# Patient Record
Sex: Female | Born: 1950 | Race: White | Hispanic: No | Smoking: Former smoker
Health system: Southern US, Community
[De-identification: ages and names within clinical notes are randomized; demographics above are authoritative.]

## PROBLEM LIST (undated history)

## (undated) DIAGNOSIS — T4145XA Adverse effect of unspecified anesthetic, initial encounter: Secondary | ICD-10-CM

## (undated) DIAGNOSIS — A159 Respiratory tuberculosis unspecified: Secondary | ICD-10-CM

## (undated) DIAGNOSIS — G629 Polyneuropathy, unspecified: Secondary | ICD-10-CM

## (undated) DIAGNOSIS — J45909 Unspecified asthma, uncomplicated: Secondary | ICD-10-CM

## (undated) DIAGNOSIS — F32A Depression, unspecified: Secondary | ICD-10-CM

## (undated) DIAGNOSIS — R51 Headache: Secondary | ICD-10-CM

## (undated) DIAGNOSIS — D369 Benign neoplasm, unspecified site: Secondary | ICD-10-CM

## (undated) DIAGNOSIS — T8859XA Other complications of anesthesia, initial encounter: Secondary | ICD-10-CM

## (undated) DIAGNOSIS — K449 Diaphragmatic hernia without obstruction or gangrene: Secondary | ICD-10-CM

## (undated) DIAGNOSIS — K219 Gastro-esophageal reflux disease without esophagitis: Secondary | ICD-10-CM

## (undated) DIAGNOSIS — R443 Hallucinations, unspecified: Secondary | ICD-10-CM

## (undated) DIAGNOSIS — R0602 Shortness of breath: Secondary | ICD-10-CM

## (undated) DIAGNOSIS — F419 Anxiety disorder, unspecified: Secondary | ICD-10-CM

## (undated) DIAGNOSIS — F909 Attention-deficit hyperactivity disorder, unspecified type: Secondary | ICD-10-CM

## (undated) DIAGNOSIS — M1990 Unspecified osteoarthritis, unspecified site: Secondary | ICD-10-CM

## (undated) DIAGNOSIS — K5790 Diverticulosis of intestine, part unspecified, without perforation or abscess without bleeding: Secondary | ICD-10-CM

## (undated) DIAGNOSIS — F329 Major depressive disorder, single episode, unspecified: Secondary | ICD-10-CM

## (undated) DIAGNOSIS — I10 Essential (primary) hypertension: Secondary | ICD-10-CM

## (undated) DIAGNOSIS — Z8489 Family history of other specified conditions: Secondary | ICD-10-CM

## (undated) HISTORY — DX: Unspecified asthma, uncomplicated: J45.909

## (undated) HISTORY — DX: Essential (primary) hypertension: I10

## (undated) HISTORY — PX: NOSE SURGERY: SHX723

## (undated) HISTORY — DX: Benign neoplasm, unspecified site: D36.9

## (undated) HISTORY — PX: TONSILLECTOMY: SUR1361

## (undated) HISTORY — PX: CARDIAC CATHETERIZATION: SHX172

## (undated) HISTORY — DX: Unspecified osteoarthritis, unspecified site: M19.90

## (undated) HISTORY — PX: OTHER SURGICAL HISTORY: SHX169

## (undated) HISTORY — DX: Diaphragmatic hernia without obstruction or gangrene: K44.9

## (undated) HISTORY — DX: Hallucinations, unspecified: R44.3

## (undated) HISTORY — DX: Depression, unspecified: F32.A

## (undated) HISTORY — DX: Anxiety disorder, unspecified: F41.9

## (undated) HISTORY — PX: SPINE SURGERY: SHX786

## (undated) HISTORY — DX: Major depressive disorder, single episode, unspecified: F32.9

## (undated) HISTORY — PX: JOINT REPLACEMENT: SHX530

## (undated) HISTORY — PX: TOE SURGERY: SHX1073

## (undated) HISTORY — DX: Diverticulosis of intestine, part unspecified, without perforation or abscess without bleeding: K57.90

## (undated) HISTORY — PX: CHOLECYSTECTOMY: SHX55

## (undated) HISTORY — PX: ABDOMINAL HYSTERECTOMY: SHX81

## (undated) HISTORY — PX: REPLACEMENT TOTAL KNEE: SUR1224

---

## 1996-09-26 HISTORY — PX: BREAST BIOPSY: SHX20

## 1999-08-18 ENCOUNTER — Encounter: Payer: Self-pay | Admitting: *Deleted

## 1999-08-18 ENCOUNTER — Emergency Department (HOSPITAL_COMMUNITY): Admission: EM | Admit: 1999-08-18 | Discharge: 1999-08-18 | Payer: Self-pay | Admitting: *Deleted

## 2000-08-16 ENCOUNTER — Encounter: Payer: Self-pay | Admitting: Family Medicine

## 2000-08-16 ENCOUNTER — Ambulatory Visit (HOSPITAL_COMMUNITY): Admission: RE | Admit: 2000-08-16 | Discharge: 2000-08-16 | Payer: Self-pay | Admitting: Family Medicine

## 2001-01-16 ENCOUNTER — Encounter: Payer: Self-pay | Admitting: Family Medicine

## 2001-01-16 ENCOUNTER — Encounter: Admission: RE | Admit: 2001-01-16 | Discharge: 2001-01-16 | Payer: Self-pay | Admitting: Family Medicine

## 2001-01-23 ENCOUNTER — Encounter: Payer: Self-pay | Admitting: Family Medicine

## 2001-01-23 ENCOUNTER — Encounter: Admission: RE | Admit: 2001-01-23 | Discharge: 2001-01-23 | Payer: Self-pay | Admitting: Family Medicine

## 2001-01-31 ENCOUNTER — Encounter: Admission: RE | Admit: 2001-01-31 | Discharge: 2001-01-31 | Payer: Self-pay | Admitting: General Surgery

## 2001-01-31 ENCOUNTER — Encounter: Payer: Self-pay | Admitting: General Surgery

## 2001-02-12 ENCOUNTER — Observation Stay (HOSPITAL_COMMUNITY): Admission: RE | Admit: 2001-02-12 | Discharge: 2001-02-13 | Payer: Self-pay | Admitting: General Surgery

## 2001-02-12 ENCOUNTER — Encounter (INDEPENDENT_AMBULATORY_CARE_PROVIDER_SITE_OTHER): Payer: Self-pay

## 2001-11-29 ENCOUNTER — Encounter: Admission: RE | Admit: 2001-11-29 | Discharge: 2001-11-29 | Payer: Self-pay | Admitting: Family Medicine

## 2001-11-29 ENCOUNTER — Encounter: Payer: Self-pay | Admitting: Family Medicine

## 2003-05-12 ENCOUNTER — Encounter: Admission: RE | Admit: 2003-05-12 | Discharge: 2003-08-10 | Payer: Self-pay | Admitting: *Deleted

## 2003-05-25 ENCOUNTER — Encounter: Payer: Self-pay | Admitting: Family Medicine

## 2003-05-25 ENCOUNTER — Encounter: Admission: RE | Admit: 2003-05-25 | Discharge: 2003-05-25 | Payer: Self-pay | Admitting: Family Medicine

## 2005-03-30 ENCOUNTER — Other Ambulatory Visit: Admission: RE | Admit: 2005-03-30 | Discharge: 2005-03-30 | Payer: Self-pay | Admitting: *Deleted

## 2005-03-31 ENCOUNTER — Encounter: Admission: RE | Admit: 2005-03-31 | Discharge: 2005-03-31 | Payer: Self-pay | Admitting: *Deleted

## 2006-01-04 ENCOUNTER — Ambulatory Visit (HOSPITAL_COMMUNITY): Admission: RE | Admit: 2006-01-04 | Discharge: 2006-01-04 | Payer: Self-pay | Admitting: *Deleted

## 2006-01-10 ENCOUNTER — Emergency Department (HOSPITAL_COMMUNITY): Admission: EM | Admit: 2006-01-10 | Discharge: 2006-01-11 | Payer: Self-pay | Admitting: Emergency Medicine

## 2006-01-24 ENCOUNTER — Ambulatory Visit: Payer: Self-pay | Admitting: Internal Medicine

## 2006-02-22 ENCOUNTER — Ambulatory Visit: Payer: Self-pay | Admitting: Internal Medicine

## 2008-02-28 ENCOUNTER — Telehealth: Payer: Self-pay | Admitting: Internal Medicine

## 2008-03-13 ENCOUNTER — Ambulatory Visit: Payer: Self-pay | Admitting: Internal Medicine

## 2008-03-20 ENCOUNTER — Telehealth (INDEPENDENT_AMBULATORY_CARE_PROVIDER_SITE_OTHER): Payer: Self-pay | Admitting: *Deleted

## 2008-03-21 ENCOUNTER — Ambulatory Visit (HOSPITAL_COMMUNITY): Admission: RE | Admit: 2008-03-21 | Discharge: 2008-03-21 | Payer: Self-pay | Admitting: Internal Medicine

## 2008-03-21 ENCOUNTER — Ambulatory Visit: Payer: Self-pay | Admitting: Internal Medicine

## 2008-03-21 DIAGNOSIS — D369 Benign neoplasm, unspecified site: Secondary | ICD-10-CM

## 2008-03-21 HISTORY — DX: Benign neoplasm, unspecified site: D36.9

## 2008-03-24 ENCOUNTER — Ambulatory Visit: Payer: Self-pay | Admitting: Internal Medicine

## 2008-12-05 ENCOUNTER — Encounter: Admission: RE | Admit: 2008-12-05 | Discharge: 2008-12-05 | Payer: Self-pay | Admitting: Otolaryngology

## 2009-03-17 ENCOUNTER — Emergency Department (HOSPITAL_BASED_OUTPATIENT_CLINIC_OR_DEPARTMENT_OTHER): Admission: EM | Admit: 2009-03-17 | Discharge: 2009-03-17 | Payer: Self-pay | Admitting: Emergency Medicine

## 2009-05-10 ENCOUNTER — Emergency Department (HOSPITAL_BASED_OUTPATIENT_CLINIC_OR_DEPARTMENT_OTHER): Admission: EM | Admit: 2009-05-10 | Discharge: 2009-05-11 | Payer: Self-pay | Admitting: Emergency Medicine

## 2009-05-11 ENCOUNTER — Ambulatory Visit: Payer: Self-pay | Admitting: Diagnostic Radiology

## 2009-06-11 DIAGNOSIS — I10 Essential (primary) hypertension: Secondary | ICD-10-CM

## 2009-06-11 DIAGNOSIS — E785 Hyperlipidemia, unspecified: Secondary | ICD-10-CM

## 2009-06-11 HISTORY — DX: Essential (primary) hypertension: I10

## 2009-06-11 HISTORY — DX: Hyperlipidemia, unspecified: E78.5

## 2009-06-12 ENCOUNTER — Ambulatory Visit: Payer: Self-pay | Admitting: Pulmonary Disease

## 2009-06-12 DIAGNOSIS — R05 Cough: Secondary | ICD-10-CM

## 2009-06-12 DIAGNOSIS — J309 Allergic rhinitis, unspecified: Secondary | ICD-10-CM

## 2009-06-12 DIAGNOSIS — R519 Headache, unspecified: Secondary | ICD-10-CM | POA: Insufficient documentation

## 2009-06-12 DIAGNOSIS — R51 Headache: Secondary | ICD-10-CM | POA: Insufficient documentation

## 2009-06-12 DIAGNOSIS — R059 Cough, unspecified: Secondary | ICD-10-CM | POA: Insufficient documentation

## 2009-06-12 HISTORY — DX: Allergic rhinitis, unspecified: J30.9

## 2009-06-12 HISTORY — DX: Cough, unspecified: R05.9

## 2009-06-26 ENCOUNTER — Ambulatory Visit: Payer: Self-pay | Admitting: Pulmonary Disease

## 2009-06-26 DIAGNOSIS — R0609 Other forms of dyspnea: Secondary | ICD-10-CM | POA: Insufficient documentation

## 2009-06-26 DIAGNOSIS — R0989 Other specified symptoms and signs involving the circulatory and respiratory systems: Secondary | ICD-10-CM | POA: Insufficient documentation

## 2009-06-26 HISTORY — DX: Other forms of dyspnea: R09.89

## 2009-06-26 HISTORY — DX: Other forms of dyspnea: R06.09

## 2009-07-01 ENCOUNTER — Encounter: Payer: Self-pay | Admitting: Pulmonary Disease

## 2009-07-10 ENCOUNTER — Ambulatory Visit (HOSPITAL_COMMUNITY): Admission: RE | Admit: 2009-07-10 | Discharge: 2009-07-10 | Payer: Self-pay | Admitting: Pulmonary Disease

## 2009-07-20 ENCOUNTER — Ambulatory Visit: Payer: Self-pay | Admitting: Pulmonary Disease

## 2009-07-29 ENCOUNTER — Emergency Department (HOSPITAL_BASED_OUTPATIENT_CLINIC_OR_DEPARTMENT_OTHER): Admission: EM | Admit: 2009-07-29 | Discharge: 2009-07-29 | Payer: Self-pay | Admitting: Emergency Medicine

## 2009-07-29 ENCOUNTER — Ambulatory Visit: Payer: Self-pay | Admitting: Diagnostic Radiology

## 2009-07-31 ENCOUNTER — Encounter: Payer: Self-pay | Admitting: Pulmonary Disease

## 2009-08-13 ENCOUNTER — Encounter: Payer: Self-pay | Admitting: Pulmonary Disease

## 2009-08-14 ENCOUNTER — Telehealth: Payer: Self-pay | Admitting: Pulmonary Disease

## 2009-08-26 ENCOUNTER — Telehealth: Payer: Self-pay | Admitting: Pulmonary Disease

## 2009-08-27 ENCOUNTER — Encounter: Payer: Self-pay | Admitting: Pulmonary Disease

## 2009-08-28 ENCOUNTER — Telehealth: Payer: Self-pay | Admitting: Pulmonary Disease

## 2009-09-01 ENCOUNTER — Encounter (INDEPENDENT_AMBULATORY_CARE_PROVIDER_SITE_OTHER): Payer: Self-pay | Admitting: *Deleted

## 2009-10-19 ENCOUNTER — Ambulatory Visit (HOSPITAL_COMMUNITY): Admission: RE | Admit: 2009-10-19 | Discharge: 2009-10-19 | Payer: Self-pay | Admitting: Surgery

## 2009-10-20 ENCOUNTER — Ambulatory Visit (HOSPITAL_COMMUNITY): Admission: RE | Admit: 2009-10-20 | Discharge: 2009-10-20 | Payer: Self-pay | Admitting: Gastroenterology

## 2009-11-06 ENCOUNTER — Ambulatory Visit (HOSPITAL_COMMUNITY): Admission: RE | Admit: 2009-11-06 | Discharge: 2009-11-06 | Payer: Self-pay | Admitting: Cardiology

## 2009-11-11 ENCOUNTER — Encounter: Admission: RE | Admit: 2009-11-11 | Discharge: 2009-11-11 | Payer: Self-pay | Admitting: Family Medicine

## 2010-09-03 ENCOUNTER — Inpatient Hospital Stay (HOSPITAL_COMMUNITY)
Admission: RE | Admit: 2010-09-03 | Discharge: 2010-09-06 | Payer: Self-pay | Source: Home / Self Care | Attending: Orthopedic Surgery | Admitting: Orthopedic Surgery

## 2010-10-17 ENCOUNTER — Encounter: Payer: Self-pay | Admitting: Surgery

## 2010-10-26 NOTE — Progress Notes (Signed)
Summary: results  Phone Note Call from Patient Call back at Home Phone [phone]   Caller: Patient Call For: clance Reason for Call: Talk to Nurse, Talk to Doctor Summary of Call: will have her phone with her all day today.  Please have KC call her about results. Initial call taken by: Eugene Gavia,  August 28, 2009 8:23 AM  Follow-up for Phone Call        pt states she will have her phone with her all day and you can reach her on number listed--[phone] Follow-up by: Philipp Deputy CMA,  August 28, 2009 11:40 AM  Additional Follow-up for Phone Call Additional follow up Details #1::        finally got to speak with pt.  Went over test results, and explained no pulmonary finding to explain her sob.  she is seeing gi to address probable reflux induced cough. Additional Follow-up by: Barbaraann Share MD,  August 28, 2009 4:35 PM

## 2010-10-26 NOTE — Letter (Signed)
Summary: Out of Work  [employer]  [address]   [place], [place] [address]   Phone: [phone]  Fax: [phone]    June 12, 2009   Employee:  JOHNNA BOLLIER Berkey    To Whom It May Concern:   For Medical reasons, please excuse the above named employee from work for the following dates:  Start:   Friday Sept 17, 2010  End:   Friday Sept 17, 2010  If you need additional information, please feel free to contact our office.         Sincerely,    Marcelyn Bruins, M.D.

## 2010-10-26 NOTE — Miscellaneous (Signed)
Summary: Orders Update pft charges  Clinical Lists Changes  Orders: Added new Service order of Carbon Monoxide diffusing w/capacity (94720) - Signed Added new Service order of Lung Volumes (94240) - Signed Added new Service order of Spirometry (Pre & Post) (94060) - Signed 

## 2010-10-26 NOTE — Progress Notes (Signed)
Summary: talk to dr clance  Phone Note Call from Patient   Caller: Patient Call For: clance Summary of Call: returning dr clance call Initial call taken by: Rickard Patience,  August 26, 2009 3:02 PM  Follow-up for Phone Call        Pt states she is returning Dr. Teddy Spike call.  Please advise.  Aundra Millet Reynolds LPN  August 26, 2009 3:03 PM   Additional Follow-up for Phone Call Additional follow up Details #1::        called pt and left message on machine that I called. Additional Follow-up by: Barbaraann Share MD,  August 26, 2009 6:04 PM

## 2010-10-26 NOTE — Letter (Signed)
Summary: [facility] Results Follow Up Letter  [facility]  [address]   [place], [place] [address]   Phone: [phone]  Fax: [phone]    09/01/2009 MRN: [id number]  Kortney Gedeon [address] [place], [place]  [address]  Dear Ms. Storie,  We have received the results from your recent tests and have been unable to contact you.  Please call our office at [phone] so that Dr.Clance or his nurse may review the results with you.    Thank you,  Nature conservation officer Pulmonary Division

## 2010-10-26 NOTE — Procedures (Signed)
Summary: Colonoscopy   Colonoscopy  Procedure date:  03/21/2008  Findings:      Location:  [facility].    Patient Name: Pamela, Sims MRN:  Procedure Procedures: Colonoscopy CPT: 09811.  Personnel: Endoscopist: Dora L. Juanda Chance, MD.  Exam Location: Exam performed in Endoscopy Suite.  Patient Consent: Procedure, Alternatives, Risks and Benefits discussed, consent obtained, from patient. Consent was obtained by the RN.  Indications  Surveillance of: Adenomatous Polyp(s). Initial polypectomy was performed in 2004. 1-2 Polyps were found at Index Exam. Prior polyp located in proximal (splenic flexure and beyond) colon. Pathology of worst  polyp: tubular adenoma.  History  Current Medications: Patient is not currently taking Coumadin.  Pre-Exam Physical: Performed Mar 21, 2008. Cardio-pulmonary exam, Rectal exam, HEENT exam , Abdominal exam, Extremity exam, Neurological exam, Mental status exam WNL.  Comments: Pt. history reviewed/updated, physical exam performed prior to initiation of sedation?yes Exam Exam: Extent of exam reached: Cecum, extent intended: Cecum.  The cecum was identified by appendiceal orifice and IC valve. Colon retroflexion performed. Images taken. ASA Classification: II. Tolerance: good.  Monitoring: Pulse and BP monitoring, Oximetry used. Supplemental O2 given.  Colon Prep Used Miralax for colon prep. Prep results: good.  Sedation Meds: Patient assessed and found to be appropriate for moderate (conscious) sedation. Fentanyl 60 given IV. Versed 6 mg. given IV.  Findings - NORMAL EXAM: Cecum. Comments: no recurrent polyp.  - DIVERTICULOSIS: Sigmoid Colon. ICD9: Diverticulosis, Colon: 562.10. Comments: mild sigmoid diverticulosis.  - NORMAL EXAM: Rectum.   Assessment Normal examination.  Diagnoses: 562.10: Diverticulosis, Colon.   Comments: no recurrent polyps Events  Unplanned Interventions: No intervention was required.    Unplanned Events: There were no complications. Plans Patient Education: Patient given standard instructions for: Patient instructed to get routine colonoscopy every 7-10 years.  Comments: stool cards every 2-3 years Disposition: After procedure patient sent to recovery.     This report was created from the original endoscopy report, which was reviewed and signed by the above listed endoscopist.     Appended Document: Colonoscopy Recall in IDX for 02/2015.

## 2010-10-26 NOTE — Letter (Signed)
Summary: [facility]  [facility]   Imported By: Maryln Gottron 09/01/2009 15:20:28  _____________________________________________________________________  External Attachment:    Type:   Image     Comment:   External Document

## 2010-10-26 NOTE — Miscellaneous (Signed)
Summary: pfts normal  Clinical Lists Changes  pfts normal.  Still have not gotten ENT eval of upper airway.  Called and left message at home number to call us with a number where she can be reached easily during day , and I will return her call.  Appended Document: pfts normal megan, please see if byers saw this pt, and have note faxed over.  thanks.  Appended Document: pfts normal received records and put in [facility] very important look at folder.   Appended Document: pfts normal reviewed and sent for scanning.

## 2010-10-26 NOTE — Progress Notes (Signed)
Summary: Procedure date changed  Phone Note Call from Patient Call back at Home Phone [phone]   Caller: Patient Call For: [name] Reason for Call: Talk to Nurse Summary of Call: [name] PT.  WANTS TO CHANGE FROM [name] TO ANY OTHER DR DUE TO [name]'S LIMITED SCHEDULE AVAILABILITY.  OK?  PT NEEDS RECALL COL DONE PRIOR TO JULY 1 DUE TO INSURANCE.  PATIENT'S CHART HAS BEEN REQUESTED Initial call taken by: Magdalen Spatz [facility],  February 28, 2008 11:28 AM  Follow-up for Phone Call        Mesage left for patient to callback. Laureen Ochs LPN  February 28, 1307 11:40 AM  PT RETURNED DEBORAH'S CALL.  DEBORAH NOT AVAILABLE.  PLEASE CALL PT AGAIN ON CELL PHONE.  SHE IS ON HER WAY TO ANOTHER DR APPT IF NO ANSWER, LEAVE ANOTHER MESSAGE PLEASE.  Magdalen Spatz [facility]  February 28, 2008 3:04 PM  Mesage left for patient to callback. Laureen Ochs LPN  February 27, 6577 3:11 PM   Follow-up by: Laureen Ochs LPN,  February 28, 4695 4:32 PM  Additional Follow-up for Phone Call Additional follow up Details #1::        Doesn't really want to change MD's, just needs her colon done before 03-26-08. Appt. rescheduled with pt. and she is satisified. 1) pre visit   03-13-08 at 4pm 2) colon [facility] 03-21-08 at 10am 3) Pt. instructed to call back as needed.  Additional Follow-up by: Laureen Ochs LPN,  February 28, 2951 4:46 PM

## 2010-10-26 NOTE — Progress Notes (Signed)
Summary: pt returned call x 2  Phone Note Call from Patient   Caller: Patient Call For: clance Summary of Call: pt returned call from kc. left phone contact info. call after 3pm: [phone] Initial call taken by: Tivis Ringer,  August 14, 2009 10:11 AM  Follow-up for Phone Call        pt called back again re: results. Tivis Ringer  August 17, 2009 2:28 PM  Additional Follow-up for Phone Call Additional follow up Details #1::        called pt again and lmom will try to call back. Additional Follow-up by: Barbaraann Share MD,  August 25, 2009 10:00 AM

## 2010-10-26 NOTE — Assessment & Plan Note (Signed)
Summary: consult for chronic cough   Copy to:  Merri Brunette Primary Provider/Referring Provider:  Renato Gails  CC:  Pulmonary Consult.  History of Present Illness: The pt is a 60 y/o female who I have been asked to see for chronic cough.  She developed a dry hacking cough in June of this year, and it has worsened to the point of causing sob.  She was in the ER one month ago with cough, sob, and myalgias.  Cxr at the time unremarkable, and ct chest unremarkable as well.  Given a neb treatment, and sent home with dx of "acute viral illness".  She was seen  by Dr. Katrinka Blazing with persistent cough, and was also having sob to the point that she would get winded walking down the hall at school and also with tallking.  She was treated with a zpak, and also started on qday nexium for reflux and ongoing GI issues.  Her symptoms did not improve, and therefore was given a trial of advair, that she has taken for 3 days only.  Has not seen a considerable difference.  Currently, the pt has sob just walking short distances, and feels a fullness in her throat.  The cough has continued, and is dry and barking in nature.  The cough is worse with excessive voice use, and she admits to frequent throat clearing.  She has been off her reflux meds for 2 weeks and denies gerd symptoms, but has been nauseated.  She denies any ongoing sinus symptoms, but did have ct sinuses in 2007 with minimal right maxillary mucosal thickening.  She has been on an ACE inhibitor in the past, but quit taking the beginning of august.  Current Medications (verified): 1)  None  Allergies (verified): No Known Drug Allergies  Past History:  Past Medical History:  HEADACHE, CHRONIC (ICD-784.0) ALLERGIC RHINITIS (ICD-477.9) HYPERLIPIDEMIA (ICD-272.4) HYPERTENSION (ICD-401.9)    Past Surgical History: sinus surgery- growth removed 1996 cholecystectomy 2003 hysterectomy 1997 benign breast biopsy neck surgery - c5 & c6 1995 tonsillectomy at age  10 L foot surgery 2003 L knee surgery 2009  Family History: Reviewed history from 06/11/2009 and no changes required. emphysema: mother asthma: daughters, grandson, brother heart disease: maternal grandfather cancer: maternal grandmother (breast)  Social History: Reviewed history from 06/11/2009 and no changes required. Patient states former smoker. quit 1977. started at age 51.  2 to 3 ppd. pt is divorced and now single. pt has children. pt works as a Runner, broadcasting/film/video.   Review of Systems       The patient complains of shortness of breath with activity, shortness of breath at rest, non-productive cough, chest pain, indigestion, weight change, abdominal pain, difficulty swallowing, sore throat, tooth/dental problems, headaches, ear ache, anxiety, hand/feet swelling, and joint stiffness or pain.  The patient denies productive cough, coughing up blood, irregular heartbeats, acid heartburn, loss of appetite, nasal congestion/difficulty breathing through nose, sneezing, itching, depression, rash, change in color of mucus, and fever.    Vital Signs:  Patient profile:   60 year old female Height:      67 inches Weight:      245.25 pounds BMI:     38.55 O2 Sat:      96 % on Room air Temp:     97.7 degrees F oral Pulse rate:   76 / minute BP sitting:   160 / 98  (left arm) Cuff size:   large  Vitals Entered By: Arman Filter LPN (June 12, 2009 9:27 AM)  O2 Flow:  Room air CC: Pulmonary Consult Comments Medications reviewed with patient Arman Filter LPN  June 12, 2009 9:27 AM    Physical Exam  General:  ow female in nad Eyes:  PERRLA and EOMI.   Nose:  patent without discharge Mouth:  clear Neck:  no jvd, tmg, LN Lungs:  clear to auscultation no wheezing or rhonchi Heart:  rrr, no mrg Abdomen:  soft and nontender, bs+ Extremities:  no edema noted, pulses intact distally Neurologic:  alert and oriented, moves all 4.    Pulmonary Function Test Date:  06/12/2009 Gender: Female  Pre-Spirometry FVC    Value: 2.76 L/min   Pred: 3.51 L/min     % Pred: 78 % FEV1    Value: 2.09 L     Pred: 2.63 L     % Pred: 79 % FEV1/FVC  Value: 76 %     Evaluation: normal  Impression & Recommendations:  Problem # 1:  COUGH, CHRONIC (ICD-786.2) the pt has a chronic cough that I suspect is upper airway in origin.  It has cyclical features, and may be driven by ongoing reflux.  I do not think she has a pulmonary etiology for this, given her normal cxr, normal lung exam,  and normal spirometry today in the office.  I would like to treat her with the cyclical cough protocol to allow her upper airway inflammation to resolve, but would also like to restart high dose PPI therapy short term to help with reflux.  I am not sure how her dyspnea fits in with all of this.  If continues even though her cough resolves, may need full pfts and screening cardiac evaluation.  Medications Added to Medication List This Visit: 1)  Tussicaps 10-8 Mg Xr12h-cap (Hydrocod polst-chlorphen polst) .... One in am and pm 2)  Tessalon Perles 100 Mg Caps (Benzonatate) .... One to two by mouth 3-4 times daily 3)  Nexium 40 Mg Cpdr (Esomeprazole magnesium) .... One by mouth in am and pm  Other Orders: Consultation Level IV (47829) Spirometry w/Graph (94010)  Patient Instructions: 1)  see cyclical cough protocol 2)  nexium 40mg  am and pm for next 4 weeks 3)  followup with me in 2 weeks  Prescriptions: NEXIUM 40 MG  CPDR (ESOMEPRAZOLE MAGNESIUM) one by mouth in am and pm  #60 x 1   Entered and Authorized by:   Barbaraann Share MD   Signed by:   Barbaraann Share MD on 06/12/2009   Method used:   Print then Give to Patient   RxID:   [id number] TESSALON PERLES 100 MG  CAPS (BENZONATATE) One to two by mouth 3-4 times daily  #30 x 1   Entered and Authorized by:   Barbaraann Share MD   Signed by:   Barbaraann Share MD on 06/12/2009   Method used:   Print then Give to Patient   RxID:    [id number] TUSSICAPS 10-8 MG XR12H-CAP (HYDROCOD POLST-CHLORPHEN POLST) one in am and pm  #12 x 0   Entered and Authorized by:   Barbaraann Share MD   Signed by:   Barbaraann Share MD on 06/12/2009   Method used:   Print then Give to Patient   RxID:   [id number]

## 2010-10-26 NOTE — Miscellaneous (Signed)
Summary: Orders Update  Clinical Lists Changes  Orders: Added new Service order of No Show NS50 (NS50) - Signed 

## 2010-10-26 NOTE — Miscellaneous (Signed)
Summary: [facility] Previsit/prep  Clinical Lists Changes  Medications: Added new medication of MIRALAX   POWD (POLYETHYLENE GLYCOL 3350) As per prep  instructions. - Signed Added new medication of METOCLOPRAMIDE HCL 10 MG  TABS (METOCLOPRAMIDE HCL) As per prep instructions. - Signed Added new medication of DULCOLAX 5 MG  TBEC (BISACODYL) Day before procedure take 2 at 3pm and 2 at 8pm. - Signed Rx of MIRALAX   POWD (POLYETHYLENE GLYCOL 3350) As per prep  instructions.;  #255gm x 0;  Signed;  Entered by: Wyona Almas RN;  Authorized by: Hart Carwin MD;  Method used: Electronic Rx of METOCLOPRAMIDE HCL 10 MG  TABS (METOCLOPRAMIDE HCL) As per prep instructions.;  #2 x 0;  Signed;  Entered by: Wyona Almas RN;  Authorized by: Hart Carwin MD;  Method used: Electronic Rx of DULCOLAX 5 MG  TBEC (BISACODYL) Day before procedure take 2 at 3pm and 2 at 8pm.;  #4 x 0;  Signed;  Entered by: Wyona Almas RN;  Authorized by: Hart Carwin MD;  Method used: Electronic Observations: Added new observation of NKA: T (03/13/2008 16:08)    Prescriptions: DULCOLAX 5 MG  TBEC (BISACODYL) Day before procedure take 2 at 3pm and 2 at 8pm.  #4 x 0   Entered by:   Wyona Almas RN   Authorized by:   Hart Carwin MD   Signed by:   Wyona Almas RN on 03/13/2008   Method used:   Electronically sent to ...       [facility] [phone][facility]       [address]       [place], [place]  [address]       Ph: [phone]       Fax: [phone]   RxID:   [id number] METOCLOPRAMIDE HCL 10 MG  TABS (METOCLOPRAMIDE HCL) As per prep instructions.  #2 x 0   Entered by:   Wyona Almas RN   Authorized by:   Hart Carwin MD   Signed by:   Wyona Almas RN on 03/13/2008   Method used:   Electronically sent to ...       [facility] [phone]*       [address]       [place], [place]  [address]       Ph: [phone]       Fax: [phone]   RxID:    [id number] MIRALAX   POWD (POLYETHYLENE GLYCOL 3350) As per prep  instructions.  #255gm x 0   Entered by:   Wyona Almas RN   Authorized by:   Hart Carwin MD   Signed by:   Wyona Almas RN on 03/13/2008   Method used:   Electronically sent to ...       [facility] [phone]*       [address]       [place], [place]  [address]       Ph: [phone]       Fax: [phone]   RxID:   [id number]

## 2010-12-07 LAB — DIFFERENTIAL
Basophils Absolute: 0 10*3/uL (ref 0.0–0.1)
Basophils Relative: 1 % (ref 0–1)
Eosinophils Absolute: 0.1 10*3/uL (ref 0.0–0.7)
Eosinophils Relative: 1 % (ref 0–5)
Lymphocytes Relative: 17 % (ref 12–46)
Lymphs Abs: 1.1 10*3/uL (ref 0.7–4.0)
Monocytes Absolute: 0.4 10*3/uL (ref 0.1–1.0)
Monocytes Relative: 6 % (ref 3–12)
Neutro Abs: 5 10*3/uL (ref 1.7–7.7)
Neutrophils Relative %: 76 % (ref 43–77)

## 2010-12-07 LAB — CBC
HCT: 31.4 % — ABNORMAL LOW (ref 36.0–46.0)
HCT: 33.2 % — ABNORMAL LOW (ref 36.0–46.0)
HCT: 35.9 % — ABNORMAL LOW (ref 36.0–46.0)
HCT: 40.4 % (ref 36.0–46.0)
Hemoglobin: 11 g/dL — ABNORMAL LOW (ref 12.0–15.0)
Hemoglobin: 11.6 g/dL — ABNORMAL LOW (ref 12.0–15.0)
Hemoglobin: 12.5 g/dL (ref 12.0–15.0)
Hemoglobin: 13.9 g/dL (ref 12.0–15.0)
MCH: 29.2 pg (ref 26.0–34.0)
MCH: 29.4 pg (ref 26.0–34.0)
MCH: 29.5 pg (ref 26.0–34.0)
MCH: 29.5 pg (ref 26.0–34.0)
MCHC: 34.4 g/dL (ref 30.0–36.0)
MCHC: 34.8 g/dL (ref 30.0–36.0)
MCHC: 34.9 g/dL (ref 30.0–36.0)
MCHC: 35 g/dL (ref 30.0–36.0)
MCV: 84.2 fL (ref 78.0–100.0)
MCV: 84.3 fL (ref 78.0–100.0)
MCV: 84.7 fL (ref 78.0–100.0)
MCV: 84.9 fL (ref 78.0–100.0)
Platelets: 132 10*3/uL — ABNORMAL LOW (ref 150–400)
Platelets: 133 10*3/uL — ABNORMAL LOW (ref 150–400)
Platelets: 147 10*3/uL — ABNORMAL LOW (ref 150–400)
Platelets: 155 10*3/uL (ref 150–400)
RBC: 3.73 MIL/uL — ABNORMAL LOW (ref 3.87–5.11)
RBC: 3.94 MIL/uL (ref 3.87–5.11)
RBC: 4.24 MIL/uL (ref 3.87–5.11)
RBC: 4.76 MIL/uL (ref 3.87–5.11)
RDW: 13.2 % (ref 11.5–15.5)
RDW: 13.4 % (ref 11.5–15.5)
RDW: 13.4 % (ref 11.5–15.5)
RDW: 13.4 % (ref 11.5–15.5)
WBC: 6.7 10*3/uL (ref 4.0–10.5)
WBC: 8.9 10*3/uL (ref 4.0–10.5)
WBC: 9.2 10*3/uL (ref 4.0–10.5)
WBC: 9.8 10*3/uL (ref 4.0–10.5)

## 2010-12-07 LAB — BASIC METABOLIC PANEL
BUN: 10 mg/dL (ref 6–23)
BUN: 11 mg/dL (ref 6–23)
BUN: 14 mg/dL (ref 6–23)
BUN: 7 mg/dL (ref 6–23)
CO2: 29 mEq/L (ref 19–32)
CO2: 29 mEq/L (ref 19–32)
CO2: 30 mEq/L (ref 19–32)
CO2: 31 mEq/L (ref 19–32)
Calcium: 8.1 mg/dL — ABNORMAL LOW (ref 8.4–10.5)
Calcium: 8.6 mg/dL (ref 8.4–10.5)
Calcium: 8.9 mg/dL (ref 8.4–10.5)
Calcium: 9.6 mg/dL (ref 8.4–10.5)
Chloride: 102 mEq/L (ref 96–112)
Chloride: 104 mEq/L (ref 96–112)
Chloride: 96 mEq/L (ref 96–112)
Chloride: 99 mEq/L (ref 96–112)
Creatinine, Ser: 0.8 mg/dL (ref 0.4–1.2)
Creatinine, Ser: 0.88 mg/dL (ref 0.4–1.2)
Creatinine, Ser: 0.92 mg/dL (ref 0.4–1.2)
Creatinine, Ser: 0.93 mg/dL (ref 0.4–1.2)
GFR calc Af Amer: >60 mL/min (ref >60)
GFR calc Af Amer: >60 mL/min (ref >60)
GFR calc Af Amer: >60 mL/min (ref >60)
GFR calc Af Amer: >60 mL/min (ref >60)
GFR calc non Af Amer: >60 mL/min (ref >60)
GFR calc non Af Amer: >60 mL/min (ref >60)
GFR calc non Af Amer: >60 mL/min (ref >60)
GFR calc non Af Amer: >60 mL/min (ref >60)
Glucose, Bld: 103 mg/dL — ABNORMAL HIGH (ref 70–99)
Glucose, Bld: 112 mg/dL — ABNORMAL HIGH (ref 70–99)
Glucose, Bld: 122 mg/dL — ABNORMAL HIGH (ref 70–99)
Glucose, Bld: 131 mg/dL — ABNORMAL HIGH (ref 70–99)
Potassium: 3.1 mEq/L — ABNORMAL LOW (ref 3.5–5.1)
Potassium: 3.9 mEq/L (ref 3.5–5.1)
Potassium: 4 mEq/L (ref 3.5–5.1)
Potassium: 4.7 mEq/L (ref 3.5–5.1)
Sodium: 135 mEq/L (ref 135–145)
Sodium: 136 mEq/L (ref 135–145)
Sodium: 137 mEq/L (ref 135–145)
Sodium: 143 mEq/L (ref 135–145)

## 2010-12-07 LAB — URINALYSIS, ROUTINE W REFLEX MICROSCOPIC
Bilirubin Urine: NEGATIVE
Glucose, UA: NEGATIVE mg/dL
Hgb urine dipstick: NEGATIVE
Ketones, ur: NEGATIVE mg/dL
Nitrite: NEGATIVE
Protein, ur: NEGATIVE mg/dL
Specific Gravity, Urine: 1.016 (ref 1.005–1.030)
Urobilinogen, UA: 0.2 mg/dL (ref 0.0–1.0)
pH: 6 (ref 5.0–8.0)

## 2010-12-07 LAB — PROTIME-INR
INR: 1 (ref 0.00–1.49)
Prothrombin Time: 13.4 seconds (ref 11.6–15.2)

## 2010-12-07 LAB — TYPE AND SCREEN
ABO/RH(D): A POS
Antibody Screen: NEGATIVE

## 2010-12-07 LAB — ABO/RH: ABO/RH(D): A POS

## 2010-12-07 LAB — SURGICAL PCR SCREEN
MRSA, PCR: NEGATIVE
Staphylococcus aureus: NEGATIVE

## 2010-12-07 LAB — APTT: aPTT: 28 seconds (ref 24–37)

## 2010-12-29 LAB — BASIC METABOLIC PANEL
BUN: 12 mg/dL (ref 6–23)
CO2: 25 mEq/L (ref 19–32)
Calcium: 9 mg/dL (ref 8.4–10.5)
Chloride: 106 mEq/L (ref 96–112)
Creatinine, Ser: 0.8 mg/dL (ref 0.4–1.2)
GFR calc Af Amer: >60 mL/min (ref >60)
GFR calc non Af Amer: >60 mL/min (ref >60)
Glucose, Bld: 87 mg/dL (ref 70–99)
Potassium: 4.2 mEq/L (ref 3.5–5.1)
Sodium: 140 mEq/L (ref 135–145)

## 2010-12-29 LAB — CBC
HCT: 39.9 % (ref 36.0–46.0)
Hemoglobin: 13.7 g/dL (ref 12.0–15.0)
MCHC: 34.3 g/dL (ref 30.0–36.0)
MCV: 87.8 fL (ref 78.0–100.0)
Platelets: 173 10*3/uL (ref 150–400)
RBC: 4.55 MIL/uL (ref 3.87–5.11)
RDW: 13.2 % (ref 11.5–15.5)
WBC: 6.6 10*3/uL (ref 4.0–10.5)

## 2010-12-29 LAB — DIFFERENTIAL
Basophils Absolute: 0.2 10*3/uL — ABNORMAL HIGH (ref 0.0–0.1)
Basophils Relative: 3 % — ABNORMAL HIGH (ref 0–1)
Eosinophils Absolute: 0.1 10*3/uL (ref 0.0–0.7)
Eosinophils Relative: 1 % (ref 0–5)
Lymphocytes Relative: 16 % (ref 12–46)
Lymphs Abs: 1 10*3/uL (ref 0.7–4.0)
Monocytes Absolute: 0.6 10*3/uL (ref 0.1–1.0)
Monocytes Relative: 8 % (ref 3–12)
Neutro Abs: 4.7 10*3/uL (ref 1.7–7.7)
Neutrophils Relative %: 72 % (ref 43–77)

## 2010-12-29 LAB — POCT CARDIAC MARKERS
CKMB, poc: 1 ng/mL (ref 1.0–8.0)
CKMB, poc: <1 ng/mL — ABNORMAL LOW (ref 1.0–8.0)
Myoglobin, poc: 106 ng/mL (ref 12–200)
Myoglobin, poc: 65.6 ng/mL (ref 12–200)
Troponin i, poc: <0.05 ng/mL (ref 0.00–0.09)
Troponin i, poc: <0.05 ng/mL (ref 0.00–0.09)

## 2011-01-01 LAB — BASIC METABOLIC PANEL
BUN: 11 mg/dL (ref 6–23)
CO2: 27 mEq/L (ref 19–32)
Calcium: 9.2 mg/dL (ref 8.4–10.5)
Chloride: 104 mEq/L (ref 96–112)
Creatinine, Ser: 0.9 mg/dL (ref 0.4–1.2)
GFR calc Af Amer: >60 mL/min (ref >60)
GFR calc non Af Amer: >60 mL/min (ref >60)
Glucose, Bld: 90 mg/dL (ref 70–99)
Potassium: 3.5 mEq/L (ref 3.5–5.1)
Sodium: 138 mEq/L (ref 135–145)

## 2011-01-01 LAB — DIFFERENTIAL
Basophils Absolute: 0.1 10*3/uL (ref 0.0–0.1)
Basophils Relative: 1 % (ref 0–1)
Eosinophils Absolute: 0 10*3/uL (ref 0.0–0.7)
Eosinophils Relative: 0 % (ref 0–5)
Lymphocytes Relative: 7 % — ABNORMAL LOW (ref 12–46)
Lymphs Abs: 0.5 10*3/uL — ABNORMAL LOW (ref 0.7–4.0)
Monocytes Absolute: 0.4 10*3/uL (ref 0.1–1.0)
Monocytes Relative: 6 % (ref 3–12)
Neutro Abs: 6.3 10*3/uL (ref 1.7–7.7)
Neutrophils Relative %: 86 % — ABNORMAL HIGH (ref 43–77)

## 2011-01-01 LAB — CBC
HCT: 39.2 % (ref 36.0–46.0)
Hemoglobin: 13.7 g/dL (ref 12.0–15.0)
MCHC: 34.9 g/dL (ref 30.0–36.0)
MCV: 87.8 fL (ref 78.0–100.0)
Platelets: 134 10*3/uL — ABNORMAL LOW (ref 150–400)
RBC: 4.47 MIL/uL (ref 3.87–5.11)
RDW: 13.4 % (ref 11.5–15.5)
WBC: 7.3 10*3/uL (ref 4.0–10.5)

## 2011-01-01 LAB — D-DIMER, QUANTITATIVE: D-Dimer, Quant: 1.78 ug/mL-FEU — ABNORMAL HIGH (ref 0.00–0.48)

## 2011-02-11 NOTE — Op Note (Signed)
[facility]  Patient:    Pamela Sims, Pamela Sims               MRN: [id number] Proc. Date: 02/12/01 Adm. Date:  [id number] Attending:  Brandy Hale CC:         Charles A. Tenny Craw, M.D.   Operative Report  PREOPERATIVE DIAGNOSES:  Chronic cholecystitis with cholelithiasis.  POSTOPERATIVE DIAGNOSES:  Chronic cholecystitis with cholelithiasis.  OPERATION PERFORMED:  Laparoscopic cholecystectomy.  SURGEON:  Dr. Claud Kelp.  FIRST ASSISTANT:  Dr. Mosetta Anis.  INDICATIONS FOR PROCEDURE:  This is a 60 year old white female who has a five month history of intermittent episodes of epigastric and right upper quadrant pain and nausea. This is now occurring on a daily basis. She has had an ultrasound which shows tiny hemangioma of the right lobe of the liver, and multiple gallstones. Liver function tests are normal. Upper GI has been performed which shows a small sliding hiatal hernia and no reflux and no focal abnormalities. She is brought to the operating room electively for cholecystectomy.  OPERATIVE FINDINGS:  The gallbladder was discolored and chronically inflamed and had adhesions to it. Otherwise there were no focal abnormalities. The liver looked normal. The stomach and duodenum, small intestine and large intestine and peritoneal surfaces looked normal.  DESCRIPTION OF PROCEDURE:  Following the induction of general endotracheal anesthesia, the patients abdomen was prepped and draped in a sterile fashion. The 0.25% Marcaine with epinephrine was used as a local infiltration anesthetic. A vertically oriented incision was made in the lower rim of the umbilicus. The fascia was incised in the midline and the abdominal cavity entered under direct vision. A 10 mm Hasson trocar was inserted and secured with a pursestring suture of #0 Vicryl. Pneumoperitoneum was created. The video camera was inserted with visualization and findings as described  above. The gallbladder was elevated on retractors. Adhesions were taken down. We dissected out the cystic duct and cystic artery very carefully. We isolated the cystic artery as it went onto the anterior surface of the gallbladder, secured it with metal clips and divided it. We created a large window behind the cystic duct so that we could clearly visualize a critical view of the triangle of Calot. Once we had this visualization, we could clearly identify the junction of the cystic duct with the gallbladder and clearly identify the region of the common bile duct. The cystic duct was tiny in caliber. The cystic duct was secured with metal clips and divided. We found the posterior branch of the cystic artery as it went onto the gallbladder, isolated this and divided it between metal clips. The gallbladder was dissected from its bed with electrocautery, placed in the specimen bag and removed. The operative field was copiously irrigated with saline as was the subphrenic space. At the completion of the case, there was no bleeding and no bile leak whatsoever. The trocars were removed under direct vision. There was on bleeding from the trocar sites. The pneumoperitoneum was released. The fascia at the umbilicus was closed with #0 Vicryl sutures. The skin incision was closed with subcuticular sutures of 4-0 Vicryl and Steri-Strips. Clean bandages were placed and the patient taken to the recovery room in stable condition. Estimated blood loss was about 10 cc. Complications none. Sponge, needle and instrument counts were correct. DD:  02/12/01 TD:  02/12/01 Job: [id number]

## 2011-03-28 ENCOUNTER — Other Ambulatory Visit: Payer: Self-pay | Admitting: Internal Medicine

## 2011-03-28 DIAGNOSIS — M542 Cervicalgia: Secondary | ICD-10-CM

## 2011-04-01 ENCOUNTER — Ambulatory Visit
Admission: RE | Admit: 2011-04-01 | Discharge: 2011-04-01 | Disposition: A | Discharge status: Home / Self Care | Payer: BC Managed Care – PPO | Source: Ambulatory Visit | Attending: Internal Medicine | Admitting: Internal Medicine

## 2011-04-01 DIAGNOSIS — M542 Cervicalgia: Secondary | ICD-10-CM

## 2011-09-30 ENCOUNTER — Ambulatory Visit (INDEPENDENT_AMBULATORY_CARE_PROVIDER_SITE_OTHER): Payer: BC Managed Care – PPO

## 2011-09-30 DIAGNOSIS — I10 Essential (primary) hypertension: Secondary | ICD-10-CM

## 2011-09-30 DIAGNOSIS — R42 Dizziness and giddiness: Secondary | ICD-10-CM

## 2011-09-30 DIAGNOSIS — R209 Unspecified disturbances of skin sensation: Secondary | ICD-10-CM

## 2011-09-30 DIAGNOSIS — F411 Generalized anxiety disorder: Secondary | ICD-10-CM

## 2011-10-03 ENCOUNTER — Other Ambulatory Visit: Payer: Self-pay | Admitting: Family Medicine

## 2011-10-03 DIAGNOSIS — R42 Dizziness and giddiness: Secondary | ICD-10-CM

## 2011-10-06 ENCOUNTER — Ambulatory Visit (INDEPENDENT_AMBULATORY_CARE_PROVIDER_SITE_OTHER): Payer: BC Managed Care – PPO

## 2011-10-06 ENCOUNTER — Ambulatory Visit
Admission: RE | Admit: 2011-10-06 | Discharge: 2011-10-06 | Disposition: A | Discharge status: Home / Self Care | Payer: No Typology Code available for payment source | Source: Ambulatory Visit | Attending: Family Medicine | Admitting: Family Medicine

## 2011-10-06 DIAGNOSIS — N390 Urinary tract infection, site not specified: Secondary | ICD-10-CM

## 2011-10-06 DIAGNOSIS — R42 Dizziness and giddiness: Secondary | ICD-10-CM

## 2011-10-06 DIAGNOSIS — R319 Hematuria, unspecified: Secondary | ICD-10-CM

## 2011-10-06 DIAGNOSIS — R109 Unspecified abdominal pain: Secondary | ICD-10-CM

## 2011-10-06 MED ORDER — GADOBENATE DIMEGLUMINE 529 MG/ML IV SOLN
20.0000 mL | Freq: Once | INTRAVENOUS | Status: AC | PRN
  Administered 2011-10-06: 20 mL via INTRAVENOUS

## 2011-10-17 ENCOUNTER — Other Ambulatory Visit (INDEPENDENT_AMBULATORY_CARE_PROVIDER_SITE_OTHER): Payer: BC Managed Care – PPO

## 2011-10-17 DIAGNOSIS — N390 Urinary tract infection, site not specified: Secondary | ICD-10-CM

## 2011-10-25 ENCOUNTER — Other Ambulatory Visit: Payer: Self-pay | Admitting: Physician Assistant

## 2011-10-25 DIAGNOSIS — R42 Dizziness and giddiness: Secondary | ICD-10-CM

## 2011-11-09 ENCOUNTER — Telehealth: Payer: Self-pay

## 2011-11-09 NOTE — Telephone Encounter (Signed)
Tried to fax records to [phone] three times but unsuccessful. Called [facility] and spoke with Eustaquio Maize and was given the pharmacy fax number [phone]). Faxed records to that number successfully to Diane's attention.

## 2011-11-09 NOTE — Telephone Encounter (Signed)
Diane from [facility] needs ov notes labs etc faxed to [phone] notes are not in epic the ov is from the 28th if I am not mistaken

## 2011-11-09 NOTE — Telephone Encounter (Signed)
Diane at [facility] is requesting ov notes on patient.  Porfirio Oar referred her. FAX [phone]

## 2011-12-03 ENCOUNTER — Ambulatory Visit: Payer: BC Managed Care – PPO

## 2011-12-19 ENCOUNTER — Ambulatory Visit (INDEPENDENT_AMBULATORY_CARE_PROVIDER_SITE_OTHER): Payer: BC Managed Care – PPO | Admitting: Physician Assistant

## 2011-12-19 VITALS — BP 120/79 | HR 80 | Temp 98.6°F | Resp 16 | Ht 66.0 in | Wt 229.0 lb

## 2011-12-19 DIAGNOSIS — R059 Cough, unspecified: Secondary | ICD-10-CM

## 2011-12-19 DIAGNOSIS — R05 Cough: Secondary | ICD-10-CM

## 2011-12-19 DIAGNOSIS — J069 Acute upper respiratory infection, unspecified: Secondary | ICD-10-CM

## 2011-12-19 MED ORDER — HYDROCOD POLST-CHLORPHEN POLST 10-8 MG/5ML PO LQCR: 5.0000 mL | Freq: Two times a day (BID) | ORAL | Status: DC | PRN

## 2011-12-19 MED ORDER — CLARITHROMYCIN ER 500 MG PO TB24: 1000.0000 mg | ORAL_TABLET | Freq: Every day | ORAL | Status: AC

## 2011-12-19 MED ORDER — IPRATROPIUM BROMIDE 0.03 % NA SOLN: 2.0000 | Freq: Two times a day (BID) | NASAL | Status: AC

## 2011-12-19 NOTE — Patient Instructions (Signed)
Get lots of rest, drink at least 64 ounces of water daily.  Try Systane Balance eye drops to moisturize your eyes.

## 2011-12-19 NOTE — Progress Notes (Signed)
  Subjective:    Patient ID: Pamela Sims, female    DOB: [birth year], 61 y.o.   MRN: [id number]  HPI Presents with several days of nasal congestion, but unable to blow anything out, nonproductive cough, feeling bad in general. Delsym with minimal relief. No fever or chills. No GI or GU symptoms. No myalgias or arthralgias. Today her eyes have been more irritated/red-awoke with crusting on the lashes. Drainage is clear. No visual disturbance.  Review of Systems As above.    Objective:   Physical Exam Vital signs noted. Well-developed, well nourished obese white female who is awake, alert and oriented, in NAD. HEENT: [place]/AT, PERRL, EOMI.  Sclera with mild injection and conjunctiva are clear.  No drainage noted. No cresting on the lashes. EAC are patent, TMs are normal in appearance. Nasal mucosa is pink and moist. OP is clear. Neck: supple, no lymphadenopathy, thyromegaly. Mild tenderness in the tonsillar region. Heart: RRR, no murmur Lungs: CTA Extremities: no cyanosis, clubbing or edema. Skin: warm and dry without rash.       Assessment & Plan:   1. Acute upper respiratory infections of unspecified site  clarithromycin (BIAXIN XL) 500 MG 24 hr tablet, ipratropium (ATROVENT) 0.03 % nasal spray  2. Cough  chlorpheniramine-HYDROcodone (TUSSIONEX PENNKINETIC ER) 10-8 MG/5ML [facility]  supportive care. Reevaluate if symptoms worsen or persist.

## 2012-01-09 ENCOUNTER — Ambulatory Visit (INDEPENDENT_AMBULATORY_CARE_PROVIDER_SITE_OTHER): Payer: BC Managed Care – PPO | Admitting: Family Medicine

## 2012-01-09 VITALS — BP 137/83 | HR 96 | Temp 98.0°F | Resp 20 | Ht 66.0 in | Wt 229.0 lb

## 2012-01-09 DIAGNOSIS — J40 Bronchitis, not specified as acute or chronic: Secondary | ICD-10-CM

## 2012-01-09 MED ORDER — HYDROCOD POLST-CHLORPHEN POLST 10-8 MG/5ML PO LQCR: 5.0000 mL | Freq: Two times a day (BID) | ORAL | Status: DC | PRN

## 2012-01-09 MED ORDER — PREDNISONE 20 MG PO TABS: ORAL_TABLET | ORAL | Status: AC

## 2012-01-09 MED ORDER — CEFDINIR 300 MG PO CAPS: 300.0000 mg | ORAL_CAPSULE | Freq: Two times a day (BID) | ORAL | Status: AC

## 2012-01-09 NOTE — Progress Notes (Signed)
  Subjective:    Patient ID: Pamela Sims, female    DOB: 02/12/1951, 61 y.o.   MRN: [id number]  HPI 61 yo female with URI symptoms.  Seen 12/19/11 and since that time has not improved. Medicine didn't help at all.  Cough syrup helped her rest at night but otherwise no help.  No fever when measured but subjective fevers and chills.  Cough is non-productive.  PND but no frontal nasal discharge.  Still some eye watering.    12/19/11 visit - biaxin, atrovent nasal, tussionex  Review of Systems Negative except as per HPI     Objective:   Physical Exam  Constitutional: She appears well-developed. No distress.       Frequent long, deep coughing fits  HENT:  Right Ear: Tympanic membrane, external ear and ear canal normal. Tympanic membrane is not injected, not scarred, not perforated, not erythematous, not retracted and not bulging.  Left Ear: Tympanic membrane, external ear and ear canal normal. Tympanic membrane is not injected, not scarred, not perforated, not erythematous, not retracted and not bulging.  Nose: No mucosal edema or rhinorrhea. Right sinus exhibits no maxillary sinus tenderness and no frontal sinus tenderness. Left sinus exhibits no maxillary sinus tenderness and no frontal sinus tenderness.  Mouth/Throat: Uvula is midline, oropharynx is clear and moist and mucous membranes are normal. No oropharyngeal exudate or tonsillar abscesses.  Cardiovascular: Normal rate, regular rhythm, normal heart sounds and intact distal pulses.   No murmur heard. Pulmonary/Chest: Effort normal and breath sounds normal. No respiratory distress. She has no wheezes. She has no rales.  Lymphadenopathy:       Head (right side): No submandibular and no preauricular adenopathy present.       Head (left side): No submandibular and no preauricular adenopathy present.       Right cervical: No superficial cervical and no posterior cervical adenopathy present.      Left cervical: No superficial cervical and  no posterior cervical adenopathy present.       Right: No supraclavicular adenopathy present.       Left: No supraclavicular adenopathy present.  Skin: Skin is warm and dry.          Assessment & Plan:  Bronchitis - pred taper, omnicef, and tussionex

## 2012-01-21 ENCOUNTER — Ambulatory Visit (INDEPENDENT_AMBULATORY_CARE_PROVIDER_SITE_OTHER): Payer: BC Managed Care – PPO | Admitting: Family Medicine

## 2012-01-21 VITALS — BP 143/84 | HR 91 | Temp 98.6°F | Resp 20 | Ht 66.0 in | Wt 232.0 lb

## 2012-01-21 DIAGNOSIS — R21 Rash and other nonspecific skin eruption: Secondary | ICD-10-CM

## 2012-01-21 MED ORDER — MUPIROCIN CALCIUM 2 % EX CREA: TOPICAL_CREAM | Freq: Three times a day (TID) | CUTANEOUS | Status: AC
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
## 2012-01-24 ENCOUNTER — Ambulatory Visit (INDEPENDENT_AMBULATORY_CARE_PROVIDER_SITE_OTHER): Payer: BC Managed Care – PPO | Admitting: Internal Medicine

## 2012-01-24 VITALS — BP 137/88 | HR 85 | Temp 98.3°F | Resp 18 | Ht 66.0 in | Wt 232.0 lb

## 2012-01-24 DIAGNOSIS — K130 Diseases of lips: Secondary | ICD-10-CM

## 2012-01-24 DIAGNOSIS — L0100 Impetigo, unspecified: Secondary | ICD-10-CM

## 2012-01-24 MED ORDER — CEPHALEXIN 500 MG PO CAPS: 500.0000 mg | ORAL_CAPSULE | Freq: Four times a day (QID) | ORAL | Status: AC
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
## 2012-01-27 ENCOUNTER — Telehealth: Payer: Self-pay

## 2012-01-27 NOTE — Telephone Encounter (Signed)
Pt would like to know if labs are in yet.

## 2012-01-28 LAB — WOUND CULTURE
Gram Stain: NONE SEEN
Gram Stain: NONE SEEN
Gram Stain: NONE SEEN
Organism ID, Bacteria: NO GROWTH

## 2012-01-30 NOTE — Telephone Encounter (Signed)
I called and left word for her culture was negative and that if she was worse we need to have her see a dermatologist and if she was responding and we just needed to finish medication she should call back if she needs a dermatology referral

## 2012-01-30 NOTE — Telephone Encounter (Signed)
Looks like wound culture is negative.  Ok to advise?

## 2012-01-31 ENCOUNTER — Telehealth: Payer: Self-pay

## 2012-01-31 DIAGNOSIS — R21 Rash and other nonspecific skin eruption: Secondary | ICD-10-CM

## 2012-01-31 NOTE — Telephone Encounter (Signed)
Pt said that when she was here last she had discussed with md that she could get referral to a dermatologist if mouth was not better she now wants that referral

## 2012-01-31 NOTE — Telephone Encounter (Signed)
Can you please do referral. Thanks

## 2012-02-01 NOTE — Telephone Encounter (Signed)
LMOM that referral has been started.

## 2012-02-01 NOTE — Telephone Encounter (Signed)
This lesion which was suspected impetigo did not respond to treatment and the culture was negative so I have referred her to Dr. Terri Piedra

## 2012-02-01 NOTE — Telephone Encounter (Signed)
Addended by: Tonye Pearson on: 02/01/2012 10:07 AM   Modules accepted: Orders

## 2012-02-05 ENCOUNTER — Encounter: Payer: Self-pay | Admitting: Internal Medicine

## 2012-02-05 NOTE — Progress Notes (Signed)
This encounter was created in error - please disregard.

## 2012-02-20 ENCOUNTER — Other Ambulatory Visit: Payer: Self-pay | Admitting: Physician Assistant

## 2012-02-21 ENCOUNTER — Ambulatory Visit (INDEPENDENT_AMBULATORY_CARE_PROVIDER_SITE_OTHER): Payer: BC Managed Care – PPO | Admitting: Family Medicine

## 2012-02-21 VITALS — BP 169/94 | HR 87 | Temp 98.6°F | Resp 18 | Wt 237.0 lb

## 2012-02-21 DIAGNOSIS — R21 Rash and other nonspecific skin eruption: Secondary | ICD-10-CM

## 2012-02-21 MED ORDER — PREDNISONE 20 MG PO TABS: ORAL_TABLET | ORAL | Status: AC

## 2012-02-21 NOTE — Progress Notes (Signed)
  Subjective:    Patient ID: Pamela Sims, female    DOB: 1950-11-15, 61 y.o.   MRN: [id number]  HPI 61 yo female with rash on lips.  Seen multiple times over last 6 weeks for same.  Ntes reviewed.  Has had several rounds of antibiotics.  Was sent to derm (DR. Terri Piedra) who took cultures - bacterial, fungal, and viral.  All negative.  Told her to try steroid cream.  If not better, come back for biopsy. New spot now popping up.  Hurts.  Crusts.  Red.   Has not had oral steroids.    Review of Systems    Negative except as per HPI  Objective:   Physical Exam  Constitutional: She appears well-developed.  Pulmonary/Chest: Effort normal.  Neurological: She is alert.  Skin:       Erythematous, flaky plaque on right upper lip, nostril, just crosses midline to left side.  No vesicles now (has had vesicles and oozing in the past).           Assessment & Plan:  RAsh of unknown etiology.  Try round of prednisone.  If not help, recommended she go back to Dr. Terri Piedra for the biopsy.

## 2012-05-01 ENCOUNTER — Other Ambulatory Visit: Payer: Self-pay | Admitting: Family Medicine

## 2012-05-01 ENCOUNTER — Other Ambulatory Visit: Payer: Self-pay | Admitting: Internal Medicine

## 2012-05-01 DIAGNOSIS — Z1231 Encounter for screening mammogram for malignant neoplasm of breast: Secondary | ICD-10-CM

## 2012-05-14 ENCOUNTER — Ambulatory Visit
Admission: RE | Admit: 2012-05-14 | Discharge: 2012-05-14 | Disposition: A | Discharge status: Home / Self Care | Payer: BC Managed Care – PPO | Source: Ambulatory Visit | Attending: Internal Medicine | Admitting: Internal Medicine

## 2012-05-14 DIAGNOSIS — Z1231 Encounter for screening mammogram for malignant neoplasm of breast: Secondary | ICD-10-CM

## 2012-06-28 ENCOUNTER — Other Ambulatory Visit: Payer: Self-pay | Admitting: Physician Assistant

## 2012-07-31 ENCOUNTER — Encounter (HOSPITAL_BASED_OUTPATIENT_CLINIC_OR_DEPARTMENT_OTHER): Payer: Self-pay

## 2012-07-31 ENCOUNTER — Emergency Department (HOSPITAL_BASED_OUTPATIENT_CLINIC_OR_DEPARTMENT_OTHER): Payer: Worker's Compensation

## 2012-07-31 ENCOUNTER — Emergency Department (HOSPITAL_BASED_OUTPATIENT_CLINIC_OR_DEPARTMENT_OTHER)
Admission: EM | Admit: 2012-07-31 | Discharge: 2012-07-31 | Disposition: A | Discharge status: Home / Self Care | Payer: Worker's Compensation | Attending: Emergency Medicine | Admitting: Emergency Medicine

## 2012-07-31 DIAGNOSIS — X500XXA Overexertion from strenuous movement or load, initial encounter: Secondary | ICD-10-CM | POA: Insufficient documentation

## 2012-07-31 DIAGNOSIS — Z79899 Other long term (current) drug therapy: Secondary | ICD-10-CM | POA: Insufficient documentation

## 2012-07-31 DIAGNOSIS — Z87891 Personal history of nicotine dependence: Secondary | ICD-10-CM | POA: Insufficient documentation

## 2012-07-31 DIAGNOSIS — Y990 Civilian activity done for income or pay: Secondary | ICD-10-CM | POA: Insufficient documentation

## 2012-07-31 DIAGNOSIS — IMO0002 Reserved for concepts with insufficient information to code with codable children: Secondary | ICD-10-CM | POA: Insufficient documentation

## 2012-07-31 DIAGNOSIS — I10 Essential (primary) hypertension: Secondary | ICD-10-CM | POA: Insufficient documentation

## 2012-07-31 DIAGNOSIS — F411 Generalized anxiety disorder: Secondary | ICD-10-CM | POA: Insufficient documentation

## 2012-07-31 DIAGNOSIS — Y9289 Other specified places as the place of occurrence of the external cause: Secondary | ICD-10-CM | POA: Insufficient documentation

## 2012-07-31 DIAGNOSIS — S43409A Unspecified sprain of unspecified shoulder joint, initial encounter: Secondary | ICD-10-CM

## 2012-07-31 NOTE — ED Notes (Signed)
Patient transported to X-ray ambulatory 
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
## 2012-11-11 ENCOUNTER — Other Ambulatory Visit: Payer: Self-pay | Admitting: Physician Assistant

## 2012-12-09 ENCOUNTER — Ambulatory Visit (INDEPENDENT_AMBULATORY_CARE_PROVIDER_SITE_OTHER): Payer: BC Managed Care – PPO | Admitting: Emergency Medicine

## 2012-12-09 VITALS — BP 140/92 | HR 82 | Temp 98.3°F | Resp 16 | Ht 66.0 in | Wt 220.0 lb

## 2012-12-09 DIAGNOSIS — I10 Essential (primary) hypertension: Secondary | ICD-10-CM

## 2012-12-09 MED ORDER — VALSARTAN-HYDROCHLOROTHIAZIDE 160-12.5 MG PO TABS: 1.0000 | ORAL_TABLET | Freq: Every day | ORAL | Status: DC

## 2012-12-09 NOTE — Patient Instructions (Signed)

## 2012-12-09 NOTE — Progress Notes (Signed)
[facility] [address], [place] [address] [phone]  Date:  12/09/2012   Name:  Pamela Sims   DOB:  1950/12/06   MRN:  [id number]  PCP:  Tonye Pearson, MD    Chief Complaint: Medication Refill, Headache and Tingling   History of Present Illness:  Pamela Sims is a 62 y.o. very pleasant female patient who presents with the following:  History of hypertension and has been off medication for 3 weeks. Says she didn't have money to come to the office to get a refill.  Took her blood pressure and was 170/90.  Now has a headache constantly for the past week and a half with no associated neuro symptoms.  No neuro or visual symptoms.  No difficulty with gait balance or coordination.  No speech disorder or facial asymmetry.  Patient Active Problem List  Diagnosis  . HYPERLIPIDEMIA  . HYPERTENSION  . ALLERGIC RHINITIS  . HEADACHE, CHRONIC  . DYSPNEA ON EXERTION  . COUGH, CHRONIC    Past Medical History  Diagnosis Date  . Anxiety   . Hypertension   . Hiatal hernia   . Depression   . Asthma     Past Surgical History  Procedure Laterality Date  . Cholecystectomy    . Spine surgery    . Abdominal hysterectomy    . Replacement total knee    . Tonsillectomy    . Nose surgery    . Foot surgery      History  Substance Use Topics  . Smoking status: Former Smoker -- 1.50 packs/day for 5 years    Quit date: 01/21/1976  . Smokeless tobacco: Never Used  . Alcohol Use: No    Family History  Problem Relation Age of Onset  . Hypertension Mother   . Emphysema Mother     smoker  . Hypertension Father   . Cancer Daughter   . Cancer Maternal Grandmother   . Heart disease Maternal Grandfather   . Stroke Paternal Grandmother   . Cancer Paternal Grandfather   . Emphysema Mother     No Known Allergies  Medication list has been reviewed and updated.  Current Outpatient Prescriptions on File Prior to Visit  Medication Sig Dispense  Refill  . Multiple Vitamin (MULTIVITAMIN) capsule Take 1 capsule by mouth daily.      . valsartan-hydrochlorothiazide (DIOVAN-HCT) 160-12.5 MG per tablet TAKE 1 TABLET BY MOUTH EVERY MORNING  90 tablet  0  . buPROPion (WELLBUTRIN SR) 150 MG 12 hr tablet Take 150 mg by mouth 3 (three) times daily.      Marland Kitchen buPROPion (WELLBUTRIN XL) 150 MG 24 hr tablet TAKE 3 TABLETS BY MOUTH EVERY MORNING  270 tablet  0  . chlorpheniramine-HYDROcodone (TUSSIONEX PENNKINETIC ER) 10-8 MG/5ML LQCR Take 5 mLs by mouth every 12 (twelve) hours as needed (cough).  60 mL  0  . chlorpheniramine-HYDROcodone (TUSSIONEX PENNKINETIC ER) 10-8 MG/5ML LQCR Take 5 mLs by mouth every 12 (twelve) hours as needed.  140 mL  0  . ipratropium (ATROVENT) 0.03 % nasal spray Place 2 sprays into the nose 2 (two) times daily.  30 mL  0   No current facility-administered medications on file prior to visit.    Review of Systems:  As per HPI, otherwise negative.    Physical Examination: Filed Vitals:   12/09/12 1217  BP: 140/92  Pulse: 82  Temp: 98.3 F (36.8 C)  Resp: 16   Filed Vitals:  12/09/12 1217  Height: 5\' 6"  (1.676 m)  Weight: 220 lb (99.791 kg)   Body mass index is 35.53 kg/(m^2). Ideal Body Weight: Weight in (lb) to have BMI = 25: 154.6  GEN: WDWN, NAD, Non-toxic, A & O x 3 HEENT: Atraumatic, Normocephalic. Neck supple. No masses, No LAD. Ears and Nose: No external deformity. CV: RRR, No M/G/R. No JVD. No thrill. No extra heart sounds. PULM: CTA B, no wheezes, crackles, rhonchi. No retractions. No resp. distress. No accessory muscle use. ABD: S, NT, ND, +BS. No rebound. No HSM. EXTR: No c/c/e NEURO Normal gait, balance, and coordination.  PRRERLA EOMI.  CN 2-12 intact.Marland Kitchen  PSYCH: Normally interactive. Conversant. Not depressed or anxious appearing.  Calm demeanor.    Assessment and Plan: Hypertension Noncompliance Continue medications Labs in three months  Carmelina Dane, MD

## 2013-01-02 ENCOUNTER — Other Ambulatory Visit: Payer: Self-pay | Admitting: Internal Medicine

## 2013-01-03 LAB — HEPATITIS B SURFACE ANTIGEN: Hepatitis B Surface Ag: NEGATIVE

## 2013-01-03 LAB — HIV ANTIBODY (ROUTINE TESTING W REFLEX): HIV: NONREACTIVE

## 2013-01-03 LAB — ALT: ALT: 14 U/L (ref 0–35)

## 2013-01-03 LAB — HEPATITIS C ANTIBODY: HCV Ab: NEGATIVE

## 2013-01-04 LAB — HEPATITIS B SURFACE ANTIBODY,QUALITATIVE: Hep B S Ab: NONREACTIVE

## 2013-01-07 ENCOUNTER — Telehealth: Payer: Self-pay

## 2013-01-07 NOTE — Telephone Encounter (Signed)
Pt is calling about her lab results. She hasn't heard anything back yet and was curious. Call back number is [phone]

## 2013-01-07 NOTE — Telephone Encounter (Signed)
See Result note.  Labs are normal/negative.

## 2013-01-08 NOTE — Telephone Encounter (Signed)
Thanks, I did not see the note. I called patient to advise.

## 2013-02-24 ENCOUNTER — Ambulatory Visit (INDEPENDENT_AMBULATORY_CARE_PROVIDER_SITE_OTHER): Payer: BC Managed Care – PPO | Admitting: Family Medicine

## 2013-02-24 VITALS — BP 140/84 | HR 78 | Temp 98.0°F | Resp 16 | Ht 66.25 in | Wt 221.4 lb

## 2013-02-24 DIAGNOSIS — R42 Dizziness and giddiness: Secondary | ICD-10-CM

## 2013-02-24 DIAGNOSIS — E559 Vitamin D deficiency, unspecified: Secondary | ICD-10-CM

## 2013-02-24 DIAGNOSIS — R231 Pallor: Secondary | ICD-10-CM

## 2013-02-24 DIAGNOSIS — I10 Essential (primary) hypertension: Secondary | ICD-10-CM

## 2013-02-24 DIAGNOSIS — M1990 Unspecified osteoarthritis, unspecified site: Secondary | ICD-10-CM

## 2013-02-24 DIAGNOSIS — E785 Hyperlipidemia, unspecified: Secondary | ICD-10-CM

## 2013-02-24 LAB — POCT UA - MICROSCOPIC ONLY
Casts, Ur, LPF, POC: NEGATIVE
Crystals, Ur, HPF, POC: NEGATIVE
Mucus, UA: NEGATIVE
Yeast, UA: NEGATIVE

## 2013-02-24 LAB — POCT CBC
Granulocyte percent: 76.3 %G (ref 37–80)
HCT, POC: 44.9 % (ref 37.7–47.9)
Hemoglobin: 14.1 g/dL (ref 12.2–16.2)
Lymph, poc: 1.4 (ref 0.6–3.4)
MCH, POC: 28.5 pg (ref 27–31.2)
MCHC: 31.4 g/dL — AB (ref 31.8–35.4)
MCV: 90.9 fL (ref 80–97)
MID (cbc): 0.4 (ref 0–0.9)
MPV: 10.1 fL (ref 0–99.8)
POC Granulocyte: 5.9 (ref 2–6.9)
POC LYMPH PERCENT: 18 %L (ref 10–50)
POC MID %: 5.7 %M (ref 0–12)
Platelet Count, POC: 192 10*3/uL (ref 142–424)
RBC: 4.94 M/uL (ref 4.04–5.48)
RDW, POC: 14.8 %
WBC: 7.7 10*3/uL (ref 4.6–10.2)

## 2013-02-24 LAB — COMPREHENSIVE METABOLIC PANEL
ALT: 15 U/L (ref 0–35)
AST: 14 U/L (ref 0–37)
Albumin: 3.9 g/dL (ref 3.5–5.2)
Alkaline Phosphatase: 64 U/L (ref 39–117)
BUN: 15 mg/dL (ref 6–23)
CO2: 27 mEq/L (ref 19–32)
Calcium: 9.3 mg/dL (ref 8.4–10.5)
Chloride: 107 mEq/L (ref 96–112)
Creat: 0.88 mg/dL (ref 0.50–1.10)
Glucose, Bld: 92 mg/dL (ref 70–99)
Potassium: 3.9 mEq/L (ref 3.5–5.3)
Sodium: 139 mEq/L (ref 135–145)
Total Bilirubin: 0.6 mg/dL (ref 0.3–1.2)
Total Protein: 6 g/dL (ref 6.0–8.3)

## 2013-02-24 LAB — LIPID PANEL
Cholesterol: 198 mg/dL (ref 0–200)
HDL: 54 mg/dL (ref >39)
LDL Cholesterol: 128 mg/dL — ABNORMAL HIGH (ref 0–99)
Total CHOL/HDL Ratio: 3.7 Ratio
Triglycerides: 78 mg/dL (ref <150)
VLDL: 16 mg/dL (ref 0–40)

## 2013-02-24 LAB — POCT URINALYSIS DIPSTICK
Bilirubin, UA: NEGATIVE
Blood, UA: NEGATIVE
Glucose, UA: NEGATIVE
Ketones, UA: NEGATIVE
Nitrite, UA: NEGATIVE
Protein, UA: NEGATIVE
Spec Grav, UA: 1.02
Urobilinogen, UA: 0.2
pH, UA: 7

## 2013-02-24 LAB — C-REACTIVE PROTEIN: CRP: 0.5 mg/dL (ref <0.60)

## 2013-02-24 LAB — TSH: TSH: 1.254 u[IU]/mL (ref 0.350–4.500)

## 2013-02-24 LAB — GLUCOSE, POCT (MANUAL RESULT ENTRY): POC Glucose: 81 mg/dl (ref 70–99)

## 2013-02-24 MED ORDER — VALSARTAN-HYDROCHLOROTHIAZIDE 320-25 MG PO TABS: 1.0000 | ORAL_TABLET | Freq: Every day | ORAL | Status: DC
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
## 2013-02-25 LAB — SEDIMENTATION RATE: Sed Rate: 4 mm/hr (ref 0–22)

## 2013-02-25 LAB — VITAMIN D 25 HYDROXY (VIT D DEFICIENCY, FRACTURES): Vit D, 25-Hydroxy: 44 ng/mL (ref 30–89)

## 2013-04-05 ENCOUNTER — Encounter (HOSPITAL_COMMUNITY): Payer: Self-pay | Admitting: Pharmacy Technician

## 2013-04-08 ENCOUNTER — Encounter (HOSPITAL_COMMUNITY)
Admission: RE | Admit: 2013-04-08 | Discharge: 2013-04-08 | Disposition: A | Discharge status: Home / Self Care | Payer: Worker's Compensation | Source: Ambulatory Visit | Attending: Orthopedic Surgery | Admitting: Orthopedic Surgery

## 2013-04-08 ENCOUNTER — Encounter (HOSPITAL_COMMUNITY): Payer: Self-pay

## 2013-04-08 DIAGNOSIS — Z01812 Encounter for preprocedural laboratory examination: Secondary | ICD-10-CM | POA: Insufficient documentation

## 2013-04-08 DIAGNOSIS — Z01811 Encounter for preprocedural respiratory examination: Secondary | ICD-10-CM | POA: Insufficient documentation

## 2013-04-08 DIAGNOSIS — Z01818 Encounter for other preprocedural examination: Secondary | ICD-10-CM | POA: Insufficient documentation

## 2013-04-08 HISTORY — DX: Adverse effect of unspecified anesthetic, initial encounter: T41.45XA

## 2013-04-08 HISTORY — DX: Other complications of anesthesia, initial encounter: T88.59XA

## 2013-04-08 HISTORY — DX: Respiratory tuberculosis unspecified: A15.9

## 2013-04-08 HISTORY — DX: Gastro-esophageal reflux disease without esophagitis: K21.9

## 2013-04-08 HISTORY — DX: Headache: R51

## 2013-04-08 HISTORY — DX: Attention-deficit hyperactivity disorder, unspecified type: F90.9

## 2013-04-08 LAB — BASIC METABOLIC PANEL
BUN: 16 mg/dL (ref 6–23)
CO2: 29 mEq/L (ref 19–32)
Calcium: 9.8 mg/dL (ref 8.4–10.5)
Chloride: 100 mEq/L (ref 96–112)
Creatinine, Ser: 0.92 mg/dL (ref 0.50–1.10)
GFR calc Af Amer: 77 mL/min — ABNORMAL LOW (ref >90)
GFR calc non Af Amer: 66 mL/min — ABNORMAL LOW (ref >90)
Glucose, Bld: 94 mg/dL (ref 70–99)
Potassium: 3.6 mEq/L (ref 3.5–5.1)
Sodium: 138 mEq/L (ref 135–145)

## 2013-04-08 LAB — TYPE AND SCREEN
ABO/RH(D): A POS
Antibody Screen: NEGATIVE

## 2013-04-08 LAB — CBC
HCT: 40.9 % (ref 36.0–46.0)
Hemoglobin: 14.4 g/dL (ref 12.0–15.0)
MCH: 29.3 pg (ref 26.0–34.0)
MCHC: 35.2 g/dL (ref 30.0–36.0)
MCV: 83.1 fL (ref 78.0–100.0)
Platelets: 163 10*3/uL (ref 150–400)
RBC: 4.92 MIL/uL (ref 3.87–5.11)
RDW: 13.5 % (ref 11.5–15.5)
WBC: 6.5 10*3/uL (ref 4.0–10.5)

## 2013-04-08 LAB — SURGICAL PCR SCREEN
MRSA, PCR: NEGATIVE
Staphylococcus aureus: NEGATIVE

## 2013-04-08 NOTE — Progress Notes (Signed)
REQUESTED LAST OFFICE NOTE, CARDIAC CATH , STRESS TEST , ECHO FROM [facility].

## 2013-04-08 NOTE — Pre-Procedure Instructions (Signed)
Pamela Sims  04/08/2013   Your procedure is scheduled on:   04/17/13 Wednesday    Report to [facility] at CALL AT 800 AM FOR ARRIVAL TIME  [phone] .  Call this number if you have problems the morning of surgery: [phone]   Remember:   Do not eat food or drink liquids after midnight.   Take these medicines the morning of surgery with A SIP OF WATER:  LEXAPRO,  CONCERTA IF NEEDED (STOP KRILL OIL )   Do not wear jewelry, make-up or nail polish.  Do not wear lotions, powders, or perfumes. You may wear deodorant.  Do not shave 48 hours prior to surgery. Men may shave face and neck.  Do not bring valuables to the hospital.  [facility] is not responsible                   for any belongings or valuables.  Contacts, dentures or bridgework may not be worn into surgery.  Leave suitcase in the car. After surgery it may be brought to your room.  For patients admitted to the hospital, checkout time is 11:00 AM the day of  discharge.   Patients discharged the day of surgery will not be allowed to drive  home.  Name and phone number of your driver:   Special Instructions: Shower using CHG 2 nights before surgery and the night before surgery.  If you shower the day of surgery use CHG.  Use special wash - you have one bottle of CHG for all showers.  You should use approximately 1/3 of the bottle for each shower.   Please read over the following fact sheets that you were given: Pain Booklet, Coughing and Deep Breathing, Blood Transfusion Information, Total Joint Packet, MRSA Information and Surgical Site Infection Prevention

## 2013-04-08 NOTE — Progress Notes (Signed)
Requested orders from Dr. Dietrich Pates office.
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
## 2013-04-16 MED ORDER — CEFAZOLIN SODIUM-DEXTROSE 2-3 GM-% IV SOLR
2.0000 g | INTRAVENOUS | Status: AC
  Administered 2013-04-17: 2 g via INTRAVENOUS
  Filled 2013-04-16: qty 50

## 2013-04-17 ENCOUNTER — Encounter (HOSPITAL_COMMUNITY)
Admission: RE | Disposition: A | Discharge status: Home Health | Payer: Self-pay | Source: Ambulatory Visit | Attending: Orthopedic Surgery

## 2013-04-17 ENCOUNTER — Ambulatory Visit (HOSPITAL_COMMUNITY): Payer: Worker's Compensation | Admitting: Certified Registered Nurse Anesthetist

## 2013-04-17 ENCOUNTER — Inpatient Hospital Stay (HOSPITAL_COMMUNITY): Payer: Worker's Compensation

## 2013-04-17 ENCOUNTER — Encounter (HOSPITAL_COMMUNITY): Payer: Self-pay | Admitting: Anesthesiology

## 2013-04-17 ENCOUNTER — Inpatient Hospital Stay (HOSPITAL_COMMUNITY)
Admission: RE | Admit: 2013-04-17 | Discharge: 2013-04-20 | DRG: 470 | Disposition: A | Discharge status: Home Health | Payer: Worker's Compensation | Source: Ambulatory Visit | Attending: Orthopedic Surgery | Admitting: Orthopedic Surgery

## 2013-04-17 ENCOUNTER — Encounter (HOSPITAL_COMMUNITY): Payer: Self-pay | Admitting: Certified Registered Nurse Anesthetist

## 2013-04-17 DIAGNOSIS — F329 Major depressive disorder, single episode, unspecified: Secondary | ICD-10-CM | POA: Diagnosis present

## 2013-04-17 DIAGNOSIS — Z96659 Presence of unspecified artificial knee joint: Secondary | ICD-10-CM

## 2013-04-17 DIAGNOSIS — F3289 Other specified depressive episodes: Secondary | ICD-10-CM | POA: Diagnosis present

## 2013-04-17 DIAGNOSIS — M1710 Unilateral primary osteoarthritis, unspecified knee: Principal | ICD-10-CM | POA: Diagnosis present

## 2013-04-17 DIAGNOSIS — F411 Generalized anxiety disorder: Secondary | ICD-10-CM | POA: Diagnosis present

## 2013-04-17 DIAGNOSIS — I10 Essential (primary) hypertension: Secondary | ICD-10-CM | POA: Diagnosis present

## 2013-04-17 DIAGNOSIS — Z87891 Personal history of nicotine dependence: Secondary | ICD-10-CM

## 2013-04-17 DIAGNOSIS — F909 Attention-deficit hyperactivity disorder, unspecified type: Secondary | ICD-10-CM | POA: Diagnosis present

## 2013-04-17 HISTORY — PX: TOTAL KNEE ARTHROPLASTY: SHX125

## 2013-04-17 LAB — PROTIME-INR
INR: 1.05 (ref 0.00–1.49)
Prothrombin Time: 13.5 seconds (ref 11.6–15.2)

## 2013-04-17 LAB — CBC
HCT: 35.2 % — ABNORMAL LOW (ref 36.0–46.0)
Hemoglobin: 12.5 g/dL (ref 12.0–15.0)
MCH: 30 pg (ref 26.0–34.0)
MCHC: 35.5 g/dL (ref 30.0–36.0)
MCV: 84.6 fL (ref 78.0–100.0)
Platelets: 150 10*3/uL (ref 150–400)
RBC: 4.16 MIL/uL (ref 3.87–5.11)
RDW: 13.8 % (ref 11.5–15.5)
WBC: 14.3 10*3/uL — ABNORMAL HIGH (ref 4.0–10.5)

## 2013-04-17 LAB — CREATININE, SERUM
Creatinine, Ser: 0.91 mg/dL (ref 0.50–1.10)
GFR calc Af Amer: 78 mL/min — ABNORMAL LOW (ref >90)
GFR calc non Af Amer: 67 mL/min — ABNORMAL LOW (ref >90)

## 2013-04-17 LAB — APTT: aPTT: 28 seconds (ref 24–37)

## 2013-04-17 SURGERY — ARTHROPLASTY, KNEE, TOTAL
Anesthesia: General | Site: Knee | Laterality: Right | Wound class: Clean

## 2013-04-17 MED ORDER — LIDOCAINE HCL 4 % MT SOLN
OROMUCOSAL | Status: DC | PRN
  Administered 2013-04-17: 4 mL via TOPICAL

## 2013-04-17 MED ORDER — OXYCODONE HCL 5 MG/5ML PO SOLN: 5.0000 mg | Freq: Once | ORAL | Status: AC | PRN

## 2013-04-17 MED ORDER — METHOCARBAMOL 500 MG PO TABS
ORAL_TABLET | ORAL | Status: AC
  Administered 2013-04-17: 500 mg via ORAL
  Filled 2013-04-17: qty 1

## 2013-04-17 MED ORDER — METHYLPHENIDATE HCL 5 MG PO TABS: 10.0000 mg | ORAL_TABLET | Freq: Two times a day (BID) | ORAL | Status: DC

## 2013-04-17 MED ORDER — CHLORHEXIDINE GLUCONATE 4 % EX LIQD: 60.0000 mL | Freq: Once | CUTANEOUS | Status: DC

## 2013-04-17 MED ORDER — OXYCODONE HCL 5 MG PO TABS
ORAL_TABLET | ORAL | Status: AC
  Administered 2013-04-17: 5 mg via ORAL
  Filled 2013-04-17: qty 1

## 2013-04-17 MED ORDER — METOCLOPRAMIDE HCL 5 MG/ML IJ SOLN: 10.0000 mg | Freq: Once | INTRAMUSCULAR | Status: DC | PRN

## 2013-04-17 MED ORDER — METOCLOPRAMIDE HCL 5 MG/ML IJ SOLN
5.0000 mg | Freq: Three times a day (TID) | INTRAMUSCULAR | Status: DC | PRN
  Administered 2013-04-18: 10 mg via INTRAVENOUS
  Filled 2013-04-17: qty 2

## 2013-04-17 MED ORDER — HYDROMORPHONE HCL PF 1 MG/ML IJ SOLN
INTRAMUSCULAR | Status: AC
  Administered 2013-04-17: 0.5 mg via INTRAVENOUS
  Filled 2013-04-17: qty 1

## 2013-04-17 MED ORDER — FERROUS SULFATE 325 (65 FE) MG PO TABS
325.0000 mg | ORAL_TABLET | Freq: Three times a day (TID) | ORAL | Status: DC
  Administered 2013-04-18 – 2013-04-20 (×5): 325 mg via ORAL
  Filled 2013-04-17 (×11): qty 1

## 2013-04-17 MED ORDER — SODIUM CHLORIDE 0.9 % IR SOLN
Status: DC | PRN
  Administered 2013-04-17: 3000 mL

## 2013-04-17 MED ORDER — LIDOCAINE HCL (CARDIAC) 20 MG/ML IV SOLN
INTRAVENOUS | Status: DC | PRN
  Administered 2013-04-17: 50 mg via INTRAVENOUS

## 2013-04-17 MED ORDER — METHYLPHENIDATE HCL ER (OSM) 18 MG PO TBCR
54.0000 mg | EXTENDED_RELEASE_TABLET | ORAL | Status: DC
  Administered 2013-04-18 – 2013-04-20 (×3): 54 mg via ORAL
  Filled 2013-04-17: qty 4
  Filled 2013-04-17: qty 2
  Filled 2013-04-17: qty 1
  Filled 2013-04-17: qty 3

## 2013-04-17 MED ORDER — ACETAMINOPHEN 650 MG RE SUPP: 650.0000 mg | Freq: Four times a day (QID) | RECTAL | Status: DC | PRN

## 2013-04-17 MED ORDER — HYDROMORPHONE HCL PF 1 MG/ML IJ SOLN
0.2500 mg | INTRAMUSCULAR | Status: DC | PRN
  Administered 2013-04-17 (×2): 0.5 mg via INTRAVENOUS

## 2013-04-17 MED ORDER — HYDROMORPHONE HCL PF 1 MG/ML IJ SOLN
1.0000 mg | INTRAMUSCULAR | Status: DC | PRN
  Administered 2013-04-17: 1 mg via INTRAVENOUS
  Administered 2013-04-17: 2 mg via INTRAVENOUS
  Administered 2013-04-18: 1 mg via INTRAVENOUS
  Administered 2013-04-18: 2 mg via INTRAVENOUS
  Filled 2013-04-17 (×2): qty 2
  Filled 2013-04-17 (×2): qty 1

## 2013-04-17 MED ORDER — ROPIVACAINE HCL 5 MG/ML IJ SOLN
INTRAMUSCULAR | Status: DC | PRN
  Administered 2013-04-17: 25 mL via EPIDURAL

## 2013-04-17 MED ORDER — LIDOCAINE HCL 1 % IJ SOLN
INTRAMUSCULAR | Status: DC | PRN
  Administered 2013-04-17: 2 mL via INTRADERMAL

## 2013-04-17 MED ORDER — PHENOL 1.4 % MT LIQD: 1.0000 | OROMUCOSAL | Status: DC | PRN

## 2013-04-17 MED ORDER — NEOSTIGMINE METHYLSULFATE 1 MG/ML IJ SOLN
INTRAMUSCULAR | Status: DC | PRN
  Administered 2013-04-17: 3 mg via INTRAVENOUS

## 2013-04-17 MED ORDER — MENTHOL 3 MG MT LOZG: 1.0000 | LOZENGE | OROMUCOSAL | Status: DC | PRN

## 2013-04-17 MED ORDER — OXYCODONE HCL 5 MG PO TABS
5.0000 mg | ORAL_TABLET | ORAL | Status: DC | PRN
  Administered 2013-04-18 – 2013-04-19 (×5): 10 mg via ORAL
  Administered 2013-04-20: 5 mg via ORAL
  Filled 2013-04-17: qty 2
  Filled 2013-04-17: qty 1
  Filled 2013-04-17 (×4): qty 2

## 2013-04-17 MED ORDER — ACETAMINOPHEN 325 MG PO TABS: 650.0000 mg | ORAL_TABLET | Freq: Four times a day (QID) | ORAL | Status: DC | PRN

## 2013-04-17 MED ORDER — FENTANYL CITRATE 0.05 MG/ML IJ SOLN
INTRAMUSCULAR | Status: AC
  Filled 2013-04-17: qty 2

## 2013-04-17 MED ORDER — ONDANSETRON HCL 4 MG/2ML IJ SOLN
INTRAMUSCULAR | Status: DC | PRN
  Administered 2013-04-17: 4 mg via INTRAVENOUS

## 2013-04-17 MED ORDER — OXYCODONE HCL 5 MG PO TABS: 5.0000 mg | ORAL_TABLET | Freq: Once | ORAL | Status: AC | PRN

## 2013-04-17 MED ORDER — ENOXAPARIN SODIUM 30 MG/0.3ML ~~LOC~~ SOLN
30.0000 mg | Freq: Two times a day (BID) | SUBCUTANEOUS | Status: DC
  Administered 2013-04-18 – 2013-04-20 (×5): 30 mg via SUBCUTANEOUS
  Filled 2013-04-17 (×6): qty 0.3

## 2013-04-17 MED ORDER — MIDAZOLAM HCL 2 MG/2ML IJ SOLN
INTRAMUSCULAR | Status: AC
  Administered 2013-04-17: 2 mg
  Filled 2013-04-17: qty 2

## 2013-04-17 MED ORDER — GLYCOPYRROLATE 0.2 MG/ML IJ SOLN
INTRAMUSCULAR | Status: DC | PRN
  Administered 2013-04-17: 0.4 mg via INTRAVENOUS

## 2013-04-17 MED ORDER — SODIUM CHLORIDE 0.9 % IV SOLN
INTRAVENOUS | Status: DC
  Administered 2013-04-17: 23:00:00 via INTRAVENOUS

## 2013-04-17 MED ORDER — ADULT MULTIVITAMIN W/MINERALS CH
1.0000 | ORAL_TABLET | Freq: Every day | ORAL | Status: DC
  Administered 2013-04-18 – 2013-04-20 (×3): 1 via ORAL
  Filled 2013-04-17 (×3): qty 1

## 2013-04-17 MED ORDER — FENTANYL CITRATE 0.05 MG/ML IJ SOLN
100.0000 ug | Freq: Once | INTRAMUSCULAR | Status: AC
  Administered 2013-04-17: 100 ug via INTRAVENOUS

## 2013-04-17 MED ORDER — CELECOXIB 200 MG PO CAPS
200.0000 mg | ORAL_CAPSULE | Freq: Two times a day (BID) | ORAL | Status: DC
  Administered 2013-04-18 – 2013-04-20 (×6): 200 mg via ORAL
  Filled 2013-04-17 (×7): qty 1

## 2013-04-17 MED ORDER — LACTATED RINGERS IV SOLN
INTRAVENOUS | Status: DC
  Administered 2013-04-17: 13:00:00 via INTRAVENOUS

## 2013-04-17 MED ORDER — LACTATED RINGERS IV SOLN
INTRAVENOUS | Status: DC | PRN
  Administered 2013-04-17 (×2): via INTRAVENOUS

## 2013-04-17 MED ORDER — VALSARTAN-HYDROCHLOROTHIAZIDE 320-25 MG PO TABS: 1.0000 | ORAL_TABLET | Freq: Every day | ORAL | Status: DC

## 2013-04-17 MED ORDER — 0.9 % SODIUM CHLORIDE (POUR BTL) OPTIME
TOPICAL | Status: DC | PRN
  Administered 2013-04-17: 1000 mL

## 2013-04-17 MED ORDER — ONDANSETRON HCL 4 MG PO TABS: 4.0000 mg | ORAL_TABLET | Freq: Four times a day (QID) | ORAL | Status: DC | PRN

## 2013-04-17 MED ORDER — WARFARIN - PHARMACIST DOSING INPATIENT: Freq: Every day | Status: DC

## 2013-04-17 MED ORDER — HYDROCHLOROTHIAZIDE 25 MG PO TABS
25.0000 mg | ORAL_TABLET | Freq: Every day | ORAL | Status: DC
  Administered 2013-04-18 – 2013-04-20 (×4): 25 mg via ORAL
  Filled 2013-04-17 (×4): qty 1

## 2013-04-17 MED ORDER — METHOCARBAMOL 100 MG/ML IJ SOLN
500.0000 mg | Freq: Four times a day (QID) | INTRAVENOUS | Status: DC | PRN
  Filled 2013-04-17: qty 5

## 2013-04-17 MED ORDER — ROCURONIUM BROMIDE 100 MG/10ML IV SOLN
INTRAVENOUS | Status: DC | PRN
  Administered 2013-04-17: 50 mg via INTRAVENOUS

## 2013-04-17 MED ORDER — COUMADIN BOOK
Freq: Once | Status: AC
  Administered 2013-04-17: 1
  Filled 2013-04-17: qty 1

## 2013-04-17 MED ORDER — EPHEDRINE SULFATE 50 MG/ML IJ SOLN
INTRAMUSCULAR | Status: DC | PRN
  Administered 2013-04-17: 10 mg via INTRAVENOUS

## 2013-04-17 MED ORDER — ONDANSETRON HCL 4 MG/2ML IJ SOLN
4.0000 mg | Freq: Four times a day (QID) | INTRAMUSCULAR | Status: DC | PRN
  Administered 2013-04-18: 4 mg via INTRAVENOUS
  Filled 2013-04-17: qty 2

## 2013-04-17 MED ORDER — METHOCARBAMOL 500 MG PO TABS
500.0000 mg | ORAL_TABLET | Freq: Four times a day (QID) | ORAL | Status: DC | PRN
  Administered 2013-04-18 – 2013-04-19 (×3): 500 mg via ORAL
  Filled 2013-04-17 (×4): qty 1

## 2013-04-17 MED ORDER — WARFARIN SODIUM 7.5 MG PO TABS
7.5000 mg | ORAL_TABLET | Freq: Once | ORAL | Status: AC
  Administered 2013-04-18: 7.5 mg via ORAL
  Filled 2013-04-17: qty 1

## 2013-04-17 MED ORDER — SUFENTANIL CITRATE 50 MCG/ML IV SOLN
INTRAVENOUS | Status: DC | PRN
  Administered 2013-04-17: 10 ug via INTRAVENOUS
  Administered 2013-04-17: 20 ug via INTRAVENOUS
  Administered 2013-04-17: 5 ug via INTRAVENOUS

## 2013-04-17 MED ORDER — MULTIVITAMINS PO CAPS: 1.0000 | ORAL_CAPSULE | Freq: Every day | ORAL | Status: DC

## 2013-04-17 MED ORDER — MIDAZOLAM HCL 5 MG/ML IJ SOLN
2.0000 mg | Freq: Once | INTRAMUSCULAR | Status: DC
  Filled 2013-04-17: qty 1

## 2013-04-17 MED ORDER — ESCITALOPRAM OXALATE 20 MG PO TABS
20.0000 mg | ORAL_TABLET | Freq: Every day | ORAL | Status: DC
  Administered 2013-04-18 – 2013-04-20 (×3): 20 mg via ORAL
  Filled 2013-04-17 (×3): qty 1

## 2013-04-17 MED ORDER — CEFAZOLIN SODIUM-DEXTROSE 2-3 GM-% IV SOLR
2.0000 g | Freq: Four times a day (QID) | INTRAVENOUS | Status: AC
  Administered 2013-04-18 (×2): 2 g via INTRAVENOUS
  Filled 2013-04-17 (×2): qty 50

## 2013-04-17 MED ORDER — PROPOFOL 10 MG/ML IV BOLUS
INTRAVENOUS | Status: DC | PRN
  Administered 2013-04-17: 200 mg via INTRAVENOUS

## 2013-04-17 MED ORDER — IRBESARTAN 300 MG PO TABS
300.0000 mg | ORAL_TABLET | Freq: Every day | ORAL | Status: DC
  Administered 2013-04-18 – 2013-04-20 (×4): 300 mg via ORAL
  Filled 2013-04-17 (×4): qty 1

## 2013-04-17 MED ORDER — WARFARIN VIDEO: Freq: Once | Status: DC

## 2013-04-17 MED ORDER — BISACODYL 10 MG RE SUPP: 10.0000 mg | Freq: Every day | RECTAL | Status: DC | PRN

## 2013-04-17 MED ORDER — METOCLOPRAMIDE HCL 10 MG PO TABS: 5.0000 mg | ORAL_TABLET | Freq: Three times a day (TID) | ORAL | Status: DC | PRN

## 2013-04-17 SURGICAL SUPPLY — 62 items
BANDAGE ELASTIC 4 VELCRO ST LF (GAUZE/BANDAGES/DRESSINGS) ×2 IMPLANT
BANDAGE ELASTIC 6 VELCRO ST LF (GAUZE/BANDAGES/DRESSINGS) ×2 IMPLANT
BANDAGE ESMARK 6X9 LF (GAUZE/BANDAGES/DRESSINGS) ×1 IMPLANT
BANDAGE GAUZE ELAST BULKY 4 IN (GAUZE/BANDAGES/DRESSINGS) ×2 IMPLANT
BLADE SAG 18X100X1.27 (BLADE) ×2 IMPLANT
BLADE SAW SGTL 13.0X1.19X90.0M (BLADE) ×2 IMPLANT
BLADE SURG 10 STRL SS (BLADE) ×2 IMPLANT
BNDG ELASTIC 6X10 VLCR STRL LF (GAUZE/BANDAGES/DRESSINGS) ×2 IMPLANT
BNDG ESMARK 6X9 LF (GAUZE/BANDAGES/DRESSINGS) ×2
BOWL SMART MIX CTS (DISPOSABLE) ×2 IMPLANT
CAPT RP KNEE ×2 IMPLANT
CEMENT HV SMART SET (Cement) ×4 IMPLANT
CLOTH BEACON ORANGE TIMEOUT ST (SAFETY) ×2 IMPLANT
CLSR STERI-STRIP ANTIMIC 1/2X4 (GAUZE/BANDAGES/DRESSINGS) ×2 IMPLANT
COVER SURGICAL LIGHT HANDLE (MISCELLANEOUS) ×2 IMPLANT
CUFF TOURNIQUET SINGLE 34IN LL (TOURNIQUET CUFF) IMPLANT
CUFF TOURNIQUET SINGLE 44IN (TOURNIQUET CUFF) ×2 IMPLANT
DRAPE EXTREMITY T 121X128X90 (DRAPE) ×2 IMPLANT
DRAPE PROXIMA HALF (DRAPES) ×2 IMPLANT
DRAPE U-SHAPE 47X51 STRL (DRAPES) ×2 IMPLANT
DRSG ADAPTIC 3X8 NADH LF (GAUZE/BANDAGES/DRESSINGS) ×2 IMPLANT
DRSG PAD ABDOMINAL 8X10 ST (GAUZE/BANDAGES/DRESSINGS) ×4 IMPLANT
DURAPREP 26ML APPLICATOR (WOUND CARE) ×2 IMPLANT
ELECT CAUTERY BLADE 6.4 (BLADE) ×2 IMPLANT
ELECT REM PT RETURN 9FT ADLT (ELECTROSURGICAL) ×2
ELECTRODE REM PT RTRN 9FT ADLT (ELECTROSURGICAL) ×1 IMPLANT
FACESHIELD LNG OPTICON STERILE (SAFETY) ×6 IMPLANT
GLOVE BIOGEL PI ORTHO PRO 7.5 (GLOVE) ×1
GLOVE BIOGEL PI ORTHO PRO SZ8 (GLOVE) ×1
GLOVE ORTHO TXT STRL SZ7.5 (GLOVE) ×2 IMPLANT
GLOVE PI ORTHO PRO STRL 7.5 (GLOVE) ×1 IMPLANT
GLOVE PI ORTHO PRO STRL SZ8 (GLOVE) ×1 IMPLANT
GLOVE SURG ORTHO 8.5 STRL (GLOVE) ×2 IMPLANT
GOWN STRL NON-REIN LRG LVL3 (GOWN DISPOSABLE) ×2 IMPLANT
GOWN STRL REIN XL XLG (GOWN DISPOSABLE) ×4 IMPLANT
HANDPIECE INTERPULSE COAX TIP (DISPOSABLE) ×1
IMMOBILIZER KNEE 22 UNIV (SOFTGOODS) ×2 IMPLANT
KIT BASIN OR (CUSTOM PROCEDURE TRAY) ×2 IMPLANT
KIT MANIFOLD (MISCELLANEOUS) ×2 IMPLANT
KIT ROOM TURNOVER OR (KITS) ×2 IMPLANT
MANIFOLD NEPTUNE II (INSTRUMENTS) ×2 IMPLANT
NS IRRIG 1000ML POUR BTL (IV SOLUTION) ×2 IMPLANT
PACK TOTAL JOINT (CUSTOM PROCEDURE TRAY) ×2 IMPLANT
PAD ARMBOARD 7.5X6 YLW CONV (MISCELLANEOUS) ×4 IMPLANT
PAD CAST 4YDX4 CTTN HI CHSV (CAST SUPPLIES) ×1 IMPLANT
PADDING CAST COTTON 4X4 STRL (CAST SUPPLIES) ×1
PADDING CAST COTTON 6X4 STRL (CAST SUPPLIES) ×2 IMPLANT
SET HNDPC FAN SPRY TIP SCT (DISPOSABLE) ×1 IMPLANT
SPONGE GAUZE 4X4 12PLY (GAUZE/BANDAGES/DRESSINGS) ×2 IMPLANT
STRIP CLOSURE SKIN 1/2X4 (GAUZE/BANDAGES/DRESSINGS) ×4 IMPLANT
SUCTION FRAZIER TIP 10 FR DISP (SUCTIONS) ×4 IMPLANT
SUT MNCRL AB 3-0 PS2 18 (SUTURE) ×2 IMPLANT
SUT VIC AB 0 CT1 27 (SUTURE) ×2
SUT VIC AB 0 CT1 27XBRD ANBCTR (SUTURE) ×2 IMPLANT
SUT VIC AB 1 CT1 27 (SUTURE) ×3
SUT VIC AB 1 CT1 27XBRD ANBCTR (SUTURE) ×3 IMPLANT
SUT VIC AB 2-0 CT1 27 (SUTURE) ×2
SUT VIC AB 2-0 CT1 TAPERPNT 27 (SUTURE) ×2 IMPLANT
TOWEL OR 17X24 6PK STRL BLUE (TOWEL DISPOSABLE) ×2 IMPLANT
TOWEL OR 17X26 10 PK STRL BLUE (TOWEL DISPOSABLE) ×2 IMPLANT
TRAY FOLEY CATH 16FRSI W/METER (SET/KITS/TRAYS/PACK) IMPLANT
WATER STERILE IRR 1000ML POUR (IV SOLUTION) ×4 IMPLANT

## 2013-04-17 NOTE — Brief Op Note (Signed)
04/17/2013  5:14 PM  PATIENT:  Pamela Sims  62 y.o. female  PRE-OPERATIVE DIAGNOSIS:  RIGHT KNEE ENDSTAGE OSTEOARTHRITIS  POST-OPERATIVE DIAGNOSIS:  RIGHT KNEE ENDSTAGE OSTEOARTHRITIS  PROCEDURE:  Procedure(s): RIGHT TOTAL KNEE ARTHROPLASTY (Right), DePuy Sigma RP  SURGEON:  Surgeon(s) and Role:    * Verlee Rossetti, MD - Primary  PHYSICIAN ASSISTANT:   ASSISTANTS: Thea Gist, PA-C   ANESTHESIA:   regional and general  EBL:  Total I/O In: 1000 [I.V.:1000] Out: -   BLOOD ADMINISTERED:none  DRAINS: none   LOCAL MEDICATIONS USED:    NONE  SPECIMEN:  No Specimen  DISPOSITION OF SPECIMEN:  N/A  COUNTS:  YES  TOURNIQUET:   Total Tourniquet Time Documented: Thigh (Right) - 91 minutes Total: Thigh (Right) - 91 minutes   DICTATION: .Other Dictation: Dictation Number [id number]  PLAN OF CARE: Admit to inpatient   PATIENT DISPOSITION:  PACU - hemodynamically stable.   Delay start of Pharmacological VTE agent (>24hrs) due to surgical blood loss or risk of bleeding: no

## 2013-04-17 NOTE — Progress Notes (Signed)
Orthopedic Tech Progress Note Patient Details:  Pamela Sims 17-Aug-1951 [id number]  CPM Right Knee CPM Right Knee: On Right Knee Flexion (Degrees): 60 Right Knee Extension (Degrees): 0   [name] 04/17/2013, 8:56 PM

## 2013-04-17 NOTE — Anesthesia Procedure Notes (Signed)
Anesthesia Regional Block:  Femoral nerve block  Pre-Anesthetic Checklist: ,, timeout performed, Correct Patient, Correct Site, Correct Laterality, Correct Procedure, Correct Position, site marked, Risks and benefits discussed,  Surgical consent,  Pre-op evaluation,  At surgeon's request and post-op pain management  Laterality: Right  Prep: chloraprep       Needles:   Needle Type: Other     Needle Length: 9cm  Needle Gauge: 21    Additional Needles:  Procedures: ultrasound guided (picture in chart) Femoral nerve block Narrative:  Start time: 04/17/2013 2:05 PM End time: 04/17/2013 2:12 PM Injection made incrementally with aspirations every 5 mL.  Performed by: Personally  Anesthesiologist: C. Zahira Brummond MD  Additional Notes: Ultrasound guidance used to: id relevant anatomy, confirm needle position, local anesthetic spread, avoidance of vascular puncture. Picture saved. No complications. Block performed personally by Janetta Hora. Gelene Mink, MD    Femoral nerve block

## 2013-04-17 NOTE — Anesthesia Preprocedure Evaluation (Signed)
Anesthesia Evaluation  Patient identified by MRN, date of birth, ID band Patient awake    Reviewed: Allergy & Precautions, H&P , NPO status , Patient's Chart, lab work & pertinent test results, reviewed documented beta blocker date and time   History of Anesthesia Complications (+) PROLONGED EMERGENCE  Airway Mallampati: II TM Distance: >3 FB Neck ROM: full    Dental   Pulmonary shortness of breath, asthma ,  breath sounds clear to auscultation        Cardiovascular hypertension, On Medications Rhythm:regular     Neuro/Psych  Headaches, PSYCHIATRIC DISORDERS  Neuromuscular disease    GI/Hepatic Neg liver ROS, hiatal hernia, GERD-  Medicated and Controlled,  Endo/Other  negative endocrine ROS  Renal/GU negative Renal ROS  negative genitourinary   Musculoskeletal   Abdominal   Peds  Hematology negative hematology ROS (+)   Anesthesia Other Findings See surgeon's H&P   Reproductive/Obstetrics negative OB ROS                           Anesthesia Physical Anesthesia Plan  ASA: II  Anesthesia Plan: General   Post-op Pain Management:    Induction: Intravenous  Airway Management Planned: Oral ETT  Additional Equipment:   Intra-op Plan:   Post-operative Plan: Extubation in OR  Informed Consent: I have reviewed the patients History and Physical, chart, labs and discussed the procedure including the risks, benefits and alternatives for the proposed anesthesia with the patient or authorized representative who has indicated his/her understanding and acceptance.   Dental Advisory Given  Plan Discussed with: CRNA and Surgeon  Anesthesia Plan Comments:         Anesthesia Quick Evaluation
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
## 2013-04-17 NOTE — Transfer of Care (Signed)
Immediate Anesthesia Transfer of Care Note  Patient: Pamela Sims  Procedure(s) Performed: Procedure(s): RIGHT TOTAL KNEE ARTHROPLASTY (Right)  Patient Location: PACU  Anesthesia Type:General  Level of Consciousness: awake, oriented and sedated  Airway & Oxygen Therapy: Patient Spontanous Breathing and Patient connected to face mask oxygen  Post-op Assessment: Report given to PACU RN, Post -op Vital signs reviewed and stable and Patient moving all extremities  Post vital signs: Reviewed and stable  Complications: No apparent anesthesia complications

## 2013-04-17 NOTE — Interval H&P Note (Signed)
History and Physical Interval Note:  04/17/2013 4:46 PM  Pamela Sims  has presented today for surgery, with the diagnosis of RIGHT KNEE ENDSTAGE OSTEOARTHRITIS  The various methods of treatment have been discussed with the patient and family. After consideration of risks, benefits and other options for treatment, the patient has consented to  Procedure(s): RIGHT TOTAL KNEE ARTHROPLASTY (Right) as a surgical intervention .  The patient's history has been reviewed, patient examined, no change in status, stable for surgery.  I have reviewed the patient's chart and labs.  Questions were answered to the patient's satisfaction.     Jontavious Commons,STEVEN R

## 2013-04-18 ENCOUNTER — Encounter (HOSPITAL_COMMUNITY): Payer: Self-pay | Admitting: Orthopedic Surgery

## 2013-04-18 LAB — PROTIME-INR
INR: 1.13 (ref 0.00–1.49)
Prothrombin Time: 14.3 seconds (ref 11.6–15.2)

## 2013-04-18 LAB — CBC
HCT: 33.2 % — ABNORMAL LOW (ref 36.0–46.0)
Hemoglobin: 11.6 g/dL — ABNORMAL LOW (ref 12.0–15.0)
MCH: 29.7 pg (ref 26.0–34.0)
MCHC: 34.9 g/dL (ref 30.0–36.0)
MCV: 85.1 fL (ref 78.0–100.0)
Platelets: 135 10*3/uL — ABNORMAL LOW (ref 150–400)
RBC: 3.9 MIL/uL (ref 3.87–5.11)
RDW: 13.9 % (ref 11.5–15.5)
WBC: 9.4 10*3/uL (ref 4.0–10.5)

## 2013-04-18 LAB — BASIC METABOLIC PANEL
BUN: 15 mg/dL (ref 6–23)
CO2: 29 mEq/L (ref 19–32)
Calcium: 8.8 mg/dL (ref 8.4–10.5)
Chloride: 98 mEq/L (ref 96–112)
Creatinine, Ser: 0.86 mg/dL (ref 0.50–1.10)
GFR calc Af Amer: 83 mL/min — ABNORMAL LOW (ref >90)
GFR calc non Af Amer: 72 mL/min — ABNORMAL LOW (ref >90)
Glucose, Bld: 118 mg/dL — ABNORMAL HIGH (ref 70–99)
Potassium: 4 mEq/L (ref 3.5–5.1)
Sodium: 134 mEq/L — ABNORMAL LOW (ref 135–145)

## 2013-04-18 MED ORDER — WARFARIN SODIUM 7.5 MG PO TABS
7.5000 mg | ORAL_TABLET | Freq: Once | ORAL | Status: AC
  Administered 2013-04-18: 7.5 mg via ORAL
  Filled 2013-04-18: qty 1

## 2013-04-18 NOTE — Anesthesia Postprocedure Evaluation (Signed)
Anesthesia Post Note  Patient: Pamela Sims  Procedure(s) Performed: Procedure(s) (LRB): RIGHT TOTAL KNEE ARTHROPLASTY (Right)  Anesthesia type: General  Patient location: PACU  Post pain: Pain level controlled  Post assessment: Patient's Cardiovascular Status Stable  Last Vitals:  Filed Vitals:   04/18/13 0400  BP:   Pulse:   Temp:   Resp: 17    Post vital signs: Reviewed and stable  Level of consciousness: alert  Complications: No apparent anesthesia complications

## 2013-04-18 NOTE — Progress Notes (Signed)
Orthopedic Tech Progress Note Patient Details:  Pamela Sims 1951-08-09 [id number] On cpm at 3:20 pm RLE 0-60. Patient ID: [name], female   DOB: [birth year], 62 y.o.   MRN: [id number]   [name] 04/18/2013, 3:18 PM

## 2013-04-18 NOTE — Progress Notes (Signed)
UR COMPLETED  

## 2013-04-18 NOTE — Evaluation (Signed)
Physical Therapy Evaluation Patient Details Name: Pamela Sims MRN: [id number] DOB: 11-Feb-1951 Today's Date: 04/18/2013 Time: 1478-2956 PT Time Calculation (min): 30 min  PT Assessment / Plan / Recommendation History of Present Illness  Pt. underwent R TKA for endstage OA  Clinical Impression  Pt. Made exceptional progress with mobility first time up with PT and she proudly reports "Dr. Ranell Patrick says I  Am in the 98th percentile" regarding how well she does with knee replacement.  She needs PT  to address mobility and ROM/strength issues.  She did drop her O2 sats on room air during her short walk. See vitals tab in chart for specific findings but generally was at 94% on 3L O2 and droppped to 81% on room air.  She rebounded nicely with pursed lip breathing and reapplicaation of O2.  Should progress well with PT.      PT Assessment  Patient needs continued PT services    Follow Up Recommendations  Home health PT;Supervision/Assistance - 24 hour;Supervision for mobility/OOB    Does the patient have the potential to tolerate intense rehabilitation      Barriers to Discharge        Equipment Recommendations  None recommended by PT    Recommendations for Other Services     Frequency 7X/week    Precautions / Restrictions Precautions Precautions: Knee Precaution Booklet Issued: Yes (comment) Precaution Comments: pt. provided TKA exercises and reviewed ankle pumps and quad sets; also no pillow under knee Required Braces or Orthoses: Knee Immobilizer - Right Knee Immobilizer - Right: Other (comment) (until DC'd) Restrictions Weight Bearing Restrictions: Yes RLE Weight Bearing: Weight bearing as tolerated   Pertinent Vitals/Pain See vitals tab       Mobility  Bed Mobility Bed Mobility: Supine to Sit;Sitting - Scoot to Edge of Bed;Sit to Supine Supine to Sit: 5: Supervision;HOB flat Sitting - Scoot to Edge of Bed: 6: Modified independent (Device/Increase time) Sit to Supine:  Not Tested (comment) Details for Bed Mobility Assistance: pt. managing without pyhiscal assist, safe technique Transfers Transfers: Sit to Stand;Stand to Sit Sit to Stand: 4: Min assist;From bed;With upper extremity assist Stand to Sit: 4: Min assist;To chair/3-in-1;With armrests Details for Transfer Assistance: cues for technique and hand placement Ambulation/Gait Ambulation/Gait Assistance: 4: Min assist Ambulation Distance (Feet): 30 Feet Assistive device: Rolling walker Ambulation/Gait Assistance Details: Pt. needed one reminder of correct gait sequence and was able to duplicate.  Min. assist needed for stabilize.   Gait Pattern: Step-through pattern;Decreased stride length;Decreased hip/knee flexion - right    Exercises Total Joint Exercises Ankle Circles/Pumps: AROM;Both;15 reps Quad Sets: AROM;Both;15 reps   PT Diagnosis: Difficulty walking;Abnormality of gait;Acute pain  PT Problem List: Decreased strength;Decreased range of motion;Decreased activity tolerance;Decreased mobility;Decreased knowledge of use of DME;Pain PT Treatment Interventions: DME instruction;Gait training;Stair training;Functional mobility training;Therapeutic activities;Therapeutic exercise;Patient/family education     PT Goals(Current goals can be found in the care plan section) Acute Rehab PT Goals Patient Stated Goal: return to independence in all activities PT Goal Formulation: With patient Time For Goal Achievement: 04/25/13 Potential to Achieve Goals: Good  Visit Information  Last PT Received On: 04/18/13 Assistance Needed: +1 History of Present Illness: Pt. underwent R TKA for endstage OA       Prior Functioning  Home Living Family/patient expects to be discharged to:: Private residence Living Arrangements: Other (Comment) (grandson lives with pt, age 74) Available Help at Discharge: Family;Available 24 hours/day Type of Home: House Home Access: Stairs to enter [employer] of  Steps: 1 Entrance Stairs-Rails: None Home Layout: Two level;Able to live on main level with bedroom/bathroom Home Equipment: Dan Humphreys - 2 wheels;Tub bench;Bedside commode Prior Function Level of Independence: Independent Communication Communication: No difficulties    Cognition  Cognition Arousal/Alertness: Awake/alert Behavior During Therapy: WFL for tasks assessed/performed Overall Cognitive Status: Within Functional Limits for tasks assessed    Extremity/Trunk Assessment Upper Extremity Assessment Upper Extremity Assessment: Overall WFL for tasks assessed Lower Extremity Assessment Lower Extremity Assessment: Overall WFL for tasks assessed;RLE deficits/detail (L LE WFL) RLE Deficits / Details: strong ankle pump , good quad set Cervical / Trunk Assessment Cervical / Trunk Assessment: Normal   Balance Balance Balance Assessed: Yes Static Sitting Balance Static Sitting - Balance Support: No upper extremity supported;Feet unsupported Static Sitting - Level of Assistance: 7: Independent Dynamic Standing Balance Dynamic Standing - Balance Support: Left upper extremity supported;During functional activity Dynamic Standing - Level of Assistance: 5: Stand by assistance  End of Session PT - End of Session Equipment Utilized During Treatment: Gait belt;Right knee immobilizer Activity Tolerance: Patient tolerated treatment well Patient left: in chair;with call bell/phone within reach Nurse Communication: Mobility status;Weight bearing status;Other (comment) (drop in sats on RA with activity) CPM Right Knee CPM Right Knee: Off Right Knee Flexion (Degrees): 50 Right Knee Extension (Degrees): 0 Additional Comments: completed 2 hours  GP     Ferman Hamming 04/18/2013, 12:32 PM Weldon Picking PT Acute Rehab Services [phone] Beeper [phone]

## 2013-04-18 NOTE — Progress Notes (Signed)
PT Cancellation Note  Patient Details Name: Pamela Sims MRN: [id number] DOB: April 23, 1951   Cancelled Treatment:    Reason Eval/Treat Not Completed: Other (comment) (pt. just back in CPM and wanting to remain there )   Ferman Hamming 04/18/2013, 3:27 PM Weldon Picking PT Acute Rehab Services [phone] Beeper [phone]

## 2013-04-18 NOTE — Progress Notes (Signed)
04/18/13 Spoke with patient about d/c plan, workers comp Sports coach is [employer]. Called [employer] and spoke with Development worker, community. He gave me Shambria's number,[phone]. Contacted [employer] and requested HHPT, HHRN for PT/INR checks,and CPM machine. She requested orders, op note, H and P and PT note be faxed to [phone]. Faxed requested info and received confimation. [employer] to provide CPM. [employer] [employer] will set up HHPT and HHRN. Updated patient. Will continue to follow for d/c needs. Jacquelynn Cree RN, BSN, CCM

## 2013-04-18 NOTE — Progress Notes (Signed)
Orthopedic Tech Progress Note Patient Details:  Pamela Sims Dec 10, 1950 [id number] On cpm at 8:25 pm RLE 0-64 Patient ID: Pamela Sims, female   DOB: [birth year], 62 y.o.   MRN: [id number]   Pamela Sims 04/18/2013, 8:25 PM

## 2013-04-18 NOTE — Progress Notes (Signed)
ANTICOAGULATION CONSULT NOTE   Pharmacy Consult for warfarin Indication: VTE prophylaxis  Patient Measurements: Height: 5\' 7"  (170.2 cm) IBW/kg (Calculated) : 61.6  Vital Signs: Temp: 98.7 F (37.1 C) (07/24 0157) Temp src: Oral (07/24 0157) BP: 136/84 mmHg (07/24 0157) Pulse Rate: 80 (07/24 0157)  Labs:  Recent Labs  04/17/13 1326 04/17/13 2020 04/18/13 0500  HGB  --  12.5 11.6*  HCT  --  35.2* 33.2*  PLT  --  150 135*  APTT 28  --   --   LABPROT 13.5  --  14.3  INR 1.05  --  1.13  CREATININE  --  0.91 0.86    The CrCl is unknown because both a height and weight (above a minimum accepted value) are required for this calculation.  Assessment: 61 yof s/p R TKA to start warfarin for VTE prophylaxis. She was not on any anticoagulation PTA. She has also been ordered lovenox prophylaxis. Celebrex has been ordered post-op as well. NSAIDs with concurrent warfarin use can increase the risk of bleeding.   INR this morning is up slightly to 1.1. No bleeding issues.   Goal of Therapy:  INR 2-3   Plan:  1. Warfarin 7.5mg  PO x 1 tonight 2. Daily INR 3. Warfarin book + video to patient 4. Monitor for S&S of bleeding   Sheppard Coil PharmD., BCPS Clinical Pharmacist Pager [phone] 04/18/2013 1:34 PM

## 2013-04-18 NOTE — Plan of Care (Signed)
Problem: Consults Goal: Diagnosis- Total Joint Replacement Primary Total Knee Right     

## 2013-04-18 NOTE — Progress Notes (Signed)
   Subjective: 1 Day Post-Op Procedure(s) (LRB): RIGHT TOTAL KNEE ARTHROPLASTY (Right)  Mild to moderate pain in right knee but overall tolerable this morning Denies any new symptoms or issues Patient reports pain as mild.  Objective:   VITALS:   Filed Vitals:   04/18/13 0400  BP:   Pulse:   Temp:   Resp: 17    Right knee dressing intact  Currently in CPM nv intact distally   LABS  Recent Labs  04/17/13 2020 04/18/13 0500  HGB 12.5 11.6*  HCT 35.2* 33.2*  WBC 14.3* 9.4  PLT 150 135*     Recent Labs  04/17/13 2020 04/18/13 0500  NA  --  134*  K  --  4.0  BUN  --  15  CREATININE 0.91 0.86  GLUCOSE  --  118*     Assessment/Plan: 1 Day Post-Op Procedure(s) (LRB): RIGHT TOTAL KNEE ARTHROPLASTY (Right)   PT/OT Pain control Pulmonary toilet D/c planning  Alphonsa Overall, MPAS, PA-C  04/18/2013, 8:24 AM

## 2013-04-18 NOTE — Op Note (Signed)
NAMECLESSIE, KARRAS               ACCOUNT NO.:  [id number]  MEDICAL RECORD NO.:  [id number]  LOCATION:  5N14C                        FACILITY:  [facility]  PHYSICIAN:  Almedia Balls. Ranell Patrick, M.D. DATE OF BIRTH:  [birth year]  DATE OF PROCEDURE:  04/17/2013 DATE OF DISCHARGE:                              OPERATIVE REPORT   PREOPERATIVE DIAGNOSIS:  Right knee end-stage osteoarthritis.  POSTOPERATIVE DIAGNOSIS:  Right knee end-stage osteoarthritis.  PROCEDURE PERFORMED:  Right total knee replacement using [employer] rotating platform prosthesis.  ATTENDING SURGEON:  Almedia Balls. Ranell Patrick, M.D.  ASSISTANT:  Donnie Coffin. Dixon, PA-C, who was scrubbed the entire procedure and necessary for satisfactory completion of surgery.  ANESTHESIA:  General anesthesia was used plus a femoral block.  ESTIMATED BLOOD LOSS:  Minimal.  FLUID REPLACEMENT:  1500 mL of crystalloid.  INSTRUMENT COUNTS:  Correct.  COMPLICATIONS:  There were no complications.  ANTIBIOTICS:  Perioperative antibiotics were given.  INDICATIONS:  The patient is a 62 year old female with end-stage knee arthritis secondary to posttraumatic arthritis from a work injury.  The patient has had progressive pain despite conservative management.  She has pain with activities of daily living.  Pain with walking more than 100 feet.  She does have to stop and rest.  She does use a occasionally a supportive device cane.  She has evidence of bone-on-bone arthritis on her x-ray.  Based on failure of conservative management, she presents for a total knee arthroplasty.  She has had a prior total knee in the left and done well with that.  Informed consent obtained.  DESCRIPTION OF PROCEDURE:  After an adequate level of anesthesia achieved, the patient was positioned supine in the operating room table. Right leg correctly identified.  Nonsterile tourniquet placed on the right proximal thigh.  Right leg sterilely prepped and draped in the usual manner.   The left leg was secured and padded appropriately.  After a sterile prep and drape, we did a time-out.  We elevated the limb to above thigh when it wrapped with a Esmarch bandage to exsanguinate the limb and then we elevated the tourniquet to 350 mmHg.  We then went ahead and placed the knee in flexion and performed a midline skin incision with a 10 blade scalpel.  Dissection down through subcutaneous tissues using the 10 blade.  We used a fresh 10 blade for medial parapatellar arthrotomy, everted the patella, divided the lateral patellofemoral ligaments, entered the distal femur with the step-cut drill, placed in a medullary resection guide set on 4 degrees on the right with 10 mm resection.  We went ahead and made our distal cut and then went ahead and sized anterior down to a size 3.  We then did our anterior posterior and chamfer cuts with a formalin block. Next, we removed ACL, PCL, meniscal tissue, subluxed the tibia anteriorly and performed our 90 degree perpendicular cut to the long axis of the tibia with minimal posterior slope to this posterior cruciate substituting prosthesis.  We then went ahead and removed excess posterior bone off the posterior femur with a lamina spreader.  We went ahead and checked our gaps symmetric at 10 mm both in flexion and extension.  We then removed our pins on her tibia.  We finished our tibial preparation with the modular drill and keel punch for a size 4 and then on the femur, we did our box cut for the posterior cruciate substituting femoral prosthesis.  We impacted that trial size 3 right and then the trial 10 poly insert.  We extended the knee.  We were able to get full extension, nice stability.  We felt like I can probably get a 12.5 for a real.  We then went ahead and sized our patella with a 26 mm thickness, went ahead and set it on 18 and then did our resection which gave [place] a 17 mm thickness remaining.  We measured to a size 35 patellar  button, drilled our lug holes for that and then I placed the button in place, ranged the knee fully no maltracking with no-touch technique.  We thoroughly irrigated the knee and then removed all components, pulse irrigated the bone, and then cemented the components into place with the high viscosity cement.  This was a [facility] prosthesis. Once the components were in place, we placed a 10 trial in place, placed the knee in extension, allowed the cement to harden.  We had a patellar clamp on the patella.  Once the cement had hardened, we removed excess cement with quarter-inch curved osteotome, inspecting all aspects of the knee including posteriorly on the femurs and then also in the back on the tibial tray, and once we were satisfied there was no extraneous cement. We went and placed our real 12.5 poly.  We were able to get the knee relocated and extended, and we are able to get with slight pressure on the knee pushing down.  We could get to full extension and nice and stable in flexion.  We then went and thoroughly pulse irrigated the knee, then repaired the median parapatellar arthrotomy with #1 Vicryl suture, simple sutures followed by 2-0 Vicryl subcutaneous closure, and running 4-0 Monocryl for skin.  Steri-Strips applied, followed by sterile dressing.  The patient tolerated the surgery well.     Almedia Balls. Ranell Patrick, M.D.     SRN/MEDQ  D:  04/17/2013  T:  04/18/2013  Job:  [id number]

## 2013-04-19 LAB — CBC
HCT: 32.2 % — ABNORMAL LOW (ref 36.0–46.0)
Hemoglobin: 11.5 g/dL — ABNORMAL LOW (ref 12.0–15.0)
MCH: 29.5 pg (ref 26.0–34.0)
MCHC: 35.7 g/dL (ref 30.0–36.0)
MCV: 82.6 fL (ref 78.0–100.0)
Platelets: 133 10*3/uL — ABNORMAL LOW (ref 150–400)
RBC: 3.9 MIL/uL (ref 3.87–5.11)
RDW: 13.6 % (ref 11.5–15.5)
WBC: 9.8 10*3/uL (ref 4.0–10.5)

## 2013-04-19 LAB — PROTIME-INR
INR: 1.41 (ref 0.00–1.49)
Prothrombin Time: 16.9 seconds — ABNORMAL HIGH (ref 11.6–15.2)

## 2013-04-19 MED ORDER — METHOCARBAMOL 500 MG PO TABS: 500.0000 mg | ORAL_TABLET | Freq: Four times a day (QID) | ORAL | Status: DC | PRN

## 2013-04-19 MED ORDER — OXYCODONE HCL 5 MG PO TABS: 5.0000 mg | ORAL_TABLET | ORAL | Status: DC | PRN

## 2013-04-19 MED ORDER — WARFARIN SODIUM 7.5 MG PO TABS
7.5000 mg | ORAL_TABLET | Freq: Every day | ORAL | Status: DC
  Administered 2013-04-19: 7.5 mg via ORAL
  Filled 2013-04-19 (×2): qty 1

## 2013-04-19 MED ORDER — WARFARIN SODIUM 7.5 MG PO TABS: 7.5000 mg | ORAL_TABLET | Freq: Every day | ORAL | Status: DC

## 2013-04-19 NOTE — Progress Notes (Signed)
OT Cancellation Note  Patient Details Name: Pamela Sims MRN: [id number] DOB: [birth year]   Cancelled Treatment:    Reason Eval/Treat Not Completed: OT screened, no needs identified, will sign off. Pt has all necessary DME and A/E at home from previous knee surgery and no acute OT needs indicated at this time  Galen Manila 04/19/2013, 3:05 PM

## 2013-04-19 NOTE — Progress Notes (Signed)
ANTICOAGULATION CONSULT NOTE   Pharmacy Consult for warfarin Indication: VTE prophylaxis  Patient Measurements: Height: 5\' 7"  (170.2 cm) IBW/kg (Calculated) : 61.6  Vital Signs: Temp: 98.6 F (37 C) (07/25 0648) Temp src: Oral (07/25 0648) BP: 132/67 mmHg (07/25 0648) Pulse Rate: 83 (07/25 0648)  Labs:  Recent Labs  04/17/13 1326  04/17/13 2020 04/18/13 0500 04/19/13 0540  HGB  --   < > 12.5 11.6* 11.5*  HCT  --   --  35.2* 33.2* 32.2*  PLT  --   --  150 135* 133*  APTT 28  --   --   --   --   LABPROT 13.5  --   --  14.3 16.9*  INR 1.05  --   --  1.13 1.41  CREATININE  --   --  0.91 0.86  --   < > = values in this interval not displayed.  The CrCl is unknown because both a height and weight (above a minimum accepted value) are required for this calculation.  Assessment: 61 yof s/p R TKA to start warfarin for VTE prophylaxis. She was not on any anticoagulation PTA. She has also been ordered lovenox prophylaxis. Celebrex has been ordered post-op as well. NSAIDs with concurrent warfarin use can increase the risk of bleeding.   INR this morning is up to 1.41. No bleeding issues.   Goal of Therapy:  INR 2-3   Plan:  1. Warfarin 7.5mg  PO daily at 1800 pm 2. Daily INR 3. Warfarin book + video to patient 4. Monitor for S&S of bleeding   Thank you. Okey Regal, PharmD [phone]  04/19/2013 2:22 PM

## 2013-04-19 NOTE — Progress Notes (Signed)
   Subjective: 2 Days Post-Op Procedure(s) (LRB): RIGHT TOTAL KNEE ARTHROPLASTY (Right)  Pt doing well Already walking the halls Patient reports pain as mild.  Objective:   VITALS:   Filed Vitals:   04/19/13 0648  BP: 132/67  Pulse: 83  Temp: 98.6 F (37 C)  Resp: 16    Right knee incision healing well Minimal bleeding s/p surgery nv intact distally Good early rom  LABS  Recent Labs  04/17/13 2020 04/18/13 0500 04/19/13 0540  HGB 12.5 11.6* 11.5*  HCT 35.2* 33.2* 32.2*  WBC 14.3* 9.4 9.8  PLT 150 135* 133*     Recent Labs  04/17/13 2020 04/18/13 0500  NA  --  134*  K  --  4.0  BUN  --  15  CREATININE 0.91 0.86  GLUCOSE  --  118*     Assessment/Plan: 2 Days Post-Op Procedure(s) (LRB): RIGHT TOTAL KNEE ARTHROPLASTY (Right)  PT/OT Monitor O2 sats Plan for d/c tomorrow am F/u in 2 weeks   Alphonsa Overall, MPAS, PA-C  04/19/2013, 11:01 AM

## 2013-04-19 NOTE — Progress Notes (Addendum)
Physical Therapy Treatment Patient Details Name: Pamela Sims MRN: [id number] DOB: August 19, 1951 Today's Date: 04/19/2013 Time: 7829-5621 PT Time Calculation (min): 16 min  PT Assessment / Plan / Recommendation  History of Present Illness Pt. underwent R TKA for endstage OA (Simultaneous filing. User may not have seen previous data.)   Clinical Impression Pt. porgressing steadily.  Sats in low 90s throughout session on room air, though she needed one standing rest while ambulating because she felt slightly short of breath.  No dyspnea noted.       Follow Up Recommendations  Home health PT;Supervision/Assistance - 24 hour;Supervision for mobility/OOB     Does the patient have the potential to tolerate intense rehabilitation     Barriers to Discharge        Equipment Recommendations  None recommended by PT    Recommendations for Other Services    Frequency 7X/week   Progress towards PT Goals Progress towards PT goals: Progressing toward goals  Plan Current plan remains appropriate    Precautions / Restrictions Precautions Precautions: Knee Precaution Comments: educated pt on SAQ, SLR, knee flexion Required Braces or Orthoses: Knee Immobilizer - Right Knee Immobilizer - Right: Other (comment) (until Dc'd) Restrictions Weight Bearing Restrictions: Yes RLE Weight Bearing: Weight bearing as tolerated   Pertinent Vitals/Pain See vitals tab     Mobility  Bed Mobility Bed Mobility: Supine to Sit;Sitting - Scoot to Edge of Bed Supine to Sit: 6: Modified independent (Device/Increase time);HOB flat Sitting - Scoot to Edge of Bed: 6: Modified independent (Device/Increase time) Sit to Supine: 6: Modified independent (Device/Increase time) Details for Bed Mobility Assistance: managing won leg in transitions Transfers Transfers: Sit to Stand;Stand to Sit Sit to Stand: 5: Supervision;From bed;From toilet;With upper extremity assist Stand to Sit: 5: Supervision;With upper extremity  assist;To bed;To toilet Details for Transfer Assistance: supervision for safety Ambulation/Gait Ambulation/Gait Assistance: 5: Supervision Ambulation Distance (Feet): 150 Feet Assistive device: Rolling walker Ambulation/Gait Assistance Details: good sequence and technique; no overt LOB noted Gait Pattern: Step-through pattern        PT Diagnosis:    PT Problem List:   PT Treatment Interventions:     PT Goals (current goals can now be found in the care plan section) Acute Rehab PT Goals Patient Stated Goal: return to independence in all activities  Visit Information  Last PT Received On: 04/19/13 Assistance Needed: +1 History of Present Illness: Pt. underwent R TKA for endstage OA (Simultaneous filing. User may not have seen previous data.)    Subjective Data  Subjective: Pt. 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 Patient Stated Goal: return to independence in all activities   Cognition  Cognition Arousal/Alertness: Awake/alert Behavior During Therapy: WFL for tasks assessed/performed Overall Cognitive Status: Within Functional Limits for tasks assessed    Balance     End of Session PT - End of Session Equipment Utilized During Treatment: Gait belt;Right knee immobilizer Activity Tolerance: Patient tolerated treatment well Patient left: in bed;with call bell/phone within reach Nurse Communication: Mobility status;Weight bearing status   GP     Ferman Hamming 04/19/2013, 3:08 PM Weldon Picking PT Acute Rehab Services [phone] Beeper [phone]

## 2013-04-19 NOTE — Progress Notes (Signed)
Physical Therapy Treatment Patient Details Name: Pamela Sims MRN: [id number] DOB: 20-Feb-1951 Today's Date: 04/19/2013 Time: 1191-4782 PT Time Calculation (min): 42 min  PT Assessment / Plan / Recommendation  History of Present Illness Pt. underwent R TKA for endstage OA   Clinical Impression Pt. Making good progress with her mobility and with ROM in R knee.  Strength gradually progressing.  She did not have issues with her sats dropping with ambulation today during PT/ambulation.      Follow Up Recommendations  Home health PT;Supervision/Assistance - 24 hour;Supervision for mobility/OOB     Does the patient have the potential to tolerate intense rehabilitation     Barriers to Discharge        Equipment Recommendations  None recommended by PT    Recommendations for Other Services    Frequency 7X/week   Progress towards PT Goals Progress towards PT goals: Progressing toward goals  Plan Current plan remains appropriate    Precautions / Restrictions Precautions Precautions: Knee Precaution Comments: educated pt on SAQ, SLR, knee flexion Required Braces or Orthoses: Knee Immobilizer - Right Knee Immobilizer - Right: Other (comment) (until DC'd) Restrictions Weight Bearing Restrictions: Yes RLE Weight Bearing: Weight bearing as tolerated   Pertinent Vitals/Pain See vitals tab      Mobility  Bed Mobility Bed Mobility: Supine to Sit;Sitting - Scoot to Edge of Bed Supine to Sit: 5: Supervision;HOB flat Sitting - Scoot to Edge of Bed: 6: Modified independent (Device/Increase time) Sit to Supine: Not Tested (comment) Details for Bed Mobility Assistance: pt. managing without phyiscal assist, safe technique Transfers Transfers: Sit to Stand;Stand to Sit Sit to Stand: 4: Min guard;With upper extremity assist;From bed;From chair/3-in-1 Stand to Sit: 4: Min guard;With upper extremity assist;With armrests;To toilet;To chair/3-in-1 Details for Transfer Assistance: pt.  remembered correct hand placement today; min guard for safety and stability Ambulation/Gait Ambulation/Gait Assistance: 4: Min guard Ambulation Distance (Feet): 230 Feet (115 x 2) Assistive device: Rolling walker Ambulation/Gait Assistance Details: good sequencing and technique; min guard assist for safety; needed one standing rest break each direction and seated rest break at halfway point Gait Pattern: Step-through pattern Stairs: Yes Stairs Assistance: 4: Min guard Stair Management Technique: No rails;Backwards;With walker Number of Stairs: 1    Exercises Total Joint Exercises Ankle Circles/Pumps: AROM;Both;15 reps Quad Sets: AROM;Both;15 reps Short Arc QuadBarbaraann Boys;Right;10 reps Heel Slides: AAROM;Right;10 reps Straight Leg Raises: AAROM;Right;5 reps Knee Flexion: AROM;Right;5 reps Goniometric ROM: 0-80   PT Diagnosis:    PT Problem List:   PT Treatment Interventions:     PT Goals (current goals can now be found in the care plan section) Acute Rehab PT Goals Patient Stated Goal: return to independence in all activities  Visit Information  Last PT Received On: 04/19/13 Assistance Needed: +1 History of Present Illness: Pt. underwent R TKA for endstage OA    Subjective Data  Subjective: Pt. 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 Patient Stated Goal: return to independence in all activities   Cognition  Cognition Arousal/Alertness: Awake/alert Behavior During Therapy: WFL for tasks assessed/performed Overall Cognitive Status: Within Functional Limits for tasks assessed    Balance     End of Session PT - End of Session Equipment Utilized During Treatment: Gait belt;Right knee immobilizer Activity Tolerance: Patient tolerated treatment well Patient left: in chair;with call bell/phone within reach Nurse Communication: Mobility status;Weight bearing status   GP     Ferman Hamming 04/19/2013, 12:34 PM Weldon Picking  PT Acute Rehab Services [phone] [place] [phone]

## 2013-04-19 NOTE — Discharge Summary (Signed)
Physician Discharge Summary   Patient ID: Pamela Sims MRN: [id number] DOB/AGE: [birth year] 61 y.o.  Admit date: 04/17/2013 Discharge date: 04/20/13  Admission Diagnoses:  Right knee endstage osteoarthritis  Discharge Diagnoses:  Same   Surgeries: Procedure(s): RIGHT TOTAL KNEE ARTHROPLASTY on 04/17/2013   Consultants: PT/OT  Discharged Condition: Stable  Hospital Course: Pamela Sims is an 62 y.o. female who was admitted 04/17/2013 with a chief complaint of No chief complaint on file. , and found to have a diagnosis of <principal problem not specified>.  They were brought to the operating room on 04/17/2013 and underwent the above named procedures.    The patient had an uncomplicated hospital course and was stable for discharge.  Recent vital signs:  Filed Vitals:   04/19/13 0648  BP: 132/67  Pulse: 83  Temp: 98.6 F (37 C)  Resp: 16    Recent laboratory studies:  Results for orders placed during the hospital encounter of 04/17/13  APTT      Result Value Range   aPTT 28  24 - 37 seconds  PROTIME-INR      Result Value Range   Prothrombin Time 13.5  11.6 - 15.2 seconds   INR 1.05  0.00 - 1.49  CBC      Result Value Range   WBC 14.3 (*) 4.0 - 10.5 K/uL   RBC 4.16  3.87 - 5.11 MIL/uL   Hemoglobin 12.5  12.0 - 15.0 g/dL   HCT 84.6 (*) 96.2 - 95.2 %   MCV 84.6  78.0 - 100.0 fL   MCH 30.0  26.0 - 34.0 pg   MCHC 35.5  30.0 - 36.0 g/dL   RDW 84.1  32.4 - 40.1 %   Platelets 150  150 - 400 K/uL  CREATININE, SERUM      Result Value Range   Creatinine, Ser 0.91  0.50 - 1.10 mg/dL   GFR calc non Af Amer 67 (*) >90 mL/min   GFR calc Af Amer 78 (*) >90 mL/min  PROTIME-INR      Result Value Range   Prothrombin Time 14.3  11.6 - 15.2 seconds   INR 1.13  0.00 - 1.49  CBC      Result Value Range   WBC 9.4  4.0 - 10.5 K/uL   RBC 3.90  3.87 - 5.11 MIL/uL   Hemoglobin 11.6 (*) 12.0 - 15.0 g/dL   HCT 02.7 (*) 25.3 - 66.4 %   MCV 85.1  78.0 - 100.0 fL   MCH 29.7   26.0 - 34.0 pg   MCHC 34.9  30.0 - 36.0 g/dL   RDW 40.3  47.4 - 25.9 %   Platelets 135 (*) 150 - 400 K/uL  BASIC METABOLIC PANEL      Result Value Range   Sodium 134 (*) 135 - 145 mEq/L   Potassium 4.0  3.5 - 5.1 mEq/L   Chloride 98  96 - 112 mEq/L   CO2 29  19 - 32 mEq/L   Glucose, Bld 118 (*) 70 - 99 mg/dL   BUN 15  6 - 23 mg/dL   Creatinine, Ser 5.63  0.50 - 1.10 mg/dL   Calcium 8.8  8.4 - 87.5 mg/dL   GFR calc non Af Amer 72 (*) >90 mL/min   GFR calc Af Amer 83 (*) >90 mL/min  PROTIME-INR      Result Value Range   Prothrombin Time 16.9 (*) 11.6 - 15.2 seconds   INR 1.41  0.00 - 1.49  CBC      Result Value Range   WBC 9.8  4.0 - 10.5 K/uL   RBC 3.90  3.87 - 5.11 MIL/uL   Hemoglobin 11.5 (*) 12.0 - 15.0 g/dL   HCT 09.8 (*) 11.9 - 14.7 %   MCV 82.6  78.0 - 100.0 fL   MCH 29.5  26.0 - 34.0 pg   MCHC 35.7  30.0 - 36.0 g/dL   RDW 82.9  56.2 - 13.0 %   Platelets 133 (*) 150 - 400 K/uL    Discharge Medications:     Medication List    ASK your doctor about these medications       escitalopram 20 MG tablet  Commonly known as:  LEXAPRO  Take 20 mg by mouth daily.     KRILL OIL PO  Take by mouth daily.     methylphenidate 54 MG CR tablet  Commonly known as:  CONCERTA  Take 54 mg by mouth every morning.     methylphenidate 10 MG tablet  Commonly known as:  RITALIN  Take 10 mg by mouth daily as needed. Patient on takes on school days.     multivitamin capsule  Take 1 capsule by mouth daily.     valsartan-hydrochlorothiazide 320-25 MG per tablet  Commonly known as:  DIOVAN HCT  Take 1 tablet by mouth daily.        Diagnostic Studies: Dg Chest 2 View  04/08/2013   *RADIOLOGY REPORT*  Clinical Data: Right total knee arthroplasty.  CHEST - 2 VIEW  Comparison: Two-view chest 07/29/2009.  Findings: The heart size is normal.  Of sinus changes are evident. No focal airspace disease is present.  Mild degenerative changes are noted within the thoracic spine.  Surgical  clips are present at the gallbladder fossa.  IMPRESSION:  1.  Emphysema. 2.  No acute cardiopulmonary disease or significant interval change.   Original Report Authenticated By: Marin Roberts, M.D.   Dg Knee Right Port  04/17/2013   *RADIOLOGY REPORT*  Clinical Data: Postop right knee arthroplasty.  PORTABLE RIGHT KNEE - 1-2 VIEW  Comparison: None.  Findings: The patient has undergone interval right total knee arthroplasty.  The hardware appears well positioned. There is evidence of a knee joint effusion.  A small amount of air is present within the joint and soft tissues surrounding the knee. There is no evidence of acute fracture or subluxation.  IMPRESSION: No demonstrated complication related to right total knee arthroplasty. Moderate to large knee joint effusion.   Original Report Authenticated By: Carey Bullocks, M.D.    Disposition: 01-Home or Self Care       Signed: Josiel Gahm B 04/19/2013, 11:02 AM

## 2013-04-20 LAB — PROTIME-INR
INR: 1.52 — ABNORMAL HIGH (ref 0.00–1.49)
Prothrombin Time: 17.9 seconds — ABNORMAL HIGH (ref 11.6–15.2)

## 2013-04-20 LAB — CBC
HCT: 30.4 % — ABNORMAL LOW (ref 36.0–46.0)
Hemoglobin: 10.5 g/dL — ABNORMAL LOW (ref 12.0–15.0)
MCH: 28.8 pg (ref 26.0–34.0)
MCHC: 34.5 g/dL (ref 30.0–36.0)
MCV: 83.3 fL (ref 78.0–100.0)
Platelets: 130 10*3/uL — ABNORMAL LOW (ref 150–400)
RBC: 3.65 MIL/uL — ABNORMAL LOW (ref 3.87–5.11)
RDW: 13.5 % (ref 11.5–15.5)
WBC: 7.3 10*3/uL (ref 4.0–10.5)

## 2013-04-20 MED ORDER — ENOXAPARIN SODIUM 30 MG/0.3ML ~~LOC~~ SOLN: 30.0000 mg | Freq: Two times a day (BID) | SUBCUTANEOUS | Status: DC

## 2013-04-20 MED ORDER — WARFARIN SODIUM 7.5 MG PO TABS: 7.5000 mg | ORAL_TABLET | Freq: Every day | ORAL | Status: DC

## 2013-04-20 NOTE — Progress Notes (Signed)
Physical Therapy Treatment Patient Details Name: Pamela Sims MRN: [id number] DOB: 12/01/1950 Today's Date: 04/20/2013 Time: 4098-1191 PT Time Calculation (min): 23 min  PT Assessment / Plan / Recommendation  History of Present Illness Pt. underwent R TKA for endstage OA   Clinical Impression Pt progressing well with PT goals & mobility at this date.  Safe to d/c home from PT standpoint when MD feels medically ready.         Follow Up Recommendations  Home health PT;Supervision/Assistance - 24 hour;Supervision for mobility/OOB     Does the patient have the potential to tolerate intense rehabilitation     Barriers to Discharge        Equipment Recommendations  None recommended by PT    Recommendations for Other Services    Frequency 7X/week   Progress towards PT Goals Progress towards PT goals: Progressing toward goals  Plan Current plan remains appropriate    Precautions / Restrictions Precautions Precautions: Knee Required Braces or Orthoses: Knee Immobilizer - Right Knee Immobilizer - Right: Other (comment) (until d/c'd) Restrictions RLE Weight Bearing: Weight bearing as tolerated   Pertinent Vitals/Pain 3/10 Rt knee.      Mobility  Bed Mobility Bed Mobility: Supine to Sit;Sitting - Scoot to Edge of Bed;Sit to Supine Supine to Sit: 6: Modified independent (Device/Increase time);HOB flat Sitting - Scoot to Edge of Bed: 6: Modified independent (Device/Increase time) Sit to Supine: 6: Modified independent (Device/Increase time);HOB flat Transfers Transfers: Sit to Stand;Stand to Sit Sit to Stand: 6: Modified independent (Device/Increase time);From bed;With upper extremity assist Stand to Sit: 6: Modified independent (Device/Increase time);With upper extremity assist;To bed Ambulation/Gait Ambulation/Gait Assistance: 5: Supervision Ambulation Distance (Feet): 220 Feet Assistive device: Rolling walker Ambulation/Gait Assistance Details: Pt c/o mild SOB.  Cues to  slow down & perform proper breathing technique.    Gait Pattern: Step-through pattern;Decreased stride length Stairs: No    Exercises Total Joint Exercises Ankle Circles/Pumps: AROM;Both;15 reps Quad Sets: AROM;Both;10 reps Heel Slides: AROM;Strengthening;Right;10 reps Hip ABduction/ADduction: AAROM;Strengthening;Right;10 reps Straight Leg Raises: AAROM;Strengthening;Right;10 reps     PT Goals (current goals can now be found in the care plan section) Acute Rehab PT Goals PT Goal Formulation: With patient Time For Goal Achievement: 04/25/13 Potential to Achieve Goals: Good  Visit Information  Last PT Received On: 04/20/13 Assistance Needed: +1 History of Present Illness: Pt. underwent R TKA for endstage OA    Subjective Data      Cognition  Cognition Arousal/Alertness: Awake/alert Behavior During Therapy: WFL for tasks assessed/performed Overall Cognitive Status: Within Functional Limits for tasks assessed    Balance     End of Session PT - End of Session Equipment Utilized During Treatment: Gait belt;Right knee immobilizer Activity Tolerance: Patient tolerated treatment well Patient left: in bed;with call bell/phone within reach;in CPM Nurse Communication: Mobility status   GP     Lara Mulch 04/20/2013, 8:23 AM   Verdell Face, PTA [phone] 04/20/2013
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
## 2013-04-20 NOTE — Progress Notes (Signed)
Patient called from home  - workers comp won't pay for doses of Coumadin and lovenox for tomorrow. Care Manager suggested DR call in a script for 1 day and patient pay out of pocket and get re-imbersed. Patient states has no money . PA notified Pamela Sims) and said to take Aspirin 325mg  twice tomorrow instead if absolutely cannot get Coumadin or Lovenox until Monday. Patient informed and agrees - states will try to get Coumadin, but if cannot will then take Aspirin as stated.

## 2013-04-30 NOTE — Progress Notes (Signed)
Late Entry: SW received a consult for possible placement. PT At this time is recommending home with HH and not SNF. CSW will make CM aware. Clinical Social Worker will sign off for now as social work intervention is no longer needed. Please consult us again if new need arises.  Raymar Joiner, MSW  [phone]  

## 2013-05-17 ENCOUNTER — Emergency Department (HOSPITAL_BASED_OUTPATIENT_CLINIC_OR_DEPARTMENT_OTHER)
Admission: EM | Admit: 2013-05-17 | Discharge: 2013-05-18 | Disposition: A | Discharge status: Home / Self Care | Payer: BC Managed Care – PPO | Attending: Emergency Medicine | Admitting: Emergency Medicine

## 2013-05-17 ENCOUNTER — Encounter (HOSPITAL_BASED_OUTPATIENT_CLINIC_OR_DEPARTMENT_OTHER): Payer: Self-pay | Admitting: Emergency Medicine

## 2013-05-17 DIAGNOSIS — F419 Anxiety disorder, unspecified: Secondary | ICD-10-CM

## 2013-05-17 DIAGNOSIS — Z8611 Personal history of tuberculosis: Secondary | ICD-10-CM | POA: Insufficient documentation

## 2013-05-17 DIAGNOSIS — R0789 Other chest pain: Secondary | ICD-10-CM | POA: Insufficient documentation

## 2013-05-17 DIAGNOSIS — F411 Generalized anxiety disorder: Secondary | ICD-10-CM | POA: Insufficient documentation

## 2013-05-17 DIAGNOSIS — I10 Essential (primary) hypertension: Secondary | ICD-10-CM | POA: Insufficient documentation

## 2013-05-17 DIAGNOSIS — J45909 Unspecified asthma, uncomplicated: Secondary | ICD-10-CM | POA: Insufficient documentation

## 2013-05-17 DIAGNOSIS — F3289 Other specified depressive episodes: Secondary | ICD-10-CM | POA: Insufficient documentation

## 2013-05-17 DIAGNOSIS — Z79899 Other long term (current) drug therapy: Secondary | ICD-10-CM | POA: Insufficient documentation

## 2013-05-17 DIAGNOSIS — R079 Chest pain, unspecified: Secondary | ICD-10-CM

## 2013-05-17 DIAGNOSIS — Z8719 Personal history of other diseases of the digestive system: Secondary | ICD-10-CM | POA: Insufficient documentation

## 2013-05-17 DIAGNOSIS — Z87891 Personal history of nicotine dependence: Secondary | ICD-10-CM | POA: Insufficient documentation

## 2013-05-17 DIAGNOSIS — R61 Generalized hyperhidrosis: Secondary | ICD-10-CM | POA: Insufficient documentation

## 2013-05-17 DIAGNOSIS — F909 Attention-deficit hyperactivity disorder, unspecified type: Secondary | ICD-10-CM | POA: Insufficient documentation

## 2013-05-17 DIAGNOSIS — Z87828 Personal history of other (healed) physical injury and trauma: Secondary | ICD-10-CM | POA: Insufficient documentation

## 2013-05-17 DIAGNOSIS — E876 Hypokalemia: Secondary | ICD-10-CM

## 2013-05-17 DIAGNOSIS — Z8739 Personal history of other diseases of the musculoskeletal system and connective tissue: Secondary | ICD-10-CM | POA: Insufficient documentation

## 2013-05-17 DIAGNOSIS — Z7901 Long term (current) use of anticoagulants: Secondary | ICD-10-CM | POA: Insufficient documentation

## 2013-05-17 DIAGNOSIS — F329 Major depressive disorder, single episode, unspecified: Secondary | ICD-10-CM | POA: Insufficient documentation

## 2013-05-17 DIAGNOSIS — Z8669 Personal history of other diseases of the nervous system and sense organs: Secondary | ICD-10-CM | POA: Insufficient documentation

## 2013-05-17 DIAGNOSIS — R0602 Shortness of breath: Secondary | ICD-10-CM | POA: Insufficient documentation

## 2013-05-17 NOTE — ED Notes (Addendum)
Bil low chest pain pt describes as a tightness that feels like a metal band around her and is causing her to be SOB. Pt having difficulty walking.  Sts she broke out in a sweat when it started. Pt took 2 Percocet, a Robaxin, and an antibiotic before leaving home.

## 2013-05-18 ENCOUNTER — Encounter (HOSPITAL_BASED_OUTPATIENT_CLINIC_OR_DEPARTMENT_OTHER): Payer: Self-pay | Admitting: Emergency Medicine

## 2013-05-18 ENCOUNTER — Emergency Department (HOSPITAL_BASED_OUTPATIENT_CLINIC_OR_DEPARTMENT_OTHER): Payer: BC Managed Care – PPO

## 2013-05-18 LAB — CBC WITH DIFFERENTIAL/PLATELET
Basophils Absolute: 0 10*3/uL (ref 0.0–0.1)
Basophils Relative: 0 % (ref 0–1)
Eosinophils Absolute: 0.2 10*3/uL (ref 0.0–0.7)
Eosinophils Relative: 2 % (ref 0–5)
HCT: 34.3 % — ABNORMAL LOW (ref 36.0–46.0)
Hemoglobin: 11.9 g/dL — ABNORMAL LOW (ref 12.0–15.0)
Lymphocytes Relative: 15 % (ref 12–46)
Lymphs Abs: 1.1 10*3/uL (ref 0.7–4.0)
MCH: 29.1 pg (ref 26.0–34.0)
MCHC: 34.7 g/dL (ref 30.0–36.0)
MCV: 83.9 fL (ref 78.0–100.0)
Monocytes Absolute: 0.9 10*3/uL (ref 0.1–1.0)
Monocytes Relative: 12 % (ref 3–12)
Neutro Abs: 5 10*3/uL (ref 1.7–7.7)
Neutrophils Relative %: 71 % (ref 43–77)
Platelets: 174 10*3/uL (ref 150–400)
RBC: 4.09 MIL/uL (ref 3.87–5.11)
RDW: 13.7 % (ref 11.5–15.5)
WBC: 7 10*3/uL (ref 4.0–10.5)

## 2013-05-18 LAB — URINALYSIS, ROUTINE W REFLEX MICROSCOPIC
Bilirubin Urine: NEGATIVE
Glucose, UA: NEGATIVE mg/dL
Hgb urine dipstick: NEGATIVE
Ketones, ur: 15 mg/dL — AB
Nitrite: NEGATIVE
Protein, ur: NEGATIVE mg/dL
Specific Gravity, Urine: 1.02 (ref 1.005–1.030)
Urobilinogen, UA: 1 mg/dL (ref 0.0–1.0)
pH: 7.5 (ref 5.0–8.0)

## 2013-05-18 LAB — TROPONIN I
Troponin I: <0.3 ng/mL (ref <0.30)
Troponin I: <0.3 ng/mL (ref <0.30)

## 2013-05-18 LAB — COMPREHENSIVE METABOLIC PANEL
ALT: 16 U/L (ref 0–35)
AST: 35 U/L (ref 0–37)
Albumin: 3.8 g/dL (ref 3.5–5.2)
Alkaline Phosphatase: 102 U/L (ref 39–117)
BUN: 19 mg/dL (ref 6–23)
CO2: 24 mEq/L (ref 19–32)
Calcium: 9.9 mg/dL (ref 8.4–10.5)
Chloride: 100 mEq/L (ref 96–112)
Creatinine, Ser: 1.1 mg/dL (ref 0.50–1.10)
GFR calc Af Amer: 62 mL/min — ABNORMAL LOW (ref >90)
GFR calc non Af Amer: 53 mL/min — ABNORMAL LOW (ref >90)
Glucose, Bld: 106 mg/dL — ABNORMAL HIGH (ref 70–99)
Potassium: 2.8 mEq/L — ABNORMAL LOW (ref 3.5–5.1)
Sodium: 139 mEq/L (ref 135–145)
Total Bilirubin: 0.6 mg/dL (ref 0.3–1.2)
Total Protein: 6.5 g/dL (ref 6.0–8.3)

## 2013-05-18 LAB — URINE MICROSCOPIC-ADD ON

## 2013-05-18 LAB — LIPASE, BLOOD: Lipase: 45 U/L (ref 11–59)

## 2013-05-18 MED ORDER — POTASSIUM CHLORIDE IN NACL 20-0.9 MEQ/L-% IV SOLN
Freq: Once | INTRAVENOUS | Status: AC
  Administered 2013-05-18: 01:00:00 via INTRAVENOUS
  Filled 2013-05-18: qty 1000

## 2013-05-18 MED ORDER — POTASSIUM CHLORIDE CRYS ER 20 MEQ PO TBCR: 20.0000 meq | EXTENDED_RELEASE_TABLET | Freq: Two times a day (BID) | ORAL | Status: DC

## 2013-05-18 MED ORDER — POTASSIUM CHLORIDE CRYS ER 20 MEQ PO TBCR
40.0000 meq | EXTENDED_RELEASE_TABLET | Freq: Once | ORAL | Status: AC
  Administered 2013-05-18: 40 meq via ORAL
  Filled 2013-05-18: qty 2

## 2013-05-18 NOTE — ED Notes (Signed)
Patient ambulates to restroom without difficulty or assistance.  

## 2013-05-18 NOTE — Discharge Instructions (Signed)
 Chest Pain (Nonspecific) It is often hard to give a specific diagnosis for the cause of chest pain. There is always a chance that your pain could be related to something serious, such as a heart attack or a blood clot in the lungs. You need to follow up with your caregiver for further evaluation. CAUSES   Heartburn.  Pneumonia or bronchitis.  Anxiety or stress.  Inflammation around your heart (pericarditis) or lung (pleuritis or pleurisy).  A blood clot in the lung.  A collapsed lung (pneumothorax). It can develop suddenly on its own (spontaneous pneumothorax) or from injury (trauma) to the chest.  Shingles infection (herpes zoster virus). The chest wall is composed of bones, muscles, and cartilage. Any of these can be the source of the pain.  The bones can be bruised by injury.  The muscles or cartilage can be strained by coughing or overwork.  The cartilage can be affected by inflammation and become sore (costochondritis). DIAGNOSIS  Lab tests or other studies, such as X-rays, electrocardiography, stress testing, or cardiac imaging, may be needed to find the cause of your pain.  TREATMENT   Treatment depends on what may be causing your chest pain. Treatment may include:  Acid blockers for heartburn.  Anti-inflammatory medicine.  Pain medicine for inflammatory conditions.  Antibiotics if an infection is present.  You may be advised to change lifestyle habits. This includes stopping smoking and avoiding alcohol, caffeine , and chocolate.  You may be advised to keep your head raised (elevated) when sleeping. This reduces the chance of acid going backward from your stomach into your esophagus.  Most of the time, nonspecific chest pain will improve within 2 to 3 days with rest and mild pain medicine. HOME CARE INSTRUCTIONS   If antibiotics were prescribed, take your antibiotics as directed. Finish them even if you start to feel better.  For the next few days, avoid physical  activities that bring on chest pain. Continue physical activities as directed.  Do not smoke.  Avoid drinking alcohol.  Only take over-the-counter or prescription medicine for pain, discomfort, or fever as directed by your caregiver.  Follow your caregiver's suggestions for further testing if your chest pain does not go away.  Keep any follow-up appointments you made. If you do not go to an appointment, you could develop lasting (chronic) problems with pain. If there is any problem keeping an appointment, you must call to reschedule. SEEK MEDICAL CARE IF:   You think you are having problems from the medicine you are taking. Read your medicine instructions carefully.  Your chest pain does not go away, even after treatment.  You develop a rash with blisters on your chest. SEEK IMMEDIATE MEDICAL CARE IF:   You have increased chest pain or pain that spreads to your arm, neck, jaw, back, or abdomen.  You develop shortness of breath, an increasing cough, or you are coughing up blood.  You have severe back or abdominal pain, feel nauseous, or vomit.  You develop severe weakness, fainting, or chills.  You have a fever. THIS IS AN EMERGENCY. Do not wait to see if the pain will go away. Get medical help at once. Call your local emergency services (911 in [place] Do not drive yourself to the hospital. MAKE SURE YOU:   Understand these instructions.  Will watch your condition.  Will get help right away if you are not doing well or get worse. Document Released: 06/22/2005 Document Revised: 12/05/2011 Document Reviewed: 04/17/2008 [facility] Patient Information 2014 [place].    Hypokalemia Hypokalemia means a low potassium level in the blood.Potassium is an electrolyte that helps regulate the amount of fluid in the body. It also stimulates muscle contraction and maintains a stable acid-base balance.Most of the body's potassium is inside of cells, and only a very small amount is in  the blood. Because the amount in the blood is so small, minor changes can have big effects. PREPARATION FOR TEST Testing for potassium requires taking a blood sample taken by needle from a vein in the arm. The skin is cleaned thoroughly before the sample is drawn. There is no other special preparation needed. NORMAL VALUES Potassium levels below 3.5 mEq/L are abnormally low. Levels above 5.1 mEq/L are abnormally high. Ranges for normal findings may vary among different laboratories and hospitals. You should always check with your doctor after having lab work or other tests done to discuss the meaning of your test results and whether your values are considered within normal limits. MEANING OF TEST  Your caregiver will go over the test results with you and discuss the importance and meaning of your results, as well as treatment options and the need for additional tests, if necessary. A potassium level is frequently part of a routine medical exam. It is usually included as part of a whole panel of tests for several blood salts (such as Sodium and Chloride). It may be done as part of follow-up when a low potassium level was found in the past or other blood salts are suspected of being out of balance. A low potassium level might be suspected if you have one or more of the following:  Symptoms of weakness.  Abnormal heart rhythms.  High blood pressure and are taking medication to control this, especially water pills (diuretics).  Kidney disease that can affect your potassium level .  Diabetes requiring the use of insulin. The potassium may fall after taking insulin, especially if the diabetes had been out of control for a while.  A condition requiring the use of cortisone-type medication or certain types of antibiotics.  Vomiting and/or diarrhea for more than a day or two.  A stomach or intestinal condition that may not permit appropriate absorption of potassium.  Fainting episodes.  Mental  confusion. OBTAINING TEST RESULTS It is your responsibility to obtain your test results. Ask the lab or department performing the test when and how you will get your results.  Please contact your caregiver directly if you have not received the results within one week. At that time, ask if there is anything different or new you should be doing in relation to the results. TREATMENT Hypokalemia can be treated with potassium supplements taken by mouth and/or adjustments in your current medications. A diet high in potassium is also helpful. Foods with high potassium content are:  Peas, lentils, lima beans, nuts, and dried fruit.  Whole grain and bran cereals and breads.  Fresh fruit, vegetables (bananas, cantaloupe, grapefruit, oranges, tomatoes, honeydew melons, potatoes).  Orange and tomato juices.  Meats. If potassium supplement has been prescribed for you today or your medications have been adjusted, see your personal caregiver in time02 for a re-check. SEEK MEDICAL CARE IF:  There is a feeling of worsening weakness.  You experience repeated chest palpitations.  You are diabetic and having difficulty keeping your blood sugars in the normal range.  You are experiencing vomiting and/or diarrhea.  You are having difficulty with any of your regular medications. SEEK IMMEDIATE MEDICAL CARE IF:  You  experience chest pain, shortness of breath, or episodes of dizziness.  You have been having vomiting or diarrhea for more than 2 days.  You have a fainting episode. MAKE SURE YOU:   Understand these instructions.  Will watch your condition.  Will get help right away if you are not doing well or get worse. Document Released: 09/12/2005 Document Revised: 12/05/2011 Document Reviewed: 08/23/2008 [facility] Patient Information 2014 [place], [place].

## 2013-05-18 NOTE — ED Notes (Signed)
Ice packs for knee and neck provided per request

## 2013-05-18 NOTE — ED Notes (Signed)
MD at bedside. 
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
## 2013-05-18 NOTE — ED Notes (Signed)
Patient with quick and steady gait when prompted

## 2013-05-20 LAB — URINE CULTURE
Colony Count: NO GROWTH
Culture: NO GROWTH

## 2013-05-21 ENCOUNTER — Ambulatory Visit (INDEPENDENT_AMBULATORY_CARE_PROVIDER_SITE_OTHER): Payer: BC Managed Care – PPO | Admitting: Family Medicine

## 2013-05-21 ENCOUNTER — Other Ambulatory Visit: Payer: Self-pay | Admitting: Family Medicine

## 2013-05-21 VITALS — BP 146/78 | HR 86 | Temp 98.0°F | Resp 16 | Ht 66.0 in | Wt 219.0 lb

## 2013-05-21 DIAGNOSIS — E876 Hypokalemia: Secondary | ICD-10-CM

## 2013-05-21 DIAGNOSIS — I10 Essential (primary) hypertension: Secondary | ICD-10-CM

## 2013-05-21 MED ORDER — VALSARTAN 320 MG PO TABS: 320.0000 mg | ORAL_TABLET | Freq: Every day | ORAL | Status: DC
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
## 2013-05-22 LAB — BASIC METABOLIC PANEL
BUN: 15 mg/dL (ref 6–23)
CO2: 31 mEq/L (ref 19–32)
Calcium: 9.8 mg/dL (ref 8.4–10.5)
Chloride: 103 mEq/L (ref 96–112)
Creat: 1.05 mg/dL (ref 0.50–1.10)
Glucose, Bld: 90 mg/dL (ref 70–99)
Potassium: 4 mEq/L (ref 3.5–5.3)
Sodium: 141 mEq/L (ref 135–145)

## 2013-05-23 ENCOUNTER — Telehealth: Payer: Self-pay

## 2013-05-23 NOTE — Telephone Encounter (Signed)
Patient is concerned about her potassium levels.  Was supposed to here from Dr.Le yesterday. Please call at [phone].

## 2013-05-23 NOTE — Telephone Encounter (Signed)
LM that her potassium was totally normal. I would like her to continue with Valsartan 320 mg  and see how she does without HCTZ. She will come back in 1 week for just labs only and get a lab draw to see if her BMP and  blood pressure is ok just on Valsartan, measure BP. ID her feet start swelling or she had BP > 140/90 then need to let me know .

## 2013-05-31 ENCOUNTER — Other Ambulatory Visit (INDEPENDENT_AMBULATORY_CARE_PROVIDER_SITE_OTHER): Payer: BC Managed Care – PPO

## 2013-05-31 DIAGNOSIS — I10 Essential (primary) hypertension: Secondary | ICD-10-CM

## 2013-05-31 LAB — BASIC METABOLIC PANEL
BUN: 14 mg/dL (ref 6–23)
CO2: 29 mEq/L (ref 19–32)
Calcium: 9.3 mg/dL (ref 8.4–10.5)
Chloride: 105 mEq/L (ref 96–112)
Creat: 0.9 mg/dL (ref 0.50–1.10)
Glucose, Bld: 84 mg/dL (ref 70–99)
Potassium: 4.2 mEq/L (ref 3.5–5.3)
Sodium: 142 mEq/L (ref 135–145)

## 2013-05-31 NOTE — Progress Notes (Signed)
Patient here for labs only. 

## 2013-06-05 ENCOUNTER — Telehealth: Payer: Self-pay | Admitting: Family Medicine

## 2013-06-05 DIAGNOSIS — I10 Essential (primary) hypertension: Secondary | ICD-10-CM

## 2013-06-05 MED ORDER — CHLORTHALIDONE 25 MG PO TABS: 25.0000 mg | ORAL_TABLET | Freq: Every day | ORAL | Status: DC

## 2013-06-05 NOTE — Telephone Encounter (Signed)
Spoke to patient, BP high on just Valsartan, she needs to be on diuretic, will add  Chlorthalidone and

## 2013-07-25 ENCOUNTER — Ambulatory Visit
Admission: RE | Admit: 2013-07-25 | Discharge: 2013-07-25 | Disposition: A | Discharge status: Home / Self Care | Payer: BC Managed Care – PPO | Source: Ambulatory Visit

## 2013-07-25 ENCOUNTER — Other Ambulatory Visit: Payer: Self-pay

## 2013-07-25 DIAGNOSIS — Z1231 Encounter for screening mammogram for malignant neoplasm of breast: Secondary | ICD-10-CM

## 2013-08-28 IMAGING — CR DG CHEST 2V
2 series · 2 of 2 positions shown · non-contrast
Comparison: Two-view chest 07/29/2009.

CLINICAL DATA: Right total knee arthroplasty.

CHEST - 2 VIEW

[w chest pa]
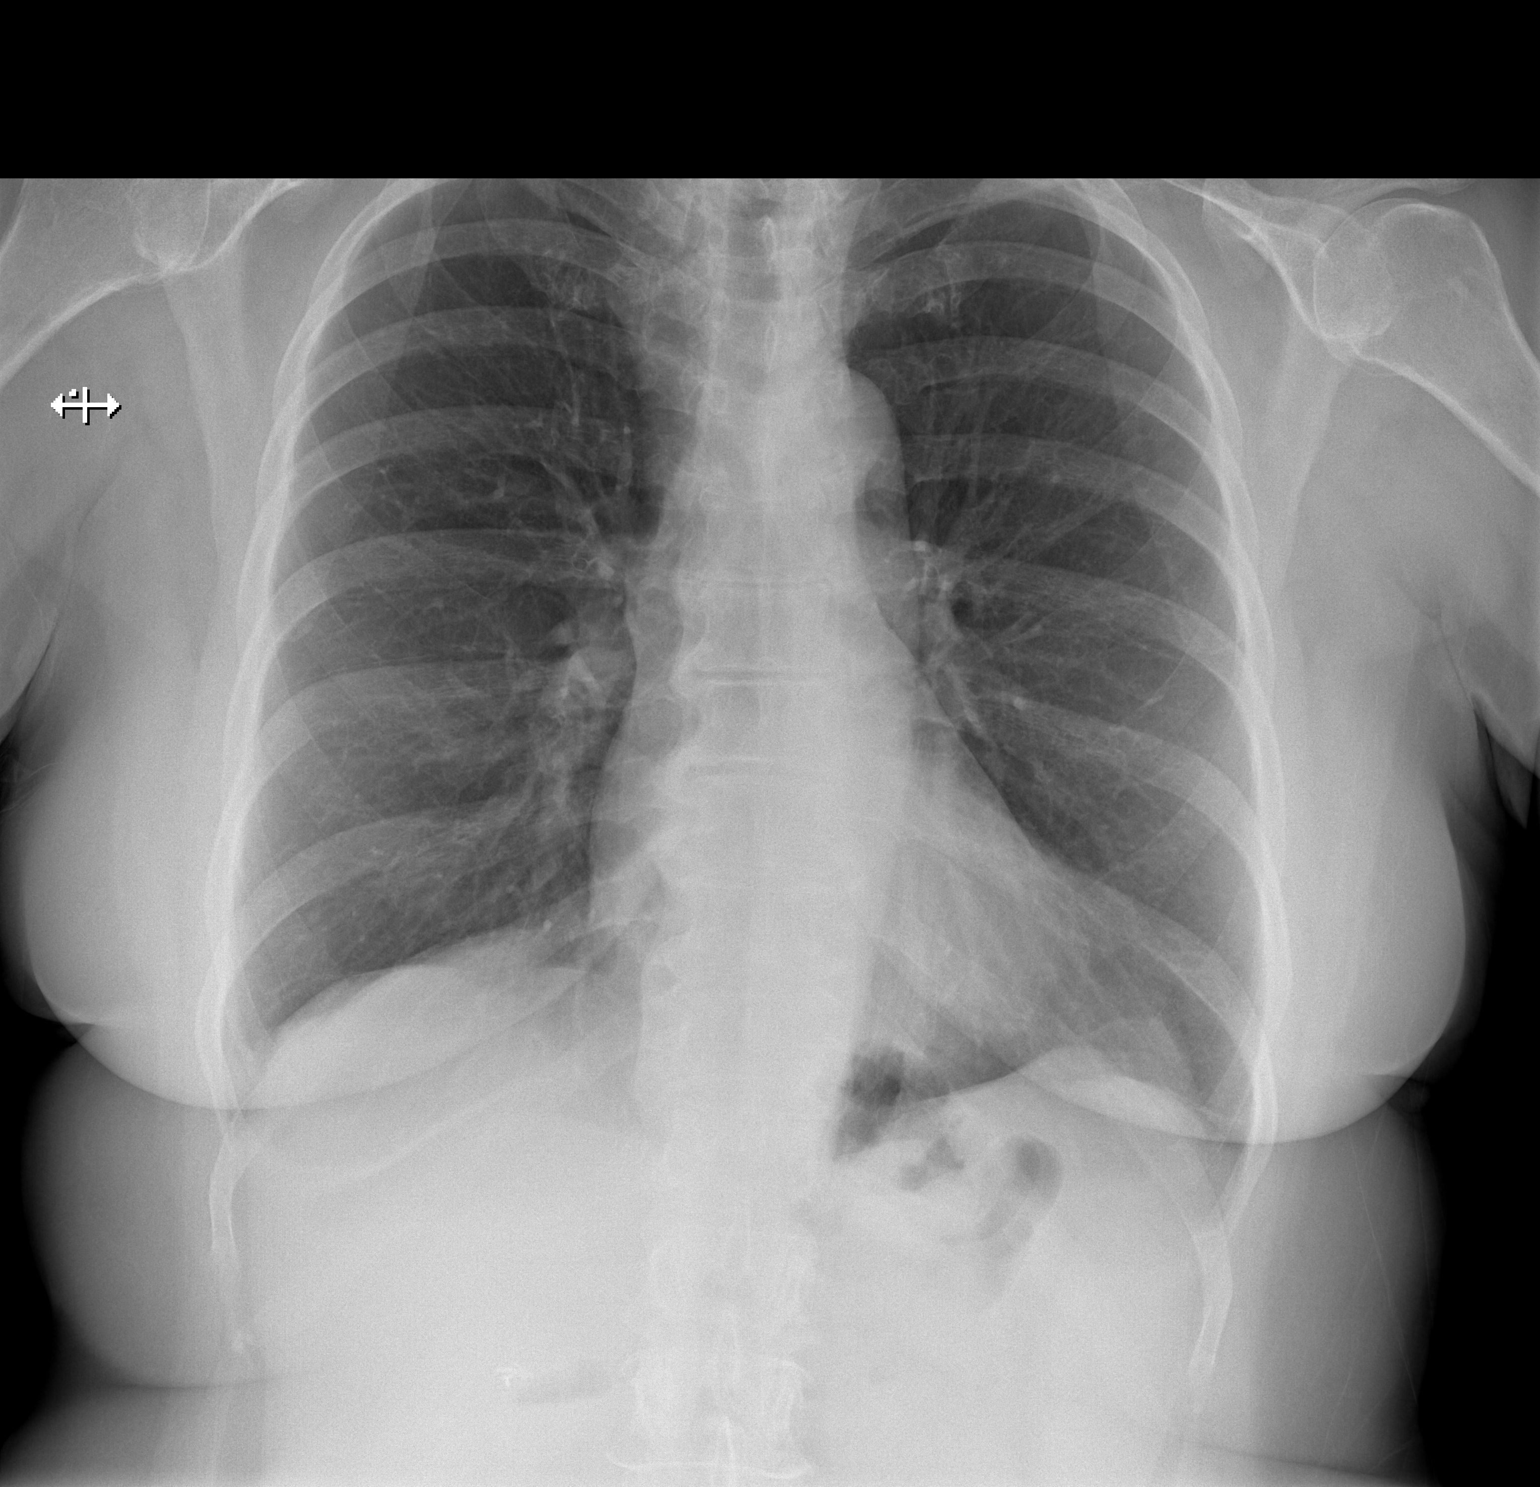

[w chest lat]
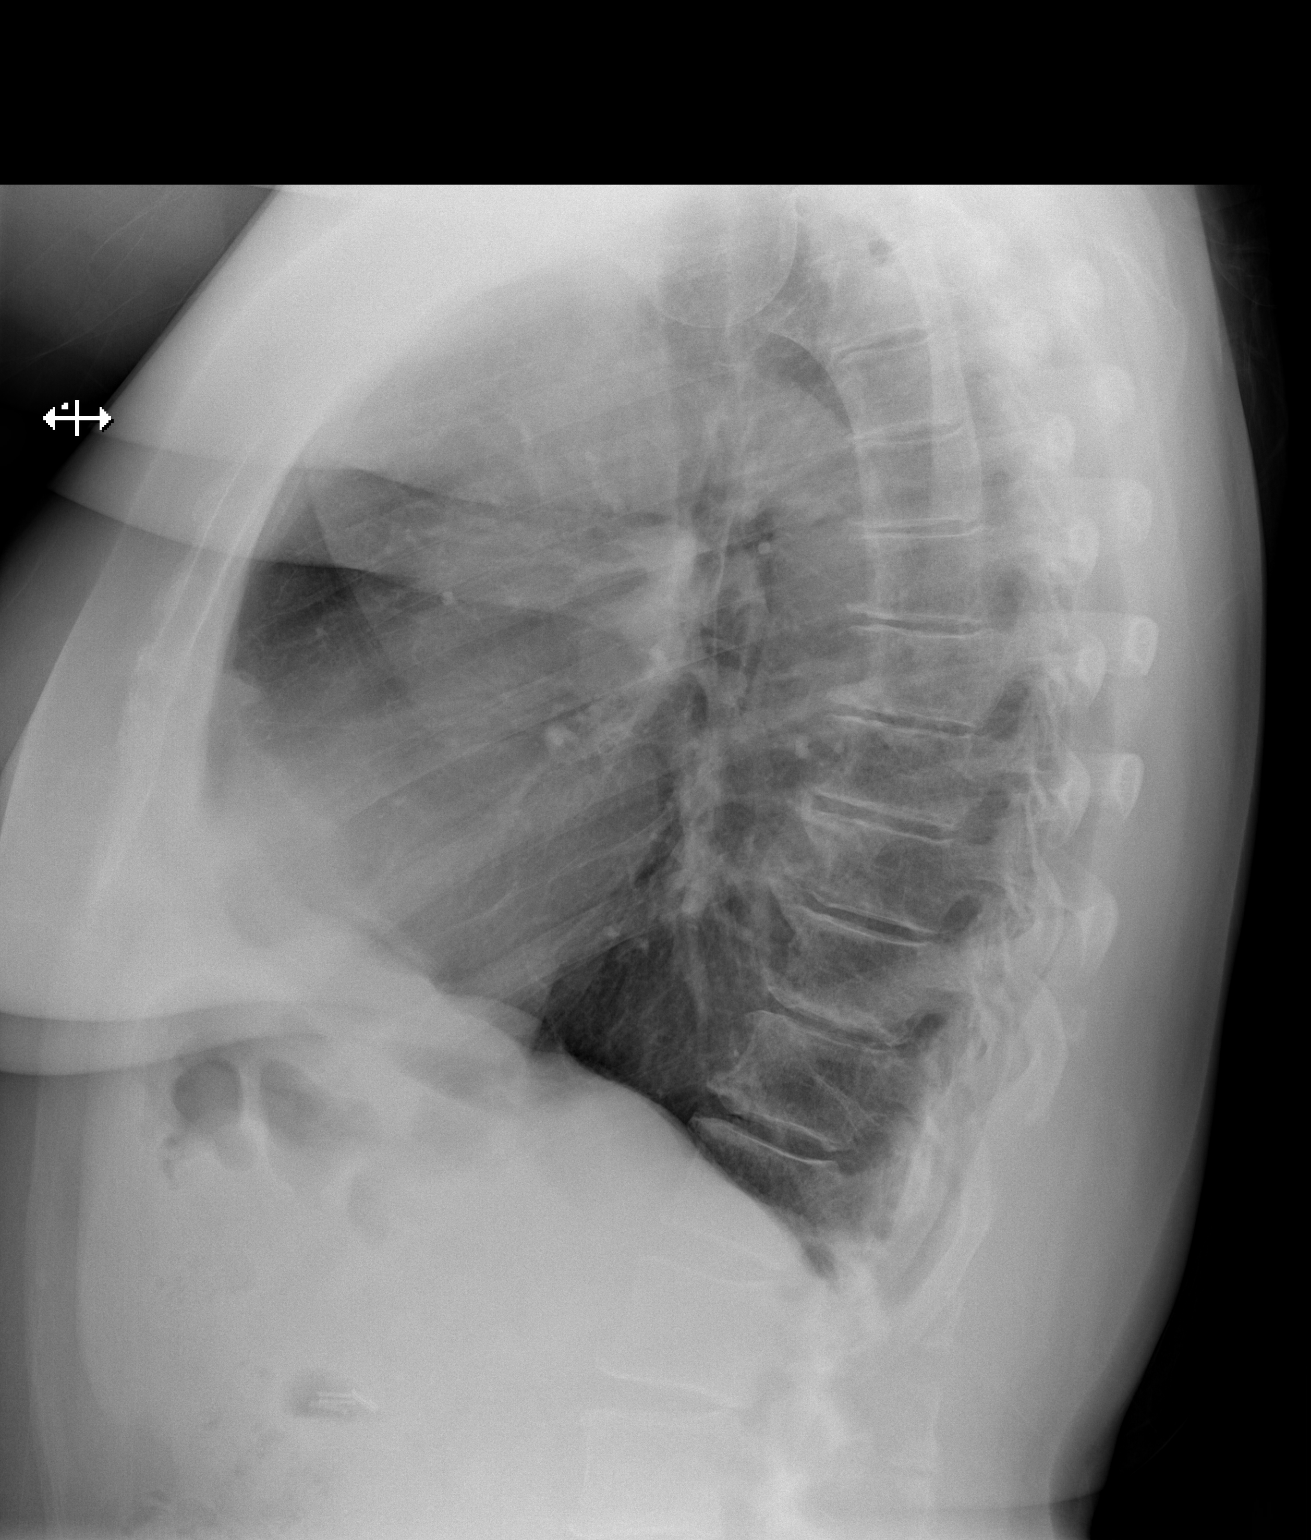

[2 of 2 positions shown; findings below may reference images not displayed]

FINDINGS: The heart size is normal.  Of sinus changes are evident.
No focal airspace disease is present.  Mild degenerative changes
are noted within the thoracic spine.  Surgical clips are present at
the gallbladder fossa.
IMPRESSION: 1.  Emphysema.
2.  No acute cardiopulmonary disease or significant interval
change.

## 2013-10-07 ENCOUNTER — Other Ambulatory Visit: Payer: Self-pay | Admitting: Physician Assistant

## 2013-10-09 ENCOUNTER — Other Ambulatory Visit: Payer: Self-pay | Admitting: Physician Assistant

## 2013-10-30 ENCOUNTER — Other Ambulatory Visit: Payer: Self-pay | Admitting: Physician Assistant

## 2013-11-06 ENCOUNTER — Telehealth: Payer: Self-pay

## 2013-11-06 NOTE — Telephone Encounter (Signed)
PT STATES WE ONLY GAVE HER 1 MONTH OF THE BP MEDS AND THE PHARMACY WOULDN'T REFILL IT WITHOUT HER BEING SEEN AND SHE WANTS TO KNOW WHY, IS ABOUT OUT PLEASE CALL [place] ON [address]

## 2013-11-09 MED ORDER — VALSARTAN 320 MG PO TABS: 320.0000 mg | ORAL_TABLET | Freq: Every day | ORAL | Status: DC

## 2013-11-09 NOTE — Telephone Encounter (Signed)
Spoke with pt advised her that it is time for office visit but I will send in 1 month.

## 2013-11-25 ENCOUNTER — Other Ambulatory Visit: Payer: Self-pay | Admitting: Physician Assistant

## 2013-11-25 ENCOUNTER — Other Ambulatory Visit: Payer: Self-pay | Admitting: Family Medicine

## 2013-11-26 ENCOUNTER — Other Ambulatory Visit: Payer: Self-pay | Admitting: Physician Assistant

## 2013-12-25 ENCOUNTER — Ambulatory Visit (INDEPENDENT_AMBULATORY_CARE_PROVIDER_SITE_OTHER): Payer: BC Managed Care – PPO | Admitting: Family Medicine

## 2013-12-25 ENCOUNTER — Other Ambulatory Visit: Payer: Self-pay | Admitting: Family Medicine

## 2013-12-25 VITALS — BP 116/64 | HR 77 | Temp 98.0°F | Resp 16 | Ht 65.5 in | Wt 230.2 lb

## 2013-12-25 DIAGNOSIS — I10 Essential (primary) hypertension: Secondary | ICD-10-CM

## 2013-12-25 DIAGNOSIS — Z01810 Encounter for preprocedural cardiovascular examination: Secondary | ICD-10-CM

## 2013-12-25 LAB — POCT CBC
Granulocyte percent: 74.4 %G (ref 37–80)
HCT, POC: 41.6 % (ref 37.7–47.9)
Hemoglobin: 13.4 g/dL (ref 12.2–16.2)
Lymph, poc: 1.1 (ref 0.6–3.4)
MCH, POC: 29.5 pg (ref 27–31.2)
MCHC: 32.2 g/dL (ref 31.8–35.4)
MCV: 91.7 fL (ref 80–97)
MID (cbc): 0.4 (ref 0–0.9)
MPV: 10.2 fL (ref 0–99.8)
POC Granulocyte: 3.6 (ref 2–6.9)
POC LYMPH PERCENT: 21.1 %L (ref 10–50)
POC MID %: 7.5 %M (ref 0–12)
Platelet Count, POC: 180 10*3/uL (ref 142–424)
RBC: 4.54 M/uL (ref 4.04–5.48)
RDW, POC: 14.7 %
WBC: 5.1 10*3/uL (ref 4.6–10.2)

## 2013-12-25 LAB — COMPREHENSIVE METABOLIC PANEL
ALT: 12 U/L (ref 0–35)
AST: 13 U/L (ref 0–37)
Albumin: 4.3 g/dL (ref 3.5–5.2)
Alkaline Phosphatase: 62 U/L (ref 39–117)
BUN: 13 mg/dL (ref 6–23)
CO2: 32 mEq/L (ref 19–32)
Calcium: 9.5 mg/dL (ref 8.4–10.5)
Chloride: 102 mEq/L (ref 96–112)
Creat: 0.84 mg/dL (ref 0.50–1.10)
Glucose, Bld: 95 mg/dL (ref 70–99)
Potassium: 4.5 mEq/L (ref 3.5–5.3)
Sodium: 143 mEq/L (ref 135–145)
Total Bilirubin: 0.5 mg/dL (ref 0.2–1.2)
Total Protein: 6.2 g/dL (ref 6.0–8.3)

## 2013-12-25 MED ORDER — CHLORTHALIDONE 25 MG PO TABS: ORAL_TABLET | ORAL | Status: DC

## 2013-12-25 MED ORDER — VALSARTAN 320 MG PO TABS: ORAL_TABLET | ORAL | Status: DC

## 2013-12-25 MED ORDER — VALSARTAN 320 MG PO TABS: 320.0000 mg | ORAL_TABLET | Freq: Every day | ORAL | Status: DC

## 2013-12-25 MED ORDER — CHLORTHALIDONE 25 MG PO TABS: 25.0000 mg | ORAL_TABLET | Freq: Every day | ORAL | Status: DC
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
## 2014-01-28 ENCOUNTER — Encounter (HOSPITAL_COMMUNITY): Payer: Self-pay | Admitting: Pharmacy Technician

## 2014-01-31 ENCOUNTER — Encounter (HOSPITAL_COMMUNITY): Payer: Self-pay

## 2014-01-31 ENCOUNTER — Encounter (HOSPITAL_COMMUNITY)
Admission: RE | Admit: 2014-01-31 | Discharge: 2014-01-31 | Disposition: A | Discharge status: Home / Self Care | Payer: Worker's Compensation | Source: Ambulatory Visit | Attending: Orthopedic Surgery | Admitting: Orthopedic Surgery

## 2014-01-31 ENCOUNTER — Ambulatory Visit (HOSPITAL_COMMUNITY)
Admission: RE | Admit: 2014-01-31 | Discharge: 2014-01-31 | Disposition: A | Discharge status: Home / Self Care | Payer: Worker's Compensation | Source: Ambulatory Visit | Attending: Orthopedic Surgery | Admitting: Orthopedic Surgery

## 2014-01-31 DIAGNOSIS — M47812 Spondylosis without myelopathy or radiculopathy, cervical region: Secondary | ICD-10-CM | POA: Insufficient documentation

## 2014-01-31 DIAGNOSIS — Z01812 Encounter for preprocedural laboratory examination: Secondary | ICD-10-CM | POA: Insufficient documentation

## 2014-01-31 HISTORY — DX: Shortness of breath: R06.02

## 2014-01-31 LAB — CBC
HCT: 40.2 % (ref 36.0–46.0)
Hemoglobin: 13.6 g/dL (ref 12.0–15.0)
MCH: 29.3 pg (ref 26.0–34.0)
MCHC: 33.8 g/dL (ref 30.0–36.0)
MCV: 86.6 fL (ref 78.0–100.0)
Platelets: 194 10*3/uL (ref 150–400)
RBC: 4.64 MIL/uL (ref 3.87–5.11)
RDW: 13.6 % (ref 11.5–15.5)
WBC: 6.3 10*3/uL (ref 4.0–10.5)

## 2014-01-31 LAB — BASIC METABOLIC PANEL
BUN: 12 mg/dL (ref 6–23)
CO2: 29 mEq/L (ref 19–32)
Calcium: 9.7 mg/dL (ref 8.4–10.5)
Chloride: 102 mEq/L (ref 96–112)
Creatinine, Ser: 0.83 mg/dL (ref 0.50–1.10)
GFR calc Af Amer: 86 mL/min — ABNORMAL LOW (ref >90)
GFR calc non Af Amer: 75 mL/min — ABNORMAL LOW (ref >90)
Glucose, Bld: 80 mg/dL (ref 70–99)
Potassium: 4.3 mEq/L (ref 3.7–5.3)
Sodium: 143 mEq/L (ref 137–147)

## 2014-01-31 NOTE — Pre-Procedure Instructions (Signed)
CAIYA BETTES  01/31/2014   Your procedure is scheduled on:  Thursday, May 14.  Report to [facility] Admitting at 11:00AM.  Call this number if you have problems the morning of surgery: [phone]   Remember:   Do not eat food or drink liquids after midnight Wednesday, May 13.  Take these medicines the morning of surgery with A SIP OF WATER: ARIPiprazole (ABILIFY), escitalopram (LEXAPRO).              Stop taking naproxen sodium (ANAPROX), do not take Aspirin or any Aspirin products., Ibuprofen (Advil), Herbal Medications and Vitamins.    Do not wear jewelry, make-up or nail polish.  Do not wear lotions, powders, or perfumes.   Do not shave 48 hours prior to surgery.   Do not bring valuables to the hospital.             [facility] is not responsible  for any belongings or valuables.               Contacts, dentures or bridgework may not be worn into surgery.  Leave suitcase in the car. After surgery it may be brought to your room.  For patients admitted to the hospital, discharge time is determined by your treatment team.               Patients discharged the day of surgery will not be allowed to drive home.  Name and phone number of your driver: -   Special Instructions: Review  [facility] - Preparing For Surgery.   Please read over the following fact sheets that you were given: Pain Booklet, Coughing and Deep Breathing and Surgical Site Infection Prevention

## 2014-01-31 NOTE — Progress Notes (Signed)
I spoke with Levada Dy in [employer] and she did not find patients PCR in the lab.  Will do PCR day of surgery.

## 2014-02-05 MED ORDER — CEFAZOLIN SODIUM-DEXTROSE 2-3 GM-% IV SOLR
2.0000 g | INTRAVENOUS | Status: AC
  Administered 2014-02-06: 2 g via INTRAVENOUS
  Filled 2014-02-05: qty 50

## 2014-02-05 MED ORDER — DEXAMETHASONE SODIUM PHOSPHATE 4 MG/ML IJ SOLN
8.0000 mg | Freq: Once | INTRAMUSCULAR | Status: AC
  Administered 2014-02-06: 10 mg via INTRAVENOUS
  Filled 2014-02-05: qty 2

## 2014-02-05 MED ORDER — ACETAMINOPHEN 10 MG/ML IV SOLN
1000.0000 mg | Freq: Four times a day (QID) | INTRAVENOUS | Status: DC
  Administered 2014-02-06: 1000 mg via INTRAVENOUS
  Filled 2014-02-05: qty 100

## 2014-02-06 ENCOUNTER — Encounter (HOSPITAL_COMMUNITY): Payer: Worker's Compensation | Admitting: Anesthesiology

## 2014-02-06 ENCOUNTER — Encounter (HOSPITAL_COMMUNITY)
Admission: RE | Disposition: A | Discharge status: Home / Self Care | Payer: BC Managed Care – PPO | Source: Ambulatory Visit | Attending: Orthopedic Surgery

## 2014-02-06 ENCOUNTER — Ambulatory Visit (HOSPITAL_COMMUNITY): Payer: Worker's Compensation

## 2014-02-06 ENCOUNTER — Ambulatory Visit (HOSPITAL_COMMUNITY)
Admission: RE | Admit: 2014-02-06 | Discharge: 2014-02-07 | Disposition: A | Discharge status: Home / Self Care | Payer: Worker's Compensation | Source: Ambulatory Visit | Attending: Orthopedic Surgery | Admitting: Orthopedic Surgery

## 2014-02-06 ENCOUNTER — Encounter (HOSPITAL_COMMUNITY): Payer: Self-pay | Admitting: Anesthesiology

## 2014-02-06 ENCOUNTER — Ambulatory Visit (HOSPITAL_COMMUNITY): Payer: Worker's Compensation | Admitting: Anesthesiology

## 2014-02-06 DIAGNOSIS — Z96659 Presence of unspecified artificial knee joint: Secondary | ICD-10-CM | POA: Insufficient documentation

## 2014-02-06 DIAGNOSIS — M1990 Unspecified osteoarthritis, unspecified site: Secondary | ICD-10-CM | POA: Insufficient documentation

## 2014-02-06 DIAGNOSIS — M542 Cervicalgia: Secondary | ICD-10-CM | POA: Diagnosis present

## 2014-02-06 DIAGNOSIS — F329 Major depressive disorder, single episode, unspecified: Secondary | ICD-10-CM | POA: Insufficient documentation

## 2014-02-06 DIAGNOSIS — K589 Irritable bowel syndrome without diarrhea: Secondary | ICD-10-CM | POA: Insufficient documentation

## 2014-02-06 DIAGNOSIS — J45909 Unspecified asthma, uncomplicated: Secondary | ICD-10-CM | POA: Insufficient documentation

## 2014-02-06 DIAGNOSIS — Z981 Arthrodesis status: Secondary | ICD-10-CM

## 2014-02-06 DIAGNOSIS — I10 Essential (primary) hypertension: Secondary | ICD-10-CM | POA: Insufficient documentation

## 2014-02-06 DIAGNOSIS — F909 Attention-deficit hyperactivity disorder, unspecified type: Secondary | ICD-10-CM | POA: Insufficient documentation

## 2014-02-06 DIAGNOSIS — F3289 Other specified depressive episodes: Secondary | ICD-10-CM | POA: Insufficient documentation

## 2014-02-06 DIAGNOSIS — Z6834 Body mass index (BMI) 34.0-34.9, adult: Secondary | ICD-10-CM | POA: Insufficient documentation

## 2014-02-06 DIAGNOSIS — Z87891 Personal history of nicotine dependence: Secondary | ICD-10-CM | POA: Insufficient documentation

## 2014-02-06 DIAGNOSIS — K449 Diaphragmatic hernia without obstruction or gangrene: Secondary | ICD-10-CM | POA: Insufficient documentation

## 2014-02-06 DIAGNOSIS — M47812 Spondylosis without myelopathy or radiculopathy, cervical region: Secondary | ICD-10-CM | POA: Insufficient documentation

## 2014-02-06 DIAGNOSIS — G43909 Migraine, unspecified, not intractable, without status migrainosus: Secondary | ICD-10-CM | POA: Insufficient documentation

## 2014-02-06 DIAGNOSIS — E669 Obesity, unspecified: Secondary | ICD-10-CM | POA: Insufficient documentation

## 2014-02-06 DIAGNOSIS — K219 Gastro-esophageal reflux disease without esophagitis: Secondary | ICD-10-CM | POA: Insufficient documentation

## 2014-02-06 DIAGNOSIS — F411 Generalized anxiety disorder: Secondary | ICD-10-CM | POA: Insufficient documentation

## 2014-02-06 HISTORY — DX: Family history of other specified conditions: Z84.89

## 2014-02-06 HISTORY — PX: CERVICAL DISCECTOMY: SHX98

## 2014-02-06 HISTORY — PX: ANTERIOR CERVICAL DECOMP/DISCECTOMY FUSION: SHX1161

## 2014-02-06 HISTORY — DX: Cervicalgia: M54.2

## 2014-02-06 LAB — SURGICAL PCR SCREEN
MRSA, PCR: NEGATIVE
Staphylococcus aureus: NEGATIVE

## 2014-02-06 SURGERY — ANTERIOR CERVICAL DECOMPRESSION/DISCECTOMY FUSION 2 LEVELS
Anesthesia: General | Site: Spine Cervical

## 2014-02-06 MED ORDER — ONDANSETRON HCL 4 MG/2ML IJ SOLN: 4.0000 mg | INTRAMUSCULAR | Status: DC | PRN

## 2014-02-06 MED ORDER — SODIUM CHLORIDE 0.9 % IJ SOLN
INTRAMUSCULAR | Status: AC
  Filled 2014-02-06: qty 10

## 2014-02-06 MED ORDER — 0.9 % SODIUM CHLORIDE (POUR BTL) OPTIME
TOPICAL | Status: DC | PRN
  Administered 2014-02-06: 1000 mL

## 2014-02-06 MED ORDER — MUPIROCIN 2 % EX OINT
TOPICAL_OINTMENT | CUTANEOUS | Status: AC
  Administered 2014-02-06: 1 via NASAL
  Filled 2014-02-06: qty 22

## 2014-02-06 MED ORDER — LIDOCAINE HCL (CARDIAC) 20 MG/ML IV SOLN
INTRAVENOUS | Status: AC
  Filled 2014-02-06: qty 5

## 2014-02-06 MED ORDER — MORPHINE SULFATE 2 MG/ML IJ SOLN: 1.0000 mg | INTRAMUSCULAR | Status: DC | PRN

## 2014-02-06 MED ORDER — METOCLOPRAMIDE HCL 5 MG/ML IJ SOLN
INTRAMUSCULAR | Status: AC
  Filled 2014-02-06: qty 2

## 2014-02-06 MED ORDER — LIDOCAINE HCL (CARDIAC) 20 MG/ML IV SOLN: INTRAVENOUS | Status: DC | PRN

## 2014-02-06 MED ORDER — SODIUM CHLORIDE 0.9 % IJ SOLN: 3.0000 mL | INTRAMUSCULAR | Status: DC | PRN

## 2014-02-06 MED ORDER — NEOSTIGMINE METHYLSULFATE 10 MG/10ML IV SOLN
INTRAVENOUS | Status: DC | PRN
  Administered 2014-02-06: 5 mg via INTRAVENOUS

## 2014-02-06 MED ORDER — METHOCARBAMOL 1000 MG/10ML IJ SOLN
500.0000 mg | Freq: Four times a day (QID) | INTRAVENOUS | Status: DC | PRN
  Filled 2014-02-06: qty 5

## 2014-02-06 MED ORDER — DEXAMETHASONE 4 MG PO TABS
4.0000 mg | ORAL_TABLET | Freq: Four times a day (QID) | ORAL | Status: DC
  Administered 2014-02-07: 4 mg via ORAL
  Filled 2014-02-06 (×7): qty 1

## 2014-02-06 MED ORDER — ROCURONIUM BROMIDE 50 MG/5ML IV SOLN
INTRAVENOUS | Status: AC
  Filled 2014-02-06: qty 1

## 2014-02-06 MED ORDER — MEPERIDINE HCL 25 MG/ML IJ SOLN: 6.2500 mg | INTRAMUSCULAR | Status: DC | PRN

## 2014-02-06 MED ORDER — NOREPINEPHRINE BITARTRATE 1 MG/ML IV SOLN
2.0000 ug/min | INTRAVENOUS | Status: DC
  Filled 2014-02-06: qty 4

## 2014-02-06 MED ORDER — NEOSTIGMINE METHYLSULFATE 10 MG/10ML IV SOLN
INTRAVENOUS | Status: AC
  Filled 2014-02-06: qty 1

## 2014-02-06 MED ORDER — ONDANSETRON HCL 4 MG/2ML IJ SOLN
INTRAMUSCULAR | Status: DC | PRN
  Administered 2014-02-06: 4 mg via INTRAVENOUS

## 2014-02-06 MED ORDER — GLYCOPYRROLATE 0.2 MG/ML IJ SOLN
INTRAMUSCULAR | Status: AC
  Filled 2014-02-06: qty 3

## 2014-02-06 MED ORDER — CEFAZOLIN SODIUM 1-5 GM-% IV SOLN
1.0000 g | Freq: Three times a day (TID) | INTRAVENOUS | Status: AC
  Administered 2014-02-06 – 2014-02-07 (×2): 1 g via INTRAVENOUS
  Filled 2014-02-06 (×2): qty 50

## 2014-02-06 MED ORDER — ARIPIPRAZOLE 5 MG PO TABS
5.0000 mg | ORAL_TABLET | Freq: Every day | ORAL | Status: DC
  Filled 2014-02-06: qty 1

## 2014-02-06 MED ORDER — IRBESARTAN 75 MG PO TABS
75.0000 mg | ORAL_TABLET | Freq: Every day | ORAL | Status: DC
  Filled 2014-02-06: qty 1

## 2014-02-06 MED ORDER — SODIUM CHLORIDE 0.9 % IV SOLN: 250.0000 mL | INTRAVENOUS | Status: DC

## 2014-02-06 MED ORDER — PROPOFOL 10 MG/ML IV BOLUS
INTRAVENOUS | Status: AC
  Filled 2014-02-06: qty 20

## 2014-02-06 MED ORDER — DEXAMETHASONE SODIUM PHOSPHATE 4 MG/ML IJ SOLN
4.0000 mg | Freq: Four times a day (QID) | INTRAMUSCULAR | Status: DC
  Administered 2014-02-06 – 2014-02-07 (×2): 4 mg via INTRAVENOUS
  Filled 2014-02-06 (×7): qty 1

## 2014-02-06 MED ORDER — PHENOL 1.4 % MT LIQD: 1.0000 | OROMUCOSAL | Status: DC | PRN

## 2014-02-06 MED ORDER — EPHEDRINE SULFATE 50 MG/ML IJ SOLN
INTRAMUSCULAR | Status: AC
  Filled 2014-02-06: qty 1

## 2014-02-06 MED ORDER — VECURONIUM BROMIDE 10 MG IV SOLR
INTRAVENOUS | Status: DC | PRN
  Administered 2014-02-06: 2 mg via INTRAVENOUS

## 2014-02-06 MED ORDER — METHOCARBAMOL 500 MG PO TABS: 500.0000 mg | ORAL_TABLET | Freq: Four times a day (QID) | ORAL | Status: DC | PRN

## 2014-02-06 MED ORDER — EPHEDRINE SULFATE 50 MG/ML IJ SOLN
INTRAMUSCULAR | Status: DC | PRN
  Administered 2014-02-06: 5 mg via INTRAVENOUS
  Administered 2014-02-06: 10 mg via INTRAVENOUS
  Administered 2014-02-06: 5 mg via INTRAVENOUS

## 2014-02-06 MED ORDER — MENTHOL 3 MG MT LOZG
1.0000 | LOZENGE | OROMUCOSAL | Status: DC | PRN
Start: 2014-02-06 — End: 2014-02-07
  Filled 2014-02-06 (×2): qty 9

## 2014-02-06 MED ORDER — METOCLOPRAMIDE HCL 5 MG/ML IJ SOLN: 10.0000 mg | Freq: Once | INTRAMUSCULAR | Status: DC | PRN

## 2014-02-06 MED ORDER — METOCLOPRAMIDE HCL 5 MG/ML IJ SOLN
INTRAMUSCULAR | Status: DC | PRN
  Administered 2014-02-06: 10 mg via INTRAVENOUS

## 2014-02-06 MED ORDER — THROMBIN 20000 UNITS EX KIT
PACK | CUTANEOUS | Status: DC | PRN
  Administered 2014-02-06: 15:00:00 via TOPICAL

## 2014-02-06 MED ORDER — OXYCODONE HCL 5 MG PO TABS: 10.0000 mg | ORAL_TABLET | ORAL | Status: DC | PRN

## 2014-02-06 MED ORDER — LIDOCAINE HCL (CARDIAC) 20 MG/ML IV SOLN
INTRAVENOUS | Status: AC
  Filled 2014-02-06: qty 10

## 2014-02-06 MED ORDER — PROPOFOL 10 MG/ML IV BOLUS
INTRAVENOUS | Status: DC | PRN
  Administered 2014-02-06: 160 mg via INTRAVENOUS

## 2014-02-06 MED ORDER — BUPIVACAINE-EPINEPHRINE (PF) 0.25% -1:200000 IJ SOLN
INTRAMUSCULAR | Status: AC
  Filled 2014-02-06: qty 30

## 2014-02-06 MED ORDER — HYDROMORPHONE HCL PF 1 MG/ML IJ SOLN
0.2500 mg | INTRAMUSCULAR | Status: DC | PRN
  Administered 2014-02-06: 0.5 mg via INTRAVENOUS

## 2014-02-06 MED ORDER — LACTATED RINGERS IV SOLN
INTRAVENOUS | Status: DC
  Administered 2014-02-06: 12:00:00 via INTRAVENOUS

## 2014-02-06 MED ORDER — OXYCODONE HCL 5 MG/5ML PO SOLN: 5.0000 mg | Freq: Once | ORAL | Status: AC | PRN

## 2014-02-06 MED ORDER — METHYLPHENIDATE HCL ER (OSM) 18 MG PO TBCR
54.0000 mg | EXTENDED_RELEASE_TABLET | Freq: Every day | ORAL | Status: DC
  Administered 2014-02-07: 54 mg via ORAL
  Filled 2014-02-06: qty 3

## 2014-02-06 MED ORDER — OXYCODONE HCL 5 MG PO TABS
5.0000 mg | ORAL_TABLET | Freq: Once | ORAL | Status: AC | PRN
  Administered 2014-02-06: 5 mg via ORAL

## 2014-02-06 MED ORDER — LACTATED RINGERS IV SOLN
INTRAVENOUS | Status: DC
  Administered 2014-02-06: 21:00:00 via INTRAVENOUS

## 2014-02-06 MED ORDER — HYDROMORPHONE HCL PF 1 MG/ML IJ SOLN
INTRAMUSCULAR | Status: AC
  Filled 2014-02-06: qty 1

## 2014-02-06 MED ORDER — MIDAZOLAM HCL 2 MG/2ML IJ SOLN
INTRAMUSCULAR | Status: AC
  Filled 2014-02-06: qty 2

## 2014-02-06 MED ORDER — LIDOCAINE HCL (CARDIAC) 20 MG/ML IV SOLN
INTRAVENOUS | Status: DC | PRN
  Administered 2014-02-06: 80 mg via INTRAVENOUS

## 2014-02-06 MED ORDER — ESCITALOPRAM OXALATE 20 MG PO TABS
20.0000 mg | ORAL_TABLET | Freq: Every day | ORAL | Status: DC
  Administered 2014-02-07: 20 mg via ORAL
  Filled 2014-02-06: qty 1

## 2014-02-06 MED ORDER — SUFENTANIL CITRATE 50 MCG/ML IV SOLN
INTRAVENOUS | Status: AC
  Filled 2014-02-06: qty 1

## 2014-02-06 MED ORDER — ACETAMINOPHEN 10 MG/ML IV SOLN
1000.0000 mg | Freq: Four times a day (QID) | INTRAVENOUS | Status: DC
  Administered 2014-02-06 – 2014-02-07 (×3): 1000 mg via INTRAVENOUS
  Filled 2014-02-06 (×4): qty 100

## 2014-02-06 MED ORDER — GLYCOPYRROLATE 0.2 MG/ML IJ SOLN
INTRAMUSCULAR | Status: DC | PRN
  Administered 2014-02-06: 0.6 mg via INTRAVENOUS

## 2014-02-06 MED ORDER — OXYCODONE HCL 5 MG PO TABS
ORAL_TABLET | ORAL | Status: AC
  Filled 2014-02-06: qty 1

## 2014-02-06 MED ORDER — ROCURONIUM BROMIDE 100 MG/10ML IV SOLN
INTRAVENOUS | Status: DC | PRN
  Administered 2014-02-06: 40 mg via INTRAVENOUS
  Administered 2014-02-06: 10 mg via INTRAVENOUS

## 2014-02-06 MED ORDER — SODIUM CHLORIDE 0.9 % IJ SOLN: 3.0000 mL | Freq: Two times a day (BID) | INTRAMUSCULAR | Status: DC

## 2014-02-06 MED ORDER — SUFENTANIL CITRATE 50 MCG/ML IV SOLN
INTRAVENOUS | Status: DC | PRN
  Administered 2014-02-06: 5 ug via INTRAVENOUS
  Administered 2014-02-06: 20 ug via INTRAVENOUS
  Administered 2014-02-06 (×2): 10 ug via INTRAVENOUS
  Administered 2014-02-06: 5 ug via INTRAVENOUS

## 2014-02-06 MED ORDER — THROMBIN 20000 UNITS EX SOLR
CUTANEOUS | Status: AC
  Filled 2014-02-06: qty 20000

## 2014-02-06 MED ORDER — BUPIVACAINE-EPINEPHRINE 0.25% -1:200000 IJ SOLN
INTRAMUSCULAR | Status: DC | PRN
  Administered 2014-02-06: 3 mL

## 2014-02-06 MED ORDER — LACTATED RINGERS IV SOLN
INTRAVENOUS | Status: DC | PRN
  Administered 2014-02-06 (×2): via INTRAVENOUS

## 2014-02-06 SURGICAL SUPPLY — 55 items
BLADE 10 SAFETY STRL DISP (BLADE) ×2 IMPLANT
BLADE SURG ROTATE 9660 (MISCELLANEOUS) IMPLANT
BUR EGG ELITE 4.0 (BURR) IMPLANT
BUR MATCHSTICK NEURO 3.0 LAGG (BURR) IMPLANT
CANISTER SUCTION 2500CC (MISCELLANEOUS) ×2 IMPLANT
CLOSURE STERI-STRIP 1/4X4 (GAUZE/BANDAGES/DRESSINGS) ×2 IMPLANT
CLSR STERI-STRIP ANTIMIC 1/2X4 (GAUZE/BANDAGES/DRESSINGS) ×2 IMPLANT
CORDS BIPOLAR (ELECTRODE) ×2 IMPLANT
COVER SURGICAL LIGHT HANDLE (MISCELLANEOUS) ×4 IMPLANT
CRADLE DONUT ADULT HEAD (MISCELLANEOUS) ×2 IMPLANT
DRAPE C-ARM 42X72 X-RAY (DRAPES) ×2 IMPLANT
DRAPE POUCH INSTRU U-SHP 10X18 (DRAPES) ×2 IMPLANT
DRAPE SURG 17X23 STRL (DRAPES) ×2 IMPLANT
DRAPE U-SHAPE 47X51 STRL (DRAPES) ×2 IMPLANT
DRSG MEPILEX BORDER 4X4 (GAUZE/BANDAGES/DRESSINGS) ×2 IMPLANT
DURAPREP 26ML APPLICATOR (WOUND CARE) ×2 IMPLANT
ELECT COATED BLADE 2.86 ST (ELECTRODE) ×2 IMPLANT
ELECT REM PT RETURN 9FT ADLT (ELECTROSURGICAL) ×2
ELECTRODE REM PT RTRN 9FT ADLT (ELECTROSURGICAL) ×1 IMPLANT
GLOVE BIOGEL PI IND STRL 8 (GLOVE) ×1 IMPLANT
GLOVE BIOGEL PI IND STRL 8.5 (GLOVE) ×1 IMPLANT
GLOVE BIOGEL PI INDICATOR 8 (GLOVE) ×1
GLOVE BIOGEL PI INDICATOR 8.5 (GLOVE) ×1
GLOVE ECLIPSE 8.5 STRL (GLOVE) ×2 IMPLANT
GLOVE ORTHO TXT STRL SZ7.5 (GLOVE) ×2 IMPLANT
GOWN STRL REUS W/ TWL XL LVL3 (GOWN DISPOSABLE) ×2 IMPLANT
GOWN STRL REUS W/TWL 2XL LVL3 (GOWN DISPOSABLE) ×4 IMPLANT
GOWN STRL REUS W/TWL XL LVL3 (GOWN DISPOSABLE) ×2
KIT BASIN OR (CUSTOM PROCEDURE TRAY) ×2 IMPLANT
KIT ROOM TURNOVER OR (KITS) ×2 IMPLANT
NEEDLE SPNL 18GX3.5 QUINCKE PK (NEEDLE) ×2 IMPLANT
NS IRRIG 1000ML POUR BTL (IV SOLUTION) ×2 IMPLANT
PACK ORTHO CERVICAL (CUSTOM PROCEDURE TRAY) ×2 IMPLANT
PACK UNIVERSAL I (CUSTOM PROCEDURE TRAY) ×2 IMPLANT
PAD ARMBOARD 7.5X6 YLW CONV (MISCELLANEOUS) ×4 IMPLANT
PATTIES SURGICAL .25X.25 (GAUZE/BANDAGES/DRESSINGS) ×2 IMPLANT
PEEK LORDOTIC LRG 7MM (Peek) ×4 IMPLANT
PUTTY BONE DBX 5CC MIX (Putty) ×2 IMPLANT
RESTRAINT LIMB HOLDER UNIV (RESTRAINTS) ×2 IMPLANT
SPONGE INTESTINAL PEANUT (DISPOSABLE) ×2 IMPLANT
SPONGE LAP 4X18 X RAY DECT (DISPOSABLE) ×4 IMPLANT
SPONGE SURGIFOAM ABS GEL 100 (HEMOSTASIS) ×2 IMPLANT
SURGIFLO TRUKIT (HEMOSTASIS) ×2 IMPLANT
SUT MON AB 3-0 SH 27 (SUTURE) ×1
SUT MON AB 3-0 SH27 (SUTURE) ×1 IMPLANT
SUT SILK 2 0 (SUTURE)
SUT SILK 2-0 18XBRD TIE 12 (SUTURE) IMPLANT
SUT VIC AB 2-0 CT1 18 (SUTURE) ×2 IMPLANT
SYR BULB IRRIGATION 50ML (SYRINGE) ×2 IMPLANT
SYR CONTROL 10ML LL (SYRINGE) ×2 IMPLANT
TAPE CLOTH 4X10 WHT NS (GAUZE/BANDAGES/DRESSINGS) ×2 IMPLANT
TAPE UMBILICAL COTTON 1/8X30 (MISCELLANEOUS) ×6 IMPLANT
TOWEL OR 17X24 6PK STRL BLUE (TOWEL DISPOSABLE) ×2 IMPLANT
TOWEL OR 17X26 10 PK STRL BLUE (TOWEL DISPOSABLE) ×2 IMPLANT
WATER STERILE IRR 1000ML POUR (IV SOLUTION) ×2 IMPLANT

## 2014-02-06 NOTE — Transfer of Care (Signed)
Immediate Anesthesia Transfer of Care Note  Patient: Pamela Sims  Procedure(s) Performed: Procedure(s): ACDF C4-C5, C6-C7 ANTERIOR CERVICAL DISCECTOMY FUSION WITH ILIAC CREST BONE HARVEST (N/A)  Patient Location: PACU  Anesthesia Type:General  Level of Consciousness: alert , oriented, sedated, patient cooperative and responds to stimulation  Airway & Oxygen Therapy: Patient Spontanous Breathing and Patient connected to nasal cannula oxygen  Post-op Assessment: Report given to PACU RN, Post -op Vital signs reviewed and stable, Patient moving all extremities and Patient moving all extremities X 4  Post vital signs: Reviewed and stable  Complications: No apparent anesthesia complications

## 2014-02-06 NOTE — H&P (Signed)
History of Present Illness  The patient is a 63 year old female who comes in today for a preoperative History and Physical. The patient is scheduled for a ACDF C4-7 with iliac crest bone harvest to be performed by Dr. Duane Lope D. Rolena Infante, MD at [facility] on 02-06-14 . Please see the hospital record for complete dictated history and physical.  Bently is a 63 year old white female who has a history of C4-5, C6-7 degenerative disk disease. Pain right greater than left upper extremity radicular symptoms. She states the symptoms are unchanged from her previous visit. She wants to proceed with C4-5 and C6-7 fusion as scheduled next week. She received preoperative ENT clearance.   Allergies Adhesive Bandages *MEDICAL DEVICES*   Family History Osteoarthritis. Mother. mother Osteoporosis. mother Chronic Obstructive Lung Disease. First Degree Relatives. mother Depression. First Degree Relatives. mother and brother Cancer. grandmother mothers side and child Bleeding disorder. grandfather fathers side Hypertension. mother, father, grandfather mothers side, grandmother fathers side and grandfather fathers side Drug / Alcohol Addiction. father and brother    Social History Pain Contract. no Number of flights of stairs before winded. 1 Marital status. divorced Alcohol use. Formerly drank alcohol. Tobacco use. Former smoker. former smoker; smoke(d) 1 1/2 pack(s) per day Tobacco / smoke exposure. yes outdoors only Drug/Alcohol Rehab (Currently). no Current work status. working full time Children. 2 Illicit drug use. no Exercise. Exercises weekly; does running / walking and other Drug/Alcohol Rehab (Previously). no    Medication History Abilify (2MG  Tablet, Oral) Active. Chlorthalidone (25MG  Tablet, Oral) Active. Escitalopram Oxalate (20MG  Tablet, 1 Oral daily) Active. Valsartan (320MG  Tablet, 1 Oral daily) Active. Krill Oil (1000MG  Capsule, 1  Oral daily) Active. Methylphenidate HCl ER (54MG  Tablet ER, Oral) Active. Multiple Vitamins/Womens (1 Oral) Active. Aleve (220MG  Capsule, 1 Oral) Active. Medications Reconciled.    Past Surgical History Spinal Fusion. neck Spinal Decompression. lower back Sinus Surgery Total Knee Replacement. left Tonsillectomy Straighten Nasal Septum Foot Surgery. left Colon Polyp Removal - Colonoscopy Breast Biopsy. left Neck Disc Surgery Hysterectomy. partial (non-cancerous) Gallbladder Surgery. laporoscopic    Other Problems High blood pressure Irritable bowel syndrome Migraine Headache Gastroesophageal Reflux Disease Anxiety Disorder Asthma Depression    Vitals 01/28/2014 9:51 AM Weight: 220 lb Height: 67 in Body Surface Area: 2.17 m Body Mass Index: 34.46 kg/m BP: 131/65 (Sitting, Right Arm, Standard)   Objective Transcription  She is a pleasant white female. She is alert and oriented times three. She is in no acute distress. Alert and oriented times three,in no acute distress. Gait is antalgic. No increase in respiratory effort. Head is normocephalic, atraumatic. PERRLA. EOMI O Lungs are CTA bilaterally. No wheezing is noted. Heart RRR. No murmurs. Abdomen is round and non-distended. NBS times four, soft and tender. Neurologically intact. Skin warm and dry.   Assessment & Plan  Risks of surgery include, but are not limited to: Throat pain,swallowing difficulty, hoarseness or change in voice, Death, stroke, paralysis, nerve root damage/injury, bleeding, blood clots, loss of bowel/bladder control, hardware failure, or malposition, spinal fluid leak, adjacent segment disease, non-union, need for further surgery, ongoing or worse pain, infection and recurrent disc herniation   Assessments Transcription  C4-5 and C6-7 degenerative disk disease/ stenosis. Neck pain and right greater than left upper extremity radiculopathy.  We will proceed  with C4-5 and C6-7 fusion with left iliac crest bone graft, scheduled next week. We discussed the surgery, potential recovery time. All questions were encouraged and answered.

## 2014-02-06 NOTE — Anesthesia Postprocedure Evaluation (Signed)
  Anesthesia Post-op Note  Patient: Pamela Sims  Procedure(s) Performed: Procedure(s): ACDF C4-C5, C6-C7 ANTERIOR CERVICAL DISCECTOMY FUSION WITH ILIAC CREST BONE HARVEST (N/A)  Patient Location: PACU  Anesthesia Type:General  Level of Consciousness: awake and alert   Airway and Oxygen Therapy: Patient Spontanous Breathing  Post-op Pain: mild  Post-op Assessment: Post-op Vital signs reviewed, Patient's Cardiovascular Status Stable and Respiratory Function Stable  Post-op Vital Signs: Reviewed  Filed Vitals:   02/06/14 1800  BP: 146/76  Pulse: 90  Temp:   Resp: 20    Complications: No apparent anesthesia complications

## 2014-02-06 NOTE — Anesthesia Preprocedure Evaluation (Addendum)
Anesthesia Evaluation  Patient identified by MRN, date of birth, ID band Patient awake    Reviewed: Allergy & Precautions, H&P , NPO status , Patient's Chart, lab work & pertinent test results  History of Anesthesia Complications (+) PROLONGED EMERGENCE and history of anesthetic complications  Airway Mallampati: II TM Distance: >3 FB Neck ROM: Full    Dental  (+) Teeth Intact, Caps, Dental Advisory Given   Pulmonary shortness of breath, asthma , former smoker,  Hx/o TB breath sounds clear to auscultation  Pulmonary exam normal       Cardiovascular Exercise Tolerance: Good hypertension, Pt. on medications Rhythm:Regular Rate:Normal     Neuro/Psych  Headaches, PSYCHIATRIC DISORDERS Anxiety Depression ADHD Neuromuscular disease    GI/Hepatic Neg liver ROS, hiatal hernia, GERD-  Controlled,  Endo/Other  Morbid obesityHyperlipidemia  Renal/GU negative Renal ROS  negative genitourinary   Musculoskeletal  (+) Arthritis -, Osteoarthritis,    Abdominal (+) + obese,   Peds  Hematology negative hematology ROS (+)   Anesthesia Other Findings   Reproductive/Obstetrics negative OB ROS                        Anesthesia Physical Anesthesia Plan  ASA: III  Anesthesia Plan: General   Post-op Pain Management:    Induction: Intravenous  Airway Management Planned: Oral ETT and Video Laryngoscope Planned  Additional Equipment:   Intra-op Plan:   Post-operative Plan: Extubation in OR  Informed Consent: I have reviewed the patients History and Physical, chart, labs and discussed the procedure including the risks, benefits and alternatives for the proposed anesthesia with the patient or authorized representative who has indicated his/her understanding and acceptance.   Dental advisory given  Plan Discussed with: CRNA, Anesthesiologist and Surgeon  Anesthesia Plan Comments:        Anesthesia  Quick Evaluation

## 2014-02-06 NOTE — Anesthesia Procedure Notes (Signed)
Procedure Name: Intubation Date/Time: 02/06/2014 2:34 PM Performed by: Jenne Campus Pre-anesthesia Checklist: Patient identified, Emergency Drugs available, Suction available, Patient being monitored and Timeout performed Patient Re-evaluated:Patient Re-evaluated prior to inductionOxygen Delivery Method: Circle system utilized Preoxygenation: Pre-oxygenation with 100% oxygen Intubation Type: IV induction Ventilation: Mask ventilation without difficulty and Oral airway inserted - appropriate to patient size Grade View: Grade I Tube type: Oral Tube size: 7.0 mm Number of attempts: 1 Airway Equipment and Method: Stylet and Video-laryngoscopy Placement Confirmation: ETT inserted through vocal cords under direct vision,  positive ETCO2,  CO2 detector and breath sounds checked- equal and bilateral Secured at: 21 cm Tube secured with: Tape Dental Injury: Teeth and Oropharynx as per pre-operative assessment  Comments: Elective video-glide due to patient's previous cervical fusion and current cervical fusion surgery. Smooth IV induction. EZ mask. DLx1. Grade 1 view. AOI. +ETCO2 =bbs.

## 2014-02-06 NOTE — Brief Op Note (Signed)
02/06/2014  5:34 PM  PATIENT:  Pamela Sims  63 y.o. female  PRE-OPERATIVE DIAGNOSIS:  ADJACENT SEGMENT DISEASE/CERVICAL STENOSIS  POST-OPERATIVE DIAGNOSIS:  ADJACENT SEGMENT DISEASE/CERVICAL STENOSIS  PROCEDURE:  Procedure(s): ACDF C4-C5, C6-C7 ANTERIOR CERVICAL DISCECTOMY FUSION WITH ILIAC CREST BONE HARVEST (N/A)  SURGEON:  Surgeon(s) and Role:    * Melina Schools, MD - Primary  PHYSICIAN ASSISTANT:   ASSISTANTS: Benjiman Core   ANESTHESIA:   general  EBL:  Total I/O In: 1500 [I.V.:1500] Out: 100 [Blood:100]  BLOOD ADMINISTERED:none  DRAINS: none   LOCAL MEDICATIONS USED:  MARCAINE     SPECIMEN:  No Specimen  DISPOSITION OF SPECIMEN:  N/A  COUNTS:  YES  TOURNIQUET:  * No tourniquets in log *  DICTATION: .Other Dictation: Dictation Number [id number]  PLAN OF CARE: Admit for overnight observation  PATIENT DISPOSITION:  PACU - hemodynamically stable.

## 2014-02-07 ENCOUNTER — Ambulatory Visit (HOSPITAL_COMMUNITY): Payer: Worker's Compensation

## 2014-02-07 ENCOUNTER — Encounter (HOSPITAL_COMMUNITY): Payer: Self-pay | Admitting: General Practice

## 2014-02-07 MED ORDER — POLYETHYLENE GLYCOL 3350 17 G PO PACK: 17.0000 g | PACK | Freq: Every day | ORAL | Status: DC

## 2014-02-07 MED ORDER — OXYCODONE-ACETAMINOPHEN 10-325 MG PO TABS: 1.0000 | ORAL_TABLET | Freq: Four times a day (QID) | ORAL | Status: DC | PRN

## 2014-02-07 MED ORDER — ONDANSETRON 4 MG PO TBDP: 4.0000 mg | ORAL_TABLET | Freq: Three times a day (TID) | ORAL | Status: DC | PRN

## 2014-02-07 MED ORDER — PNEUMOCOCCAL VAC POLYVALENT 25 MCG/0.5ML IJ INJ: 0.5000 mL | INJECTION | INTRAMUSCULAR | Status: DC

## 2014-02-07 MED ORDER — DOCUSATE SODIUM 100 MG PO CAPS: 100.0000 mg | ORAL_CAPSULE | Freq: Two times a day (BID) | ORAL | Status: DC

## 2014-02-07 MED ORDER — PNEUMOCOCCAL VAC POLYVALENT 25 MCG/0.5ML IJ INJ
0.5000 mL | INJECTION | Freq: Once | INTRAMUSCULAR | Status: DC
  Filled 2014-02-07: qty 0.5

## 2014-02-07 MED ORDER — IRBESARTAN 300 MG PO TABS
300.0000 mg | ORAL_TABLET | Freq: Every day | ORAL | Status: DC
  Administered 2014-02-07: 300 mg via ORAL
  Filled 2014-02-07: qty 1

## 2014-02-07 MED ORDER — METHOCARBAMOL 500 MG PO TABS: 500.0000 mg | ORAL_TABLET | Freq: Four times a day (QID) | ORAL | Status: DC | PRN

## 2014-02-07 MED FILL — Thrombin For Soln 20000 Unit: CUTANEOUS | Qty: 1 | Status: AC

## 2014-02-07 NOTE — Evaluation (Signed)
Occupational Therapy Evaluation and Discharge Patient Details Name: Pamela Sims MRN: [id number] DOB: [birth year] Today's Date: 02/07/2014    History of Present Illness ACDF C4-5 and C-67   Clinical Impression   This 63 yo female admitted and underwent above presents to acute OT with all education completed. Will D/C from acute OT.    Follow Up Recommendations  No OT follow up    Equipment Recommendations  None recommended by OT       Precautions / Restrictions Precautions Precautions: Cervical Precaution Comments: Handout given Required Braces or Orthoses: Cervical Brace Cervical Brace: Hard collar;At all times Restrictions Weight Bearing Restrictions: No      Mobility Bed Mobility Overal bed mobility: Modified Independent                Transfers Overall transfer level: Independent                         ADL Overall ADL's : Modified independent                                       General ADL Comments: I went over wear and care of cervical collar and had secretary order her a set of replacement pads. We went over oral hygiene, clothes that are better to get dressed in as far as UB, the way to get out of bed to better protect her neck, no Bending-Arching-Twisting, and showering instructions should be on her D/C orders.               Pertinent Vitals/Pain No c/o pain     Hand Dominance Right   Extremity/Trunk Assessment Upper Extremity Assessment Upper Extremity Assessment: Overall WFL for tasks assessed (pt reports numbness from carpal tunnel is gone post surgery)   Lower Extremity Assessment Lower Extremity Assessment: Overall WFL for tasks assessed       Communication Communication Communication: No difficulties   Cognition Arousal/Alertness: Awake/alert Behavior During Therapy: WFL for tasks assessed/performed Overall Cognitive Status: Within Functional Limits for tasks assessed                                 Home Living Family/patient expects to be discharged to:: Private residence Living Arrangements: Children Available Help at Discharge: Family (for the weekend) Type of Home: House Home Access: Stairs to enter [employer] of Steps: 1 Entrance Stairs-Rails: None Home Layout: Two level;Able to live on main level with bedroom/bathroom     Bathroom Shower/Tub: Tub/shower unit;Curtain Shower/tub characteristics: Architectural technologist: Standard     Home Equipment: Environmental consultant - 2 wheels;Shower seat;Bedside commode          Prior Functioning/Environment Level of Independence: Independent                      OT Goals(Current goals can be found in the care plan section) Acute Rehab OT Goals Patient Stated Goal: Home today  OT Frequency:                End of Session Equipment Utilized During Treatment: Cervical collar Nurse Communication:  (pt dressed)  Activity Tolerance: Patient tolerated treatment well Patient left:  (up and about in room)   Time: 4098-1191 OT Time Calculation (min): 24 min Charges:  OT General Charges $OT Visit: 1 Procedure OT Evaluation $  Initial OT Evaluation Tier I: 1 Procedure OT Treatments $Self Care/Home Management : 8-22 mins G-Codes: OT G-codes **NOT FOR INPATIENT CLASS** Functional Assessment Tool Used: Clinical observation Functional Limitation: Self care Self Care Current Status (M8413): At least 1 percent but less than 20 percent impaired, limited or restricted Self Care Goal Status (K4401): At least 1 percent but less than 20 percent impaired, limited or restricted Self Care Discharge Status [phone]): At least 1 percent but less than 20 percent impaired, limited or restricted  Almon Register [phone] 02/07/2014, 10:57 AM

## 2014-02-07 NOTE — Care Management Note (Signed)
CARE MANAGEMENT NOTE 02/07/2014  Patient:  Pamela Sims, Pamela Sims   Account Number:  [id number]  Date Initiated:  02/07/2014  Documentation initiated by:  Ricki Miller  Subjective/Objective Assessment:   [age] yr old female s/p ACDF C 4-5, C6-7     Action/Plan:   Patient seen and evaluated by PT/OT, no home health or DME needs identified at this time.   Anticipated DC Date:  02/07/2014   Anticipated DC Plan:  [place]  CM consult      PAC Choice  NA   Choice offered to / List presented to:             Status of service:  Completed, signed off Medicare Important Message given?   (If response is "NO", the following Medicare IM given date fields will be blank) Date Medicare IM given:   Date Additional Medicare IM given:    Discharge Disposition:  HOME/SELF CARE

## 2014-02-07 NOTE — Op Note (Signed)
Pamela Sims, Pamela Sims               ACCOUNT NO.:  [id number]  MEDICAL RECORD NO.:  [id number]  LOCATION:  [id number]                        FACILITY:  [facility]  PHYSICIAN:  Dahlia Bailiff, MD    DATE OF BIRTH:  June 21, 1951  DATE OF PROCEDURE:  02/06/2014 DATE OF DISCHARGE:                              OPERATIVE REPORT   PREOPERATIVE DIAGNOSIS:  Adjacent segment cervical spondylotic radiculopathy.  POSTOPERATIVE DIAGNOSIS:  Adjacent segment cervical spondylotic radiculopathy.  OPERATIVE PROCEDURE:  Anterior cervical diskectomy and fusion, C4-5 and C6-7.  COMPLICATIONS:  None.  CONDITION:  Stable.  INSTRUMENTATION SYSTEM USED:  Synthes PEEK zero-profile plate and PEEK cage.  FIRST ASSISTANT:  Benjiman Core, PA.  HISTORY:  This is a very pleasant 63 year old woman who 7 years ago had an ACDF at C5-6 and initially did well.  She started having increasing right radicular arm pain with loss in quality of life.  Injection therapy provided temporary relief, but her overall quality of life continued to suffer.  As a result, we elected to proceed with surgery. All appropriate risks, benefits, and alternatives were discussed with the patient and consent was obtained.  OPERATIVE NOTE:  The patient was brought to the operating room table, placed supine on the operating table.  After successful induction of general anesthesia and endotracheal intubation, TEDs, SCDs, and a Foley were applied.  Rolled towels were placed between the shoulder blades. The anterior cervical spine was prepped and draped in standard fashion. The patient had preop ENT evaluation, which indicated normal vocal cord function and initially she thought the surgery before was in the left, but after prepping and draping I actually found the incision was on the right-hand side, so I elected to proceed with a left longitudinal standard Smith-Robinson approach.  The incision site was infiltrated.  I made a longitudinal incision  along the sternocleidomastoid left side. Sharp dissection was carried out down to and through the platysma.  I bluntly dissected through the deep cervical and prevertebral fascia identifying the omohyoid.  Once I had the omohyoid identified, which allowed for much greater retraction.  I then swept the trachea and esophagus to the right and protected it with a thyroid retractor, identified the carotid artery, protected it with a finger.  I used Kittner dissectors to mobilize the remaining prevertebral fascia to completely expose the anterior longitudinal ligament in the anterior surface of the spine from C5-C7.  I identified the previous 5-6 fusion and confirmed with an x-ray.  I used a Hotel manager to remove the large exostosis at 4- 5 and 6-7.  I then mobilized the longus colli muscles from the midbody of 4 to the midbody of 5 and then midbody of 6 to the midbody of C7. Self-retaining retractors were placed underneath the longus colli muscles.  The endotracheal cuff was deflated, extended the retractor, reinflated the cup.  Distraction pins were placed into the body of C6 and C7, and I performed an annulotomy at the 6-7 disk space with a #15 blade scalpel.  Using a combination of pituitary rongeurs, curettes, and Kerrison rongeurs, I removed the bulk of the disk at this level.  There was actually a cyst within the midportion  of the C7 vertebral body, which I debrided with a curette until I had bleeding bone.  I then distracted the intervertebral space with a lamina spreader, and then maintained the distraction with the distraction pin set.  I continued my diskectomy posteriorly until I released the annulus posteriorly.  I then used a 1-mm Kerrison to get underneath the posterior aspect of the vertebral body and trimmed down the osteophyte.  At this point, I had an adequate diskectomy and decompression.  There was a small hard fragment on the right side consistent with  what was seen on MRI.  Once the disk fragment was removed and the bone spurs were removed, I rasped the endplates, trialed, and placed a 7 large zero-profile plate packed with DBX mix.  Once this was malleted to the appropriate depth, I then used my awl to create the holes for the screws and then placed 16-mm length screws.  Each screw was locked, advanced, and got excellent purchase. The locking mechanism had engaged both screws.  At this point, I removed the distraction pins from C7, placed bone wax into the space, and repositioned it into the body of C4.  I then repositioned the retractors, and now exposed 4-5 disk space.  Using the same technique that I just used at 6-7, I completed my diskectomy at 4-5.  Again, I released the posterior annulus in an adequate diskectomy, I had bleeding subchondral bone.  I placed the same size graft packed with DBX mix and locked it down with the same size screws.  The wound was copiously irrigated with normal saline.  I obtained hemostasis using bipolar electrocautery and Floseal.  The wound was irrigated copiously with normal saline.  I returned the trachea and esophagus to the midline, closed the platysma with interrupted 2-0 Vicryl sutures and a 3-0 Monocryl for the skin.  Steri-Strips and a dry dressing were applied.  An Aspen collar was then applied.  The patient was extubated, transferred to the PACU without incident.  At the end of the case, all needle and sponge counts were correct.  There were no adverse intraoperative events.  First assistant was Benjiman Core, my PA, instrumental in assisting with irrigation, suction, retraction, and wound closure.     Dahlia Bailiff, MD     DDB/MEDQ  D:  02/06/2014  T:  02/07/2014  Job:  [id number]

## 2014-02-07 NOTE — Plan of Care (Signed)
Problem: Consults Goal: Diagnosis - Spinal Surgery Cervical Spine Fusion     

## 2014-02-07 NOTE — Discharge Instructions (Signed)

## 2014-02-07 NOTE — Progress Notes (Signed)
    Subjective: Procedure(s) (LRB): ACDF C4-C5, C6-C7 ANTERIOR CERVICAL DISCECTOMY FUSION WITH ILIAC CREST BONE HARVEST (N/A) 1 Day Post-Op  Patient reports pain as 0 on 0-10 scale.  Reports decreased arm pain denies incisional neck pain   Positive void Positive bowel movement Positive flatus Negative chest pain or shortness of breath  Objective: Vital signs in last 24 hours: Temp:  [97.7 F (36.5 C)-98.4 F (36.9 C)] 97.7 F (36.5 C) (05/15 0636) Pulse Rate:  [65-92] 82 (05/15 0636) Resp:  [12-20] 18 (05/15 0636) BP: (135-148)/(43-90) 135/82 mmHg (05/15 0636) SpO2:  [92 %-99 %] 99 % (05/15 0636) Weight:  [234 lb 6 oz (106.312 kg)] 234 lb 6 oz (106.312 kg) (05/14 1124)  Intake/Output from previous day: 05/14 0701 - 05/15 0700 In: 1750 [I.V.:1750] Out: 100 [Blood:100]  Labs: No results found for this basename: WBC, RBC, HCT, PLT,  in the last 72 hours No results found for this basename: NA, K, CL, CO2, BUN, CREATININE, GLUCOSE, CALCIUM,  in the last 72 hours No results found for this basename: LABPT, INR,  in the last 72 hours  Physical Exam: Neurologically intact ABD soft Neurovascular intact Intact pulses distally Incision: dressing C/D/I Compartment soft  Assessment/Plan: Patient stable  xrays pending Mobilization with physical therapy Encourage incentive spirometry Continue care  Advance diet Up with therapy D/C IV fluids Doing very well Ok for d/c after working with PT this AM  Melina Schools, [place] [phone]

## 2014-02-07 NOTE — Progress Notes (Signed)
PT Cancellation and Discharge Note  Patient Details Name: Pamela Sims MRN: [id number] DOB: 1951-03-27   Cancelled Treatment:    Reason Eval/Treat Not Completed: PT screened, no needs identified, will sign off.  Pt screened by OT and no PT needs identified.  Will sign off.   [place], PT [phone] 02/07/2014, 10:51 AM

## 2014-02-07 NOTE — Progress Notes (Signed)
Patient d/c to home, IV removed, prescriptions given, instructions reviewed. 

## 2014-02-14 ENCOUNTER — Emergency Department (HOSPITAL_COMMUNITY): Payer: Worker's Compensation

## 2014-02-14 ENCOUNTER — Emergency Department (HOSPITAL_COMMUNITY)
Admission: EM | Admit: 2014-02-14 | Discharge: 2014-02-14 | Disposition: A | Discharge status: Home / Self Care | Payer: Worker's Compensation | Attending: Emergency Medicine | Admitting: Emergency Medicine

## 2014-02-14 ENCOUNTER — Encounter (HOSPITAL_COMMUNITY): Payer: Self-pay | Admitting: Emergency Medicine

## 2014-02-14 DIAGNOSIS — F909 Attention-deficit hyperactivity disorder, unspecified type: Secondary | ICD-10-CM | POA: Insufficient documentation

## 2014-02-14 DIAGNOSIS — Z8739 Personal history of other diseases of the musculoskeletal system and connective tissue: Secondary | ICD-10-CM | POA: Insufficient documentation

## 2014-02-14 DIAGNOSIS — M25519 Pain in unspecified shoulder: Secondary | ICD-10-CM | POA: Insufficient documentation

## 2014-02-14 DIAGNOSIS — Z87891 Personal history of nicotine dependence: Secondary | ICD-10-CM | POA: Insufficient documentation

## 2014-02-14 DIAGNOSIS — M549 Dorsalgia, unspecified: Secondary | ICD-10-CM | POA: Insufficient documentation

## 2014-02-14 DIAGNOSIS — F329 Major depressive disorder, single episode, unspecified: Secondary | ICD-10-CM | POA: Insufficient documentation

## 2014-02-14 DIAGNOSIS — F3289 Other specified depressive episodes: Secondary | ICD-10-CM | POA: Insufficient documentation

## 2014-02-14 DIAGNOSIS — Z8611 Personal history of tuberculosis: Secondary | ICD-10-CM | POA: Insufficient documentation

## 2014-02-14 DIAGNOSIS — Z9889 Other specified postprocedural states: Secondary | ICD-10-CM | POA: Insufficient documentation

## 2014-02-14 DIAGNOSIS — I10 Essential (primary) hypertension: Secondary | ICD-10-CM | POA: Insufficient documentation

## 2014-02-14 DIAGNOSIS — F411 Generalized anxiety disorder: Secondary | ICD-10-CM | POA: Insufficient documentation

## 2014-02-14 DIAGNOSIS — M4802 Spinal stenosis, cervical region: Secondary | ICD-10-CM | POA: Insufficient documentation

## 2014-02-14 DIAGNOSIS — J45909 Unspecified asthma, uncomplicated: Secondary | ICD-10-CM | POA: Insufficient documentation

## 2014-02-14 DIAGNOSIS — Z8719 Personal history of other diseases of the digestive system: Secondary | ICD-10-CM | POA: Insufficient documentation

## 2014-02-14 DIAGNOSIS — Z79899 Other long term (current) drug therapy: Secondary | ICD-10-CM | POA: Insufficient documentation

## 2014-02-14 MED ORDER — PREDNISONE 20 MG PO TABS: ORAL_TABLET | ORAL | Status: AC

## 2014-02-14 MED ORDER — METHYLPREDNISOLONE SODIUM SUCC 125 MG IJ SOLR
125.0000 mg | Freq: Once | INTRAMUSCULAR | Status: AC
  Administered 2014-02-14: 125 mg via INTRAMUSCULAR
  Filled 2014-02-14: qty 2

## 2014-02-14 NOTE — ED Notes (Signed)
Discharge instructions reviewed with pt. Pt verbalized  Understanding.
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
## 2014-02-14 NOTE — ED Notes (Signed)
Pt returned from MRI °

## 2014-02-14 NOTE — ED Notes (Signed)
Pt refusing pain medication. States painful only with movement.

## 2014-02-14 NOTE — ED Notes (Signed)
Pt states she had a c4 fusion last Thursday and was doing well.  Starting last night she noticed some tightness in her upper back and weakness in her right arm.  Pt tried applying ice to her back without relief.

## 2014-02-14 NOTE — ED Notes (Signed)
Pt offered percocet for pain, but refused at this time.  Pt states the percocet's only make her stay awake and don't help with the pain

## 2014-03-21 NOTE — OR Nursing (Signed)
Addendum to scope page 

## 2014-04-13 ENCOUNTER — Ambulatory Visit (INDEPENDENT_AMBULATORY_CARE_PROVIDER_SITE_OTHER): Payer: BC Managed Care – PPO | Admitting: Family Medicine

## 2014-04-13 VITALS — BP 134/76 | HR 76 | Temp 98.6°F | Resp 16 | Ht 66.0 in | Wt 238.2 lb

## 2014-04-13 DIAGNOSIS — W57XXXA Bitten or stung by nonvenomous insect and other nonvenomous arthropods, initial encounter: Secondary | ICD-10-CM

## 2014-04-13 DIAGNOSIS — L03319 Cellulitis of trunk, unspecified: Secondary | ICD-10-CM

## 2014-04-13 DIAGNOSIS — L02219 Cutaneous abscess of trunk, unspecified: Secondary | ICD-10-CM

## 2014-04-13 DIAGNOSIS — T148 Other injury of unspecified body region: Secondary | ICD-10-CM

## 2014-04-13 DIAGNOSIS — R51 Headache: Secondary | ICD-10-CM

## 2014-04-13 DIAGNOSIS — L03311 Cellulitis of abdominal wall: Secondary | ICD-10-CM

## 2014-04-13 MED ORDER — DOXYCYCLINE HYCLATE 100 MG PO TABS: 100.0000 mg | ORAL_TABLET | Freq: Two times a day (BID) | ORAL | Status: DC
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
## 2014-04-13 NOTE — Patient Instructions (Signed)
Tick Bite Information Ticks are insects that attach themselves to the skin and draw blood for food. There are various types of ticks. Common types include wood ticks and deer ticks. Most ticks live in shrubs and grassy areas. Ticks can climb onto your body when you make contact with leaves or grass where the tick is waiting. The most common places on the body for ticks to attach themselves are the scalp, neck, armpits, waist, and groin. Most tick bites are harmless, but sometimes ticks carry germs that cause diseases. These germs can be spread to a person during the tick's feeding process. The chance of a disease spreading through a tick bite depends on:   The type of tick.  Time of year.   How long the tick is attached.   Geographic location.  HOW CAN YOU PREVENT TICK BITES? Take these steps to help prevent tick bites when you are outdoors:  Wear protective clothing. Long sleeves and long pants are best.   Wear white clothes so you can see ticks more easily.  Tuck your pant legs into your socks.   If walking on a trail, stay in the middle of the trail to avoid brushing against bushes.  Avoid walking through areas with long grass.  Put insect repellent on all exposed skin and along boot tops, pant legs, and sleeve cuffs.   Check clothing, hair, and skin repeatedly and before going inside.   Brush off any ticks that are not attached.  Take a shower or bath as soon as possible after being outdoors.  WHAT IS THE PROPER WAY TO REMOVE A TICK? Ticks should be removed as soon as possible to help prevent diseases caused by tick bites. 1. If latex gloves are available, put them on before trying to remove a tick.  2. Using fine-point tweezers, grasp the tick as close to the skin as possible. You may also use curved forceps or a tick removal tool. Grasp the tick as close to its head as possible. Avoid grasping the tick on its body. 3. Pull gently with steady upward pressure until  the tick lets go. Do not twist the tick or jerk it suddenly. This may break off the tick's head or mouth parts. 4. Do not squeeze or crush the tick's body. This could force disease-carrying fluids from the tick into your body.  5. After the tick is removed, wash the bite area and your hands with soap and water or other disinfectant such as alcohol. 6. Apply a small amount of antiseptic cream or ointment to the bite site.  7. Wash and disinfect any instruments that were used.  Do not try to remove a tick by applying a hot match, petroleum jelly, or fingernail polish to the tick. These methods do not work and may increase the chances of disease being spread from the tick bite.  WHEN SHOULD YOU SEEK MEDICAL CARE? Contact your health care provider if you are unable to remove a tick from your skin or if a part of the tick breaks off and is stuck in the skin.  After a tick bite, you need to be aware of signs and symptoms that could be related to diseases spread by ticks. Contact your health care provider if you develop any of the following in the days or weeks after the tick bite:  Unexplained fever.  Rash. A circular rash that appears days or weeks after the tick bite may indicate the possibility of Lyme disease. The rash may resemble   a target with a bull's-eye and may occur at a different part of your body than the tick bite.  Redness and swelling in the area of the tick bite.   Tender, swollen lymph glands.   Diarrhea.   Weight loss.   Cough.   Fatigue.   Muscle, joint, or bone pain.   Abdominal pain.   Headache.   Lethargy or a change in your level of consciousness.  Difficulty walking or moving your legs.   Numbness in the legs.   Paralysis.  Shortness of breath.   Confusion.   Repeated vomiting.  Document Released: 09/09/2000 Document Revised: 07/03/2013 Document Reviewed: 02/20/2013 [employer] Patient Information 2015 [employer]. This information is  not intended to replace advice given to you by your health care provider. Make sure you discuss any questions you have with your health care provider.  

## 2014-04-14 LAB — B. BURGDORFI ANTIBODIES: B burgdorferi Ab IgG+IgM: 0.08 {ISR}

## 2014-04-14 LAB — ROCKY MTN SPOTTED FVR AB, IGM-BLOOD: ROCKY MTN SPOTTED FEVER, IGM: 0.13 IV

## 2014-08-20 ENCOUNTER — Emergency Department (HOSPITAL_COMMUNITY): Payer: Worker's Compensation

## 2014-08-20 ENCOUNTER — Encounter (HOSPITAL_COMMUNITY): Payer: Self-pay | Admitting: Emergency Medicine

## 2014-08-20 ENCOUNTER — Emergency Department (HOSPITAL_COMMUNITY)
Admission: EM | Admit: 2014-08-20 | Discharge: 2014-08-20 | Disposition: A | Discharge status: Home / Self Care | Payer: Worker's Compensation | Attending: Emergency Medicine | Admitting: Emergency Medicine

## 2014-08-20 DIAGNOSIS — Y9389 Activity, other specified: Secondary | ICD-10-CM | POA: Diagnosis not present

## 2014-08-20 DIAGNOSIS — Z79899 Other long term (current) drug therapy: Secondary | ICD-10-CM | POA: Diagnosis not present

## 2014-08-20 DIAGNOSIS — S199XXA Unspecified injury of neck, initial encounter: Secondary | ICD-10-CM | POA: Insufficient documentation

## 2014-08-20 DIAGNOSIS — S99922A Unspecified injury of left foot, initial encounter: Secondary | ICD-10-CM | POA: Diagnosis not present

## 2014-08-20 DIAGNOSIS — S7002XA Contusion of left hip, initial encounter: Secondary | ICD-10-CM | POA: Insufficient documentation

## 2014-08-20 DIAGNOSIS — I10 Essential (primary) hypertension: Secondary | ICD-10-CM | POA: Insufficient documentation

## 2014-08-20 DIAGNOSIS — W109XXA Fall (on) (from) unspecified stairs and steps, initial encounter: Secondary | ICD-10-CM | POA: Diagnosis not present

## 2014-08-20 DIAGNOSIS — Z87891 Personal history of nicotine dependence: Secondary | ICD-10-CM | POA: Insufficient documentation

## 2014-08-20 DIAGNOSIS — F909 Attention-deficit hyperactivity disorder, unspecified type: Secondary | ICD-10-CM | POA: Insufficient documentation

## 2014-08-20 DIAGNOSIS — W19XXXA Unspecified fall, initial encounter: Secondary | ICD-10-CM

## 2014-08-20 DIAGNOSIS — M25562 Pain in left knee: Secondary | ICD-10-CM

## 2014-08-20 DIAGNOSIS — M542 Cervicalgia: Secondary | ICD-10-CM

## 2014-08-20 DIAGNOSIS — S8991XA Unspecified injury of right lower leg, initial encounter: Secondary | ICD-10-CM | POA: Insufficient documentation

## 2014-08-20 DIAGNOSIS — Z8611 Personal history of tuberculosis: Secondary | ICD-10-CM | POA: Diagnosis not present

## 2014-08-20 DIAGNOSIS — M25552 Pain in left hip: Secondary | ICD-10-CM

## 2014-08-20 DIAGNOSIS — Y9289 Other specified places as the place of occurrence of the external cause: Secondary | ICD-10-CM | POA: Diagnosis not present

## 2014-08-20 DIAGNOSIS — M25561 Pain in right knee: Secondary | ICD-10-CM

## 2014-08-20 DIAGNOSIS — Y998 Other external cause status: Secondary | ICD-10-CM | POA: Insufficient documentation

## 2014-08-20 DIAGNOSIS — S8992XA Unspecified injury of left lower leg, initial encounter: Secondary | ICD-10-CM | POA: Diagnosis not present

## 2014-08-20 DIAGNOSIS — Z8719 Personal history of other diseases of the digestive system: Secondary | ICD-10-CM | POA: Insufficient documentation

## 2014-08-20 DIAGNOSIS — S4991XA Unspecified injury of right shoulder and upper arm, initial encounter: Secondary | ICD-10-CM | POA: Diagnosis not present

## 2014-08-20 DIAGNOSIS — Z8739 Personal history of other diseases of the musculoskeletal system and connective tissue: Secondary | ICD-10-CM | POA: Insufficient documentation

## 2014-08-20 DIAGNOSIS — J45909 Unspecified asthma, uncomplicated: Secondary | ICD-10-CM | POA: Insufficient documentation

## 2014-08-20 DIAGNOSIS — Z9889 Other specified postprocedural states: Secondary | ICD-10-CM | POA: Insufficient documentation

## 2014-08-20 DIAGNOSIS — F419 Anxiety disorder, unspecified: Secondary | ICD-10-CM | POA: Diagnosis not present

## 2014-08-20 DIAGNOSIS — S4992XA Unspecified injury of left shoulder and upper arm, initial encounter: Secondary | ICD-10-CM | POA: Diagnosis not present

## 2014-08-20 MED ORDER — HYDROCODONE-ACETAMINOPHEN 5-325 MG PO TABS
2.0000 | ORAL_TABLET | Freq: Once | ORAL | Status: AC
  Administered 2014-08-20: 2 via ORAL
  Filled 2014-08-20: qty 2

## 2014-08-20 NOTE — ED Notes (Signed)
Pt states that she fell down 8 steps.  She states that she has hx of knee replacement and her knee gave out going down stairs.  Pt states that her hip is swollen.  Lt knee, lt foot, lt hip, rt knee, across shoulders and neck pain.  States that she had cervical fusion done earlier this year and is concerned about her fusion.  States that she called [facility] and they told her to come here.
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
## 2014-09-15 ENCOUNTER — Emergency Department (HOSPITAL_BASED_OUTPATIENT_CLINIC_OR_DEPARTMENT_OTHER): Payer: Worker's Compensation

## 2014-09-15 ENCOUNTER — Encounter (HOSPITAL_BASED_OUTPATIENT_CLINIC_OR_DEPARTMENT_OTHER): Payer: Self-pay | Admitting: *Deleted

## 2014-09-15 ENCOUNTER — Emergency Department (HOSPITAL_BASED_OUTPATIENT_CLINIC_OR_DEPARTMENT_OTHER)
Admission: EM | Admit: 2014-09-15 | Discharge: 2014-09-16 | Disposition: A | Discharge status: Home / Self Care | Payer: Worker's Compensation | Attending: Emergency Medicine | Admitting: Emergency Medicine

## 2014-09-15 DIAGNOSIS — M1990 Unspecified osteoarthritis, unspecified site: Secondary | ICD-10-CM | POA: Diagnosis not present

## 2014-09-15 DIAGNOSIS — Z7982 Long term (current) use of aspirin: Secondary | ICD-10-CM | POA: Insufficient documentation

## 2014-09-15 DIAGNOSIS — Z79899 Other long term (current) drug therapy: Secondary | ICD-10-CM | POA: Diagnosis not present

## 2014-09-15 DIAGNOSIS — F419 Anxiety disorder, unspecified: Secondary | ICD-10-CM | POA: Diagnosis not present

## 2014-09-15 DIAGNOSIS — R05 Cough: Secondary | ICD-10-CM | POA: Diagnosis present

## 2014-09-15 DIAGNOSIS — Z792 Long term (current) use of antibiotics: Secondary | ICD-10-CM | POA: Insufficient documentation

## 2014-09-15 DIAGNOSIS — J45901 Unspecified asthma with (acute) exacerbation: Secondary | ICD-10-CM | POA: Diagnosis not present

## 2014-09-15 DIAGNOSIS — F329 Major depressive disorder, single episode, unspecified: Secondary | ICD-10-CM | POA: Insufficient documentation

## 2014-09-15 DIAGNOSIS — F909 Attention-deficit hyperactivity disorder, unspecified type: Secondary | ICD-10-CM | POA: Insufficient documentation

## 2014-09-15 DIAGNOSIS — R059 Cough, unspecified: Secondary | ICD-10-CM

## 2014-09-15 DIAGNOSIS — Z87891 Personal history of nicotine dependence: Secondary | ICD-10-CM | POA: Diagnosis not present

## 2014-09-15 DIAGNOSIS — Z8719 Personal history of other diseases of the digestive system: Secondary | ICD-10-CM | POA: Diagnosis not present

## 2014-09-15 DIAGNOSIS — I10 Essential (primary) hypertension: Secondary | ICD-10-CM | POA: Diagnosis not present

## 2014-09-15 DIAGNOSIS — Z8611 Personal history of tuberculosis: Secondary | ICD-10-CM | POA: Insufficient documentation

## 2014-09-15 DIAGNOSIS — J40 Bronchitis, not specified as acute or chronic: Secondary | ICD-10-CM

## 2014-09-15 MED ORDER — HYDROCOD POLST-CHLORPHEN POLST 10-8 MG/5ML PO LQCR: 5.0000 mL | Freq: Two times a day (BID) | ORAL | Status: DC | PRN

## 2014-09-15 MED ORDER — ALBUTEROL SULFATE HFA 108 (90 BASE) MCG/ACT IN AERS: 1.0000 | INHALATION_SPRAY | Freq: Four times a day (QID) | RESPIRATORY_TRACT | Status: DC | PRN

## 2014-09-15 MED ORDER — ALBUTEROL SULFATE HFA 108 (90 BASE) MCG/ACT IN AERS
2.0000 | INHALATION_SPRAY | Freq: Once | RESPIRATORY_TRACT | Status: AC
  Administered 2014-09-15: 2 via RESPIRATORY_TRACT
  Filled 2014-09-15: qty 6.7

## 2014-09-15 MED ORDER — HYDROCOD POLST-CHLORPHEN POLST 10-8 MG/5ML PO LQCR: 5.0000 mL | Freq: Every evening | ORAL | Status: DC | PRN

## 2014-09-15 MED ORDER — AEROCHAMBER PLUS FLO-VU MEDIUM MISC
1.0000 | Freq: Once | Status: AC
  Filled 2014-09-15: qty 1

## 2014-09-15 NOTE — Discharge Instructions (Signed)
Call for a follow up appointment with a Family or Primary Care Provider.  Return if Symptoms worsen.   Take medication as prescribed.  Stop taking amoxicillin for your viral illness. Drink plenty of fluids.

## 2014-09-15 NOTE — ED Notes (Signed)
Cough for 9 days. Took left over Amoxicillin for a few days.
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
## 2014-09-15 NOTE — Patient Instructions (Signed)
Instructed patient on the proper use of administering albuterol mdi via aerochamber patient tolerated well 

## 2014-09-16 NOTE — ED Notes (Signed)
At time of discharge pt. Said she was not leaving because she still had a cough.  Explained to her that the cough would be there till she was able to take the meds ordered for her and the inhaler will work for her.  Pt. sats are 99% and pt. Having no difficulty speaking and cursing about being in ER for she said 4 hours and about leaving with a cough.  Pt. Demanded I hand her her shoes in the corner of the room and her coat so she could get the "hell" out of the room.  Pt. Pulled the oxygen sensor off her finger and Threw the oxygen sensor at [employer] without hitting me.  Pt. Stormed out of the room with out signing her discharge instructions.  Pt. Did take her scripts and her discharge paper work.

## 2014-09-22 ENCOUNTER — Ambulatory Visit (INDEPENDENT_AMBULATORY_CARE_PROVIDER_SITE_OTHER): Payer: BC Managed Care – PPO | Admitting: Emergency Medicine

## 2014-09-22 VITALS — BP 134/78 | HR 91 | Temp 98.0°F | Resp 16 | Ht 67.0 in | Wt 232.0 lb

## 2014-09-22 DIAGNOSIS — J209 Acute bronchitis, unspecified: Secondary | ICD-10-CM

## 2014-09-22 DIAGNOSIS — R300 Dysuria: Secondary | ICD-10-CM

## 2014-09-22 DIAGNOSIS — N3090 Cystitis, unspecified without hematuria: Secondary | ICD-10-CM

## 2014-09-22 LAB — POCT URINALYSIS DIPSTICK
Glucose, UA: NEGATIVE
Ketones, UA: 40
Nitrite, UA: POSITIVE
Protein, UA: 100
Spec Grav, UA: 1.02
Urobilinogen, UA: 2
pH, UA: 7

## 2014-09-22 LAB — POCT UA - MICROSCOPIC ONLY
Casts, Ur, LPF, POC: NEGATIVE
Crystals, Ur, HPF, POC: NEGATIVE
Mucus, UA: POSITIVE
Yeast, UA: NEGATIVE

## 2014-09-22 MED ORDER — HYDROCOD POLST-CHLORPHEN POLST 10-8 MG/5ML PO LQCR: 5.0000 mL | Freq: Two times a day (BID) | ORAL | Status: DC | PRN

## 2014-09-22 MED ORDER — LEVOFLOXACIN 500 MG PO TABS: 500.0000 mg | ORAL_TABLET | Freq: Every day | ORAL | Status: AC

## 2014-09-22 MED ORDER — AZITHROMYCIN 250 MG PO TABS: ORAL_TABLET | ORAL | Status: DC
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
## 2014-09-22 NOTE — Patient Instructions (Signed)
bAcute Bronchitis Bronchitis is inflammation of the airways that extend from the windpipe into the lungs (bronchi). The inflammation often causes mucus to develop. This leads to a cough, which is the most common symptom of bronchitis.  In acute bronchitis, the condition usually develops suddenly and goes away over time, usually in a couple weeks. Smoking, allergies, and asthma can make bronchitis worse. Repeated episodes of bronchitis may cause further lung problems.  CAUSES Acute bronchitis is most often caused by the same virus that causes a cold. The virus can spread from person to person (contagious) through coughing, sneezing, and touching contaminated objects. SIGNS AND SYMPTOMS   Cough.   Fever.   Coughing up mucus.   Body aches.   Chest congestion.   Chills.   Shortness of breath.   Sore throat.  DIAGNOSIS  Acute bronchitis is usually diagnosed through a physical exam. Your health care provider will also ask you questions about your medical history. Tests, such as chest X-rays, are sometimes done to rule out other conditions.  TREATMENT  Acute bronchitis usually goes away in a couple weeks. Oftentimes, no medical treatment is necessary. Medicines are sometimes given for relief of fever or cough. Antibiotic medicines are usually not needed but may be prescribed in certain situations. In some cases, an inhaler may be recommended to help reduce shortness of breath and control the cough. A cool mist vaporizer may also be used to help thin bronchial secretions and make it easier to clear the chest.  HOME CARE INSTRUCTIONS  Get plenty of rest.   Drink enough fluids to keep your urine clear or pale yellow (unless you have a medical condition that requires fluid restriction). Increasing fluids may help thin your respiratory secretions (sputum) and reduce chest congestion, and it will prevent dehydration.   Take medicines only as directed by your health care provider.  If  you were prescribed an antibiotic medicine, finish it all even if you start to feel better.  Avoid smoking and secondhand smoke. Exposure to cigarette smoke or irritating chemicals will make bronchitis worse. If you are a smoker, consider using nicotine gum or skin patches to help control withdrawal symptoms. Quitting smoking will help your lungs heal faster.   Reduce the chances of another bout of acute bronchitis by washing your hands frequently, avoiding people with cold symptoms, and trying not to touch your hands to your mouth, nose, or eyes.   Keep all follow-up visits as directed by your health care provider.  SEEK MEDICAL CARE IF: Your symptoms do not improve after 1 week of treatment.  SEEK IMMEDIATE MEDICAL CARE IF:  You develop an increased fever or chills.   You have chest pain.   You have severe shortness of breath.  You have bloody sputum.   You develop dehydration.  You faint or repeatedly feel like you are going to pass out.  You develop repeated vomiting.  You develop a severe headache. MAKE SURE YOU:   Understand these instructions.  Will watch your condition.  Will get help right away if you are not doing well or get worse. Document Released: 10/20/2004 Document Revised: 01/27/2014 Document Reviewed: 03/05/2013 [facility] Patient Information 2015 [place], [place]. This information is not intended to replace advice given to you by your health care provider. Make sure you discuss any questions you have with your health care provider. Urinary Tract Infection Urinary tract infections (UTIs) can develop anywhere along your urinary tract. Your urinary tract is your body's drainage system for removing  wastes and extra water. Your urinary tract includes two kidneys, two ureters, a bladder, and a urethra. Your kidneys are a pair of bean-shaped organs. Each kidney is about the size of your fist. They are located below your ribs, one on each side of your  spine. CAUSES Infections are caused by microbes, which are microscopic organisms, including fungi, viruses, and bacteria. These organisms are so small that they can only be seen through a microscope. Bacteria are the microbes that most commonly cause UTIs. SYMPTOMS  Symptoms of UTIs may vary by age and gender of the patient and by the location of the infection. Symptoms in young women typically include a frequent and intense urge to urinate and a painful, burning feeling in the bladder or urethra during urination. Older women and men are more likely to be tired, shaky, and weak and have muscle aches and abdominal pain. A fever may mean the infection is in your kidneys. Other symptoms of a kidney infection include pain in your back or sides below the ribs, nausea, and vomiting. DIAGNOSIS To diagnose a UTI, your caregiver will ask you about your symptoms. Your caregiver also will ask to provide a urine sample. The urine sample will be tested for bacteria and white blood cells. White blood cells are made by your body to help fight infection. TREATMENT  Typically, UTIs can be treated with medication. Because most UTIs are caused by a bacterial infection, they usually can be treated with the use of antibiotics. The choice of antibiotic and length of treatment depend on your symptoms and the type of bacteria causing your infection. HOME CARE INSTRUCTIONS  If you were prescribed antibiotics, take them exactly as your caregiver instructs you. Finish the medication even if you feel better after you have only taken some of the medication.  Drink enough water and fluids to keep your urine clear or pale yellow.  Avoid caffeine, tea, and carbonated beverages. They tend to irritate your bladder.  Empty your bladder often. Avoid holding urine for long periods of time.  Empty your bladder before and after sexual intercourse.  After a bowel movement, women should cleanse from front to back. Use each tissue only  once. SEEK MEDICAL CARE IF:   You have back pain.  You develop a fever.  Your symptoms do not begin to resolve within 3 days. SEEK IMMEDIATE MEDICAL CARE IF:   You have severe back pain or lower abdominal pain.  You develop chills.  You have nausea or vomiting.  You have continued burning or discomfort with urination. MAKE SURE YOU:   Understand these instructions.  Will watch your condition.  Will get help right away if you are not doing well or get worse. Document Released: 06/22/2005 Document Revised: 03/13/2012 Document Reviewed: 10/21/2011 [facility] Patient Information 2015 [place], [place]. This information is not intended to replace advice given to you by your health care provider. Make sure you discuss any questions you have with your health care provider.

## 2014-11-12 ENCOUNTER — Other Ambulatory Visit: Payer: Self-pay

## 2014-11-12 DIAGNOSIS — Z1231 Encounter for screening mammogram for malignant neoplasm of breast: Secondary | ICD-10-CM

## 2014-11-24 ENCOUNTER — Ambulatory Visit
Admission: RE | Admit: 2014-11-24 | Discharge: 2014-11-24 | Disposition: A | Discharge status: Home / Self Care | Payer: BC Managed Care – PPO | Source: Ambulatory Visit

## 2014-11-24 DIAGNOSIS — Z1231 Encounter for screening mammogram for malignant neoplasm of breast: Secondary | ICD-10-CM

## 2015-01-02 ENCOUNTER — Encounter: Payer: Self-pay | Admitting: *Deleted

## 2015-01-14 ENCOUNTER — Encounter: Payer: Self-pay | Admitting: Internal Medicine

## 2015-03-06 ENCOUNTER — Other Ambulatory Visit: Payer: Self-pay | Admitting: Family Medicine

## 2015-03-25 ENCOUNTER — Ambulatory Visit (INDEPENDENT_AMBULATORY_CARE_PROVIDER_SITE_OTHER): Payer: BC Managed Care – PPO

## 2015-03-25 ENCOUNTER — Ambulatory Visit (INDEPENDENT_AMBULATORY_CARE_PROVIDER_SITE_OTHER): Payer: BC Managed Care – PPO | Admitting: Emergency Medicine

## 2015-03-25 VITALS — BP 138/88 | HR 84 | Temp 98.6°F | Resp 20 | Ht 66.0 in | Wt 253.8 lb

## 2015-03-25 DIAGNOSIS — Z111 Encounter for screening for respiratory tuberculosis: Secondary | ICD-10-CM

## 2015-03-25 DIAGNOSIS — M7661 Achilles tendinitis, right leg: Secondary | ICD-10-CM

## 2015-03-25 DIAGNOSIS — M25579 Pain in unspecified ankle and joints of unspecified foot: Secondary | ICD-10-CM | POA: Diagnosis not present

## 2015-03-25 DIAGNOSIS — M722 Plantar fascial fibromatosis: Secondary | ICD-10-CM

## 2015-03-25 DIAGNOSIS — I10 Essential (primary) hypertension: Secondary | ICD-10-CM

## 2015-03-25 MED ORDER — NAPROXEN SODIUM 550 MG PO TABS: 550.0000 mg | ORAL_TABLET | Freq: Two times a day (BID) | ORAL | Status: DC

## 2015-03-25 MED ORDER — CHLORTHALIDONE 25 MG PO TABS: 25.0000 mg | ORAL_TABLET | Freq: Every day | ORAL | Status: DC

## 2015-03-25 MED ORDER — VALSARTAN 320 MG PO TABS
320.0000 mg | ORAL_TABLET | Freq: Every day | ORAL | Status: DC
Start: 2015-03-25 — End: 2015-10-17

## 2015-03-25 NOTE — Progress Notes (Signed)

## 2015-03-25 NOTE — Patient Instructions (Signed)
Plantar Fasciitis  Plantar fasciitis is a common condition that causes foot pain. It is soreness (inflammation) of the band of tough fibrous tissue on the bottom of the foot that runs from the heel bone (calcaneus) to the ball of the foot. The cause of this soreness may be from excessive standing, poor fitting shoes, running on hard surfaces, being overweight, having an abnormal walk, or overuse (this is common in runners) of the painful foot or feet. It is also common in aerobic exercise dancers and ballet dancers.  SYMPTOMS   Most people with plantar fasciitis complain of:   Severe pain in the morning on the bottom of their foot especially when taking the first steps out of bed. This pain recedes after a few minutes of walking.   Severe pain is experienced also during walking following a long period of inactivity.   Pain is worse when walking barefoot or up stairs  DIAGNOSIS    Your caregiver will diagnose this condition by examining and feeling your foot.   Special tests such as X-rays of your foot, are usually not needed.  PREVENTION    Consult a sports medicine professional before beginning a new exercise program.   Walking programs offer a good workout. With walking there is a lower chance of overuse injuries common to runners. There is less impact and less jarring of the joints.   Begin all new exercise programs slowly. If problems or pain develop, decrease the amount of time or distance until you are at a comfortable level.   Wear good shoes and replace them regularly.   Stretch your foot and the heel cords at the back of the ankle (Achilles tendon) both before and after exercise.   Run or exercise on even surfaces that are not hard. For example, asphalt is better than pavement.   Do not run barefoot on hard surfaces.   If using a treadmill, vary the incline.   Do not continue to workout if you have foot or joint problems. Seek professional help if they do not improve.  HOME CARE INSTRUCTIONS     Avoid activities that cause you pain until you recover.   Use ice or cold packs on the problem or painful areas after working out.   Only take over-the-counter or prescription medicines for pain, discomfort, or fever as directed by your caregiver.   Soft shoe inserts or athletic shoes with air or gel sole cushions may be helpful.   If problems continue or become more severe, consult a sports medicine caregiver or your own health care provider. Cortisone is a potent anti-inflammatory medication that may be injected into the painful area. You can discuss this treatment with your caregiver.  MAKE SURE YOU:    Understand these instructions.   Will watch your condition.   Will get help right away if you are not doing well or get worse.  Document Released: 06/07/2001 Document Revised: 12/05/2011 Document Reviewed: 08/06/2008  [employer] Patient Information 2015 [employer]. This information is not intended to replace advice given to you by your health care provider. Make sure you discuss any questions you have with your health care provider.

## 2015-03-25 NOTE — Progress Notes (Addendum)
Subjective:  Patient ID: Pamela Sims, female    DOB: [birth year]  Age: 64 y.o. MRN: [id number]  CC: Immunizations; Medication Refill; and Foot Injury   HPI Pamela Sims presents  with pain in both feet. She said she injured her left foot back in November. She apparently missed some steps and fell on her left foot she has pain in the plantar surface of her heel. She has pain with ambulation and standing. And that hasn't really improved with time.  She injured her right heel in the last month. She now has pain in her Achilles tendon where it inserts in the heel. Again has continued pain with standing and walking.  She has need for a tuberculosis skin test.  History Pamela Sims has a past medical history of Hypertension; Hiatal hernia; Arthritis; Complication of anesthesia; Anxiety; Depression; ADHD (attention deficit hyperactivity disorder); Asthma; Tuberculosis; GERD (gastroesophageal reflux disease); Headache(784.0); Shortness of breath; Family history of anesthesia complication; Diverticulosis; and Tubular adenoma (03/21/2008).   She has past surgical history that includes Cholecystectomy; Spine surgery; Abdominal hysterectomy; Replacement total knee; Tonsillectomy; Nose surgery; foot surgery; Total knee arthroplasty (Right, 04/17/2013); Cardiac catheterization; Joint replacement (Bilateral); Toe Surgery (Left); Cervical discectomy (02/06/2014); and Anterior cervical decomp/discectomy fusion (N/A, 02/06/2014).   Her  family history includes Cancer in her daughter, maternal grandmother, and paternal grandfather; Emphysema in her mother; Heart disease in her maternal grandfather; Hypertension in her father and mother; Stroke in her paternal grandmother.  She   reports that she quit smoking about 39 years ago. Her smoking use included Cigarettes. She has a 7.5 pack-year smoking history. She has never used smokeless tobacco. She reports that she does not drink alcohol or use illicit  drugs.  Outpatient Prescriptions Prior to Visit  Medication Sig Dispense Refill  . ARIPiprazole (ABILIFY) 5 MG tablet Take 5 mg by mouth every evening.     . escitalopram (LEXAPRO) 20 MG tablet Take 20 mg by mouth daily.    . valsartan (DIOVAN) 320 MG tablet Take 1 tablet (320 mg total) by mouth daily. PATIENT NEEDS OFFICE VISIT FOR ADDITIONAL REFILLS 30 tablet 0  . valsartan (DIOVAN) 320 MG tablet Take 320 mg by mouth daily.    Marland Kitchen albuterol (PROVENTIL HFA;VENTOLIN HFA) 108 (90 BASE) MCG/ACT inhaler Inhale 1 puff into the lungs every 6 (six) hours as needed for wheezing or shortness of breath. (Patient not taking: Reported on 03/25/2015) 1 Inhaler 0  . azithromycin (ZITHROMAX) 250 MG tablet Take 2 tabs PO x 1 dose, then 1 tab PO QD x 4 days (Patient not taking: Reported on 03/25/2015) 6 tablet 0  . chlorpheniramine-HYDROcodone (TUSSIONEX PENNKINETIC ER) 10-8 MG/5ML LQCR Take 5 mLs by mouth every 12 (twelve) hours as needed for cough (Cough). (Patient not taking: Reported on 03/25/2015) 115 mL 0  . chlorpheniramine-HYDROcodone (TUSSIONEX PENNKINETIC ER) 10-8 MG/5ML LQCR Take 5 mLs by mouth every 12 (twelve) hours as needed. (Patient not taking: Reported on 03/25/2015) 60 mL 0  . chlorpheniramine-HYDROcodone (TUSSIONEX) 10-8 MG/5ML LQCR Take 5 mLs by mouth at bedtime as needed for cough. (Patient not taking: Reported on 03/25/2015) 115 mL 0  . doxycycline (VIBRA-TABS) 100 MG tablet Take 1 tablet (100 mg total) by mouth 2 (two) times daily. (Patient not taking: Reported on 09/22/2014) 20 tablet 0  . Lisdexamfetamine Dimesylate (VYVANSE PO) Take by mouth.    . methylphenidate (CONCERTA) 54 MG CR tablet Take 54 mg by mouth every morning.    . chlorthalidone (HYGROTON) 25 MG tablet  Take 25 mg by mouth daily. One daily     No facility-administered medications prior to visit.    History   Social History  . Marital Status: Single    Spouse Name: N/A  . Number of Children: N/A  . Years of Education: N/A    Social History Main Topics  . Smoking status: Former Smoker -- 1.50 packs/day for 5 years    Types: Cigarettes    Quit date: 01/21/1976  . Smokeless tobacco: Never Used  . Alcohol Use: No  . Drug Use: No  . Sexual Activity: Not on file   Other Topics Concern  . None   Social History Narrative     Review of Systems  Constitutional: Negative for fever, chills and appetite change.  HENT: Negative for congestion, ear pain, postnasal drip, sinus pressure and sore throat.   Eyes: Negative for pain and redness.  Respiratory: Negative for cough, shortness of breath and wheezing.   Cardiovascular: Negative for leg swelling.  Gastrointestinal: Negative for nausea, vomiting, abdominal pain, diarrhea, constipation and blood in stool.  Endocrine: Negative for polyuria.  Genitourinary: Negative for dysuria, urgency, frequency and flank pain.  Musculoskeletal: Negative for gait problem.  Skin: Negative for rash.  Neurological: Negative for weakness and headaches.  Psychiatric/Behavioral: Negative for confusion and decreased concentration. The patient is not nervous/anxious.     Objective:  BP 138/88 mmHg  Pulse 84  Temp(Src) 98.6 F (37 C) (Oral)  Resp 20  Ht 5\' 6"  (1.676 m)  Wt 253 lb 12.8 oz (115.123 kg)  BMI 40.98 kg/m2  SpO2 98%  Physical Exam  Constitutional: She is oriented to person, place, and time. She appears well-developed and well-nourished.  HENT:  Head: Normocephalic and atraumatic.  Eyes: Conjunctivae are normal. Pupils are equal, round, and reactive to light.  Pulmonary/Chest: Effort normal.  Musculoskeletal: She exhibits no edema.       Right foot: There is tenderness.       Left foot: There is tenderness.  Neurological: She is alert and oriented to person, place, and time.  Skin: Skin is dry.  Psychiatric: She has a normal mood and affect. Her behavior is normal. Thought content normal.      Assessment & Plan:   [place] was seen today for  immunizations, medication refill and foot injury.  Diagnoses and all orders for this visit:  Plantar fasciitis of left foot Orders: -     Ambulatory referral to Podiatry  Screening-pulmonary TB Orders: -     DG Foot Complete Left; Future -     DG Foot Complete Right; Future -     Ambulatory referral to Podiatry -     TB Skin Test  Pain in joint, ankle and foot, unspecified laterality Orders: -     DG Foot Complete Left; Future -     DG Foot Complete Right; Future -     Ambulatory referral to Podiatry  Essential hypertension Orders: -     DG Foot Complete Left; Future -     DG Foot Complete Right; Future -     Ambulatory referral to Podiatry  Tendonitis, Achilles, right Orders: -     Ambulatory referral to Podiatry  Other orders -     valsartan (DIOVAN) 320 MG tablet; Take 1 tablet (320 mg total) by mouth daily. -     chlorthalidone (HYGROTON) 25 MG tablet; Take 1 tablet (25 mg total) by mouth daily. One daily -     naproxen sodium (ANAPROX  DS) 550 MG tablet; Take 1 tablet (550 mg total) by mouth 2 (two) times daily with a meal.   I have changed Ms. Mcfall's valsartan and chlorthalidone. I am also having her start on naproxen sodium. Additionally, I am having her maintain her methylphenidate, escitalopram, ARIPiprazole, doxycycline, Lisdexamfetamine Dimesylate (VYVANSE PO), chlorpheniramine-HYDROcodone, albuterol, chlorpheniramine-HYDROcodone, chlorpheniramine-HYDROcodone, azithromycin, and valsartan.  Meds ordered this encounter  Medications  . valsartan (DIOVAN) 320 MG tablet    Sig: Take 1 tablet (320 mg total) by mouth daily.    Dispense:  30 tablet    Refill:  5  . chlorthalidone (HYGROTON) 25 MG tablet    Sig: Take 1 tablet (25 mg total) by mouth daily. One daily    Dispense:  30 tablet    Refill:  5  . naproxen sodium (ANAPROX DS) 550 MG tablet    Sig: Take 1 tablet (550 mg total) by mouth 2 (two) times daily with a meal.    Dispense:  40 tablet    Refill:   0   She was referred to  Appropriate red flag conditions were discussed with the patient as well as actions that should be taken.  Patient expressed his understanding.  Follow-up: Return in about 1 week (around 04/01/2015).  Roselee Culver, MD   [facility] reading (PRIMARY) by  Dr. Ouida Sills.  Bilateral bone spurs.

## 2015-04-09 ENCOUNTER — Ambulatory Visit: Payer: BC Managed Care – PPO

## 2015-04-09 ENCOUNTER — Ambulatory Visit (INDEPENDENT_AMBULATORY_CARE_PROVIDER_SITE_OTHER): Payer: BC Managed Care – PPO

## 2015-04-09 ENCOUNTER — Ambulatory Visit (INDEPENDENT_AMBULATORY_CARE_PROVIDER_SITE_OTHER): Payer: BC Managed Care – PPO | Admitting: Podiatry

## 2015-04-09 ENCOUNTER — Encounter: Payer: Self-pay | Admitting: Podiatry

## 2015-04-09 VITALS — BP 140/81 | HR 83 | Resp 12

## 2015-04-09 DIAGNOSIS — M205X9 Other deformities of toe(s) (acquired), unspecified foot: Secondary | ICD-10-CM | POA: Diagnosis not present

## 2015-04-09 DIAGNOSIS — M7661 Achilles tendinitis, right leg: Secondary | ICD-10-CM

## 2015-04-09 DIAGNOSIS — M722 Plantar fascial fibromatosis: Secondary | ICD-10-CM | POA: Diagnosis not present

## 2015-04-09 DIAGNOSIS — M79673 Pain in unspecified foot: Secondary | ICD-10-CM

## 2015-04-09 MED ORDER — TRIAMCINOLONE ACETONIDE 10 MG/ML IJ SUSP
10.0000 mg | Freq: Once | INTRAMUSCULAR | Status: AC
  Administered 2015-04-09: 10 mg

## 2015-04-09 MED ORDER — DICLOFENAC SODIUM 75 MG PO TBEC: 75.0000 mg | DELAYED_RELEASE_TABLET | Freq: Two times a day (BID) | ORAL | Status: DC

## 2015-04-09 NOTE — Progress Notes (Signed)
   Subjective:    Patient ID: Pamela Sims, female    DOB: [birth year], 64 y.o.   MRN: [id number]  HPI Patient hurt her left heel falling down the stairs nearly 9 months ago. She also hurt her achilles in her R foot running a couple weeks ago. She has been to another doctor who diagnosed her with bone spurs.   Review of Systems  Constitutional: Positive for activity change, appetite change and unexpected weight change.  HENT: Positive for ear pain and trouble swallowing.   Respiratory: Positive for shortness of breath.   Cardiovascular: Positive for leg swelling.  Gastrointestinal: Positive for nausea and abdominal pain.  Genitourinary: Positive for urgency and frequency.  Musculoskeletal: Positive for back pain, joint swelling and gait problem.  Neurological: Positive for dizziness and headaches.  All other systems reviewed and are negative.      Objective:   Physical Exam        Assessment & Plan:

## 2015-04-09 NOTE — Progress Notes (Signed)
Subjective:     Patient ID: Pamela Sims, female   DOB: Jun 22, 1951, 64 y.o.   MRN: [id number]  HPI patient states I'm having a lot of pain in my left plantar heel and slight discomfort in the back of my right heel along with flatfeet deformity and problems with my big toe joint right over left with limitation of motion   Review of Systems  All other systems reviewed and are negative.      Objective:   Physical Exam  Constitutional: She is oriented to person, place, and time.  Cardiovascular: Intact distal pulses.   Musculoskeletal: Normal range of motion.  Neurological: She is oriented to person, place, and time.  Skin: Skin is warm.  Nursing note and vitals reviewed.  neurovascular status intact muscle strength adequate range of motion subtalar midtarsal joint within normal limits with severe loss of motion first MPJ right with spur formation dorsally and exquisite discomfort plantar aspect of the left heel at the insertion tendon the calcaneus and on the right posterior lateral heel there is discomfort also when palpated. 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 I injected the left plantar fascia 3 mg Kenalog 5 mg Xylocaine and went ahead today and dispensed fascial brace with instructions. Gave instructions for ice therapy for the posterior heel right placed on Voltaren 75 mg twice a day and discussed procedure for hallux limitus depending on how it does. Patient will be rechecked in 3 weeks and will be a good long-term orthotic candidate

## 2015-04-09 NOTE — Patient Instructions (Signed)
Achilles Tendinitis  with Rehab Achilles tendinitis is a disorder of the Achilles tendon. The Achilles tendon connects the large calf muscles (Gastrocnemius and Soleus) to the heel bone (calcaneus). This tendon is sometimes called the heel cord. It is important for pushing-off and standing on your toes and is important for walking, running, or jumping. Tendinitis is often caused by overuse and repetitive microtrauma. SYMPTOMS  Pain, tenderness, swelling, warmth, and redness may occur over the Achilles tendon even at rest.  Pain with pushing off, or flexing or extending the ankle.  Pain that is worsened after or during activity. CAUSES   Overuse sometimes seen with rapid increase in exercise programs or in sports requiring running and jumping.  Poor physical conditioning (strength and flexibility or endurance).  Running sports, especially training running down hills.  Inadequate warm-up before practice or play or failure to stretch before participation.  Injury to the tendon. PREVENTION   Warm up and stretch before practice or competition.  Allow time for adequate rest and recovery between practices and competition.  Keep up conditioning.  Keep up ankle and leg flexibility.  Improve or keep muscle strength and endurance.  Improve cardiovascular fitness.  Use proper technique.  Use proper equipment (shoes, skates).  To help prevent recurrence, taping, protective strapping, or an adhesive bandage may be recommended for several weeks after healing is complete. PROGNOSIS   Recovery may take weeks to several months to heal.  Longer recovery is expected if symptoms have been prolonged.  Recovery is usually quicker if the inflammation is due to a direct blow as compared with overuse or sudden strain. RELATED COMPLICATIONS   Healing time will be prolonged if the condition is not correctly treated. The injury must be given plenty of time to heal.  Symptoms can reoccur if  activity is resumed too soon.  Untreated, tendinitis may increase the risk of tendon rupture requiring additional time for recovery and possibly surgery. TREATMENT   The first treatment consists of rest anti-inflammatory medication, and ice to relieve the pain.  Stretching and strengthening exercises after resolution of pain will likely help reduce the risk of recurrence. Referral to a physical therapist or athletic trainer for further evaluation and treatment may be helpful.  A walking boot or cast may be recommended to rest the Achilles tendon. This can help break the cycle of inflammation and microtrauma.  Arch supports (orthotics) may be prescribed or recommended by your caregiver as an adjunct to therapy and rest.  Surgery to remove the inflamed tendon lining or degenerated tendon tissue is rarely necessary and has shown less than predictable results. MEDICATION   Nonsteroidal anti-inflammatory medications, such as aspirin and ibuprofen, may be used for pain and inflammation relief. Do not take within 7 days before surgery. Take these as directed by your caregiver. Contact your caregiver immediately if any bleeding, stomach upset, or signs of allergic reaction occur. Other minor pain relievers, such as acetaminophen, may also be used.  Pain relievers may be prescribed as necessary by your caregiver. Do not take prescription pain medication for longer than 4 to 7 days. Use only as directed and only as much as you need.  Cortisone injections are rarely indicated. Cortisone injections may weaken tendons and predispose to rupture. It is better to give the condition more time to heal than to use them. HEAT AND COLD  Cold is used to relieve pain and reduce inflammation for acute and chronic Achilles tendinitis. Cold should be applied for 10 to 15 minutes   every 2 to 3 hours for inflammation and pain and immediately after any activity that aggravates your symptoms. Use ice packs or an ice  massage.  Heat may be used before performing stretching and strengthening activities prescribed by your caregiver. Use a heat pack or a warm soak. SEEK MEDICAL CARE IF:  Symptoms get worse or do not improve in 2 weeks despite treatment.  New, unexplained symptoms develop. Drugs used in treatment may produce side effects.  EXERCISES:  RANGE OF MOTION (ROM) AND STRETCHING EXERCISES - Achilles Tendinitis  These exercises may help you when beginning to rehabilitate your injury. Your symptoms may resolve with or without further involvement from your physician, physical therapist or athletic trainer. While completing these exercises, remember:   Restoring tissue flexibility helps normal motion to return to the joints. This allows healthier, less painful movement and activity.  An effective stretch should be held for at least 30 seconds.  A stretch should never be painful. You should only feel a gentle lengthening or release in the stretched tissue.  STRETCH  Gastroc, Standing   Place hands on wall.  Extend right / left leg, keeping the front knee somewhat bent.  Slightly point your toes inward on your back foot.  Keeping your right / left heel on the floor and your knee straight, shift your weight toward the wall, not allowing your back to arch.  You should feel a gentle stretch in the right / left calf. Hold this position for 10 seconds. Repeat 3 times. Complete this stretch 2 times per day.  STRETCH  Soleus, Standing   Place hands on wall.  Extend right / left leg, keeping the other knee somewhat bent.  Slightly point your toes inward on your back foot.  Keep your right / left heel on the floor, bend your back knee, and slightly shift your weight over the back leg so that you feel a gentle stretch deep in your back calf.  Hold this position for 10 seconds. Repeat 3 times. Complete this stretch 2 times per day.  STRETCH  Gastrocsoleus, Standing  Note: This exercise can place  a lot of stress on your foot and ankle. Please complete this exercise only if specifically instructed by your caregiver.   Place the ball of your right / left foot on a step, keeping your other foot firmly on the same step.  Hold on to the wall or a rail for balance.  Slowly lift your other foot, allowing your body weight to press your heel down over the edge of the step.  You should feel a stretch in your right / left calf.  Hold this position for 10 seconds.  Repeat this exercise with a slight bend in your knee. Repeat 3 times. Complete this stretch 2 times per day.   STRENGTHENING EXERCISES - Achilles Tendinitis These exercises may help you when beginning to rehabilitate your injury. They may resolve your symptoms with or without further involvement from your physician, physical therapist or athletic trainer. While completing these exercises, remember:   Muscles can gain both the endurance and the strength needed for everyday activities through controlled exercises.  Complete these exercises as instructed by your physician, physical therapist or athletic trainer. Progress the resistance and repetitions only as guided.  You may experience muscle soreness or fatigue, but the pain or discomfort you are trying to eliminate should never worsen during these exercises. If this pain does worsen, stop and make certain you are following the directions exactly. If   the pain is still present after adjustments, discontinue the exercise until you can discuss the trouble with your clinician.  STRENGTH - Plantar-flexors   Sit with your right / left leg extended. Holding onto both ends of a rubber exercise band/tubing, loop it around the ball of your foot. Keep a slight tension in the band.  Slowly push your toes away from you, pointing them downward.  Hold this position for 10 seconds. Return slowly, controlling the tension in the band/tubing. Repeat 3 times. Complete this exercise 2 times per day.     STRENGTH - Plantar-flexors   Stand with your feet shoulder width apart. Steady yourself with a wall or table using as little support as needed.  Keeping your weight evenly spread over the width of your feet, rise up on your toes.*  Hold this position for 10 seconds. Repeat 3 times. Complete this exercise 2 times per day.  *If this is too easy, shift your weight toward your right / left leg until you feel challenged. Ultimately, you may be asked to do this exercise with your right / left foot only.  STRENGTH  Plantar-flexors, Eccentric  Note: This exercise can place a lot of stress on your foot and ankle. Please complete this exercise only if specifically instructed by your caregiver.   Place the balls of your feet on a step. With your hands, use only enough support from a wall or rail to keep your balance.  Keep your knees straight and rise up on your toes.  Slowly shift your weight entirely to your right / left toes and pick up your opposite foot. Gently and with controlled movement, lower your weight through your right / left foot so that your heel drops below the level of the step. You will feel a slight stretch in the back of your calf at the end position.  Use the healthy leg to help rise up onto the balls of both feet, then lower weight only on the right / left leg again. Build up to 15 repetitions. Then progress to 3 consecutive sets of 15 repetitions.*  After completing the above exercise, complete the same exercise with a slight knee bend (about 30 degrees). Again, build up to 15 repetitions. Then progress to 3 consecutive sets of 15 repetitions.* Perform this exercise 2 times per day.  *When you easily complete 3 sets of 15, your physician, physical therapist or athletic trainer may advise you to add resistance by wearing a backpack filled with additional weight.  STRENGTH - Plantar Flexors, Seated   Sit on a chair that allows your feet to rest flat on the ground. If  necessary, sit at the edge of the chair.  Keeping your toes firmly on the ground, lift your right / left heel as far as you can without increasing any discomfort in your ankle. Repeat 3 times. Complete this exercise 2 times a day.    Plantar Fasciitis (Heel Spur Syndrome) with Rehab The plantar fascia is a fibrous, ligament-like, soft-tissue structure that spans the bottom of the foot. Plantar fasciitis is a condition that causes pain in the foot due to inflammation of the tissue. SYMPTOMS   Pain and tenderness on the underneath side of the foot.  Pain that worsens with standing or walking. CAUSES  Plantar fasciitis is caused by irritation and injury to the plantar fascia on the underneath side of the foot. Common mechanisms of injury include:  Direct trauma to bottom of the foot.  Damage to a small   nerve that runs under the foot where the main fascia attaches to the heel bone.  Stress placed on the plantar fascia due to bone spurs. RISK INCREASES WITH:   Activities that place stress on the plantar fascia (running, jumping, pivoting, or cutting).  Poor strength and flexibility.  Improperly fitted shoes.  Tight calf muscles.  Flat feet.  Failure to warm-up properly before activity.  Obesity. PREVENTION  Warm up and stretch properly before activity.  Allow for adequate recovery between workouts.  Maintain physical fitness:  Strength, flexibility, and endurance.  Cardiovascular fitness.  Maintain a health body weight.  Avoid stress on the plantar fascia.  Wear properly fitted shoes, including arch supports for individuals who have flat feet.  PROGNOSIS  If treated properly, then the symptoms of plantar fasciitis usually resolve without surgery. However, occasionally surgery is necessary.  RELATED COMPLICATIONS   Recurrent symptoms that may result in a chronic condition.  Problems of the lower back that are caused by compensating for the injury, such as  limping.  Pain or weakness of the foot during push-off following surgery.  Chronic inflammation, scarring, and partial or complete fascia tear, occurring more often from repeated injections.  TREATMENT  Treatment initially involves the use of ice and medication to help reduce pain and inflammation. The use of strengthening and stretching exercises may help reduce pain with activity, especially stretches of the Achilles tendon. These exercises may be performed at home or with a therapist. Your caregiver may recommend that you use heel cups of arch supports to help reduce stress on the plantar fascia. Occasionally, corticosteroid injections are given to reduce inflammation. If symptoms persist for greater than 6 months despite non-surgical (conservative), then surgery may be recommended.   MEDICATION   If pain medication is necessary, then nonsteroidal anti-inflammatory medications, such as aspirin and ibuprofen, or other minor pain relievers, such as acetaminophen, are often recommended.  Do not take pain medication within 7 days before surgery.  Prescription pain relievers may be given if deemed necessary by your caregiver. Use only as directed and only as much as you need.  Corticosteroid injections may be given by your caregiver. These injections should be reserved for the most serious cases, because they may only be given a certain number of times.  HEAT AND COLD  Cold treatment (icing) relieves pain and reduces inflammation. Cold treatment should be applied for 10 to 15 minutes every 2 to 3 hours for inflammation and pain and immediately after any activity that aggravates your symptoms. Use ice packs or massage the area with a piece of ice (ice massage).  Heat treatment may be used prior to performing the stretching and strengthening activities prescribed by your caregiver, physical therapist, or athletic trainer. Use a heat pack or soak the injury in warm water.  SEEK IMMEDIATE MEDICAL  CARE IF:  Treatment seems to offer no benefit, or the condition worsens.  Any medications produce adverse side effects.  EXERCISES- RANGE OF MOTION (ROM) AND STRETCHING EXERCISES - Plantar Fasciitis (Heel Spur Syndrome) These exercises may help you when beginning to rehabilitate your injury. Your symptoms may resolve with or without further involvement from your physician, physical therapist or athletic trainer. While completing these exercises, remember:   Restoring tissue flexibility helps normal motion to return to the joints. This allows healthier, less painful movement and activity.  An effective stretch should be held for at least 30 seconds.  A stretch should never be painful. You should only feel a gentle lengthening   or release in the stretched tissue.  RANGE OF MOTION - Toe Extension, Flexion  Sit with your right / left leg crossed over your opposite knee.  Grasp your toes and gently pull them back toward the top of your foot. You should feel a stretch on the bottom of your toes and/or foot.  Hold this stretch for 10 seconds.  Now, gently pull your toes toward the bottom of your foot. You should feel a stretch on the top of your toes and or foot.  Hold this stretch for 10 seconds. Repeat  times. Complete this stretch 3 times per day.   RANGE OF MOTION - Ankle Dorsiflexion, Active Assisted  Remove shoes and sit on a chair that is preferably not on a carpeted surface.  Place right / left foot under knee. Extend your opposite leg for support.  Keeping your heel down, slide your right / left foot back toward the chair until you feel a stretch at your ankle or calf. If you do not feel a stretch, slide your bottom forward to the edge of the chair, while still keeping your heel down.  Hold this stretch for 10 seconds. Repeat 3 times. Complete this stretch 2 times per day.   STRETCH  Gastroc, Standing  Place hands on wall.  Extend right / left leg, keeping the front knee  somewhat bent.  Slightly point your toes inward on your back foot.  Keeping your right / left heel on the floor and your knee straight, shift your weight toward the wall, not allowing your back to arch.  You should feel a gentle stretch in the right / left calf. Hold this position for 10 seconds. Repeat 3 times. Complete this stretch 2 times per day.  STRETCH  Soleus, Standing  Place hands on wall.  Extend right / left leg, keeping the other knee somewhat bent.  Slightly point your toes inward on your back foot.  Keep your right / left heel on the floor, bend your back knee, and slightly shift your weight over the back leg so that you feel a gentle stretch deep in your back calf.  Hold this position for 10 seconds. Repeat 3 times. Complete this stretch 2 times per day.  STRETCH  Gastrocsoleus, Standing  Note: This exercise can place a lot of stress on your foot and ankle. Please complete this exercise only if specifically instructed by your caregiver.   Place the ball of your right / left foot on a step, keeping your other foot firmly on the same step.  Hold on to the wall or a rail for balance.  Slowly lift your other foot, allowing your body weight to press your heel down over the edge of the step.  You should feel a stretch in your right / left calf.  Hold this position for 10 seconds.  Repeat this exercise with a slight bend in your right / left knee. Repeat 3 times. Complete this stretch 2 times per day.   STRENGTHENING EXERCISES - Plantar Fasciitis (Heel Spur Syndrome)  These exercises may help you when beginning to rehabilitate your injury. They may resolve your symptoms with or without further involvement from your physician, physical therapist or athletic trainer. While completing these exercises, remember:   Muscles can gain both the endurance and the strength needed for everyday activities through controlled exercises.  Complete these exercises as instructed by  your physician, physical therapist or athletic trainer. Progress the resistance and repetitions only as guided.  STRENGTH -   Towel Curls  Sit in a chair positioned on a non-carpeted surface.  Place your foot on a towel, keeping your heel on the floor.  Pull the towel toward your heel by only curling your toes. Keep your heel on the floor. Repeat 3 times. Complete this exercise 2 times per day.  STRENGTH - Ankle Inversion  Secure one end of a rubber exercise band/tubing to a fixed object (table, pole). Loop the other end around your foot just before your toes.  Place your fists between your knees. This will focus your strengthening at your ankle.  Slowly, pull your big toe up and in, making sure the band/tubing is positioned to resist the entire motion.  Hold this position for 10 seconds.  Have your muscles resist the band/tubing as it slowly pulls your foot back to the starting position. Repeat 3 times. Complete this exercises 2 times per day.  Document Released: 09/12/2005 Document Revised: 12/05/2011 Document Reviewed: 12/25/2008 [employer] Patient Information 2014 [employer]  

## 2015-04-13 ENCOUNTER — Ambulatory Visit (INDEPENDENT_AMBULATORY_CARE_PROVIDER_SITE_OTHER): Payer: BC Managed Care – PPO | Admitting: *Deleted

## 2015-04-13 DIAGNOSIS — Z111 Encounter for screening for respiratory tuberculosis: Secondary | ICD-10-CM | POA: Diagnosis not present

## 2015-04-13 NOTE — Progress Notes (Addendum)
PPD was placed on 04/13/2015 at 7:20 pm on her right forearm. It was the second in the series that she has received.  Tuberculosis Risk Questionnaire  1. No Were you born outside the [place] in one of the following parts of the world: [place], [place], [place], [place] or [place]?    2. No Have you traveled outside the [place] and lived for more than one month in one of the following parts of the world: [place], [place], [place], [place] or [place]?    3. No Do you have a compromised immune system such as from any of the following conditions:HIV/AIDS, organ or bone marrow transplantation, diabetes, immunosuppressive medicines (e.g. Prednisone, Remicaide), leukemia, lymphoma, cancer of the head or neck, gastrectomy or jejunal bypass, end-stage renal disease (on dialysis), or silicosis?     4. No Have you ever or do you plan on working in: a residential care center, a health care facility, a jail or prison or homeless shelter?    5. No Have you ever: injected illegal drugs, used crack cocaine, lived in a homeless shelter  or been in jail or prison?     6. No Have you ever been exposed to anyone with infectious tuberculosis?    Tuberculosis Symptom Questionnaire  Do you currently have any of the following symptoms?  1. No Unexplained cough lasting more than 3 weeks?   2. No Unexplained fever lasting more than 3 weeks.   3. No Night Sweats (sweating that leaves the bedclothes and sheets wet)     4. No Shortness of Breath   5. No Chest Pain   6. No Unintentional weight loss    7. No Unexplained fatigue (very tired for no reason)

## 2015-04-13 NOTE — Addendum Note (Signed)
Addended by: Roselee Nova A on: 04/13/2015 08:12 PM   Modules accepted: Orders

## 2015-04-13 NOTE — Progress Notes (Deleted)
   Subjective:    Patient ID: Pamela Sims, female    DOB: [birth year], 64 y.o.   MRN: [id number]  HPI    Review of Systems     Objective:   Physical Exam        Assessment & Plan:

## 2015-04-13 NOTE — Progress Notes (Deleted)
   Subjective:    Patient ID: Pamela Sims, female    DOB: 1951/04/25, 64 y.o.   MRN: [id number]  HPI    Review of Systems     Objective:   Physical Exam        Assessment & Plan:

## 2015-04-15 ENCOUNTER — Ambulatory Visit (INDEPENDENT_AMBULATORY_CARE_PROVIDER_SITE_OTHER): Payer: BC Managed Care – PPO

## 2015-04-15 DIAGNOSIS — Z7689 Persons encountering health services in other specified circumstances: Secondary | ICD-10-CM

## 2015-04-15 DIAGNOSIS — Z111 Encounter for screening for respiratory tuberculosis: Secondary | ICD-10-CM

## 2015-04-15 LAB — TB SKIN TEST
Induration: 0 mm
TB Skin Test: NEGATIVE

## 2015-04-29 ENCOUNTER — Ambulatory Visit (INDEPENDENT_AMBULATORY_CARE_PROVIDER_SITE_OTHER): Payer: BC Managed Care – PPO | Admitting: Emergency Medicine

## 2015-04-29 VITALS — BP 138/87 | HR 84 | Temp 97.6°F | Resp 18 | Ht 67.0 in | Wt 251.6 lb

## 2015-04-29 DIAGNOSIS — J209 Acute bronchitis, unspecified: Secondary | ICD-10-CM

## 2015-04-29 MED ORDER — CLARITHROMYCIN 500 MG PO TABS: 500.0000 mg | ORAL_TABLET | Freq: Two times a day (BID) | ORAL | Status: DC

## 2015-04-29 MED ORDER — HYDROCOD POLST-CPM POLST ER 10-8 MG/5ML PO SUER: 5.0000 mL | Freq: Two times a day (BID) | ORAL | Status: DC

## 2015-04-29 NOTE — Progress Notes (Signed)
Subjective:  Patient ID: Pamela Sims, female    DOB: [birth year]  Age: 64 y.o. MRN: [id number]  CC: Cough; Headache; and Facial Pain   HPI Pamela Sims presents   Week long history of cough. She has mucopurulent sputum. Has no wheezing using her albuterol MDI. She has some exertional shortness breath. She has chills and sweats. But no documented fever. She has no nausea vomiting or stool change. No rash. No improvement with over-the-counter medication. She has no nasal congestion postnasal drainage. She has no sore throat.  History Pamela Sims has a past medical history of Hypertension; Hiatal hernia; Arthritis; Complication of anesthesia; Anxiety; Depression; ADHD (attention deficit hyperactivity disorder); Asthma; Tuberculosis; GERD (gastroesophageal reflux disease); Headache(784.0); Shortness of breath; Family history of anesthesia complication; Diverticulosis; and Tubular adenoma (03/21/2008).   She has past surgical history that includes Cholecystectomy; Spine surgery; Abdominal hysterectomy; Replacement total knee; Tonsillectomy; Nose surgery; foot surgery; Total knee arthroplasty (Right, 04/17/2013); Cardiac catheterization; Joint replacement (Bilateral); Toe Surgery (Left); Cervical discectomy (02/06/2014); and Anterior cervical decomp/discectomy fusion (N/A, 02/06/2014).   Her  family history includes Cancer in her daughter, maternal grandmother, and paternal grandfather; Emphysema in her mother; Heart disease in her maternal grandfather; Hypertension in her father and mother; Stroke in her paternal grandmother.  She   reports that she quit smoking about 39 years ago. Her smoking use included Cigarettes. She has a 7.5 pack-year smoking history. She has never used smokeless tobacco. She reports that she does not drink alcohol or use illicit drugs.  Outpatient Prescriptions Prior to Visit  Medication Sig Dispense Refill  . albuterol (PROVENTIL HFA;VENTOLIN HFA) 108 (90 BASE) MCG/ACT  inhaler Inhale 1 puff into the lungs every 6 (six) hours as needed for wheezing or shortness of breath. 1 Inhaler 0  . chlorpheniramine-HYDROcodone (TUSSIONEX PENNKINETIC ER) 10-8 MG/5ML LQCR Take 5 mLs by mouth every 12 (twelve) hours as needed for cough (Cough). 115 mL 0  . chlorthalidone (HYGROTON) 25 MG tablet Take 1 tablet (25 mg total) by mouth daily. One daily 30 tablet 5  . diazepam (VALIUM) 5 MG tablet 2.5 mg.    . diclofenac (VOLTAREN) 75 MG EC tablet Take 1 tablet (75 mg total) by mouth 2 (two) times daily. 50 tablet 2  . escitalopram (LEXAPRO) 20 MG tablet Take 20 mg by mouth daily.    . phentermine (ADIPEX-P) 37.5 MG tablet     . topiramate (TOPAMAX) 50 MG tablet Take 50 mg by mouth 2 (two) times daily.    . valsartan (DIOVAN) 320 MG tablet Take 1 tablet (320 mg total) by mouth daily. PATIENT NEEDS OFFICE VISIT FOR ADDITIONAL REFILLS 30 tablet 0  . naproxen sodium (ANAPROX DS) 550 MG tablet Take 1 tablet (550 mg total) by mouth 2 (two) times daily with a meal. 40 tablet 0  . ARIPiprazole (ABILIFY) 5 MG tablet Take 5 mg by mouth every evening.     Marland Kitchen azithromycin (ZITHROMAX) 250 MG tablet Take 2 tabs PO x 1 dose, then 1 tab PO QD x 4 days (Patient not taking: Reported on 03/25/2015) 6 tablet 0  . chlorpheniramine-HYDROcodone (TUSSIONEX PENNKINETIC ER) 10-8 MG/5ML LQCR Take 5 mLs by mouth every 12 (twelve) hours as needed. (Patient not taking: Reported on 03/25/2015) 60 mL 0  . chlorpheniramine-HYDROcodone (TUSSIONEX) 10-8 MG/5ML LQCR Take 5 mLs by mouth at bedtime as needed for cough. (Patient not taking: Reported on 03/25/2015) 115 mL 0  . doxycycline (VIBRA-TABS) 100 MG tablet Take 1 tablet (100 mg total)  by mouth 2 (two) times daily. (Patient not taking: Reported on 09/22/2014) 20 tablet 0  . Lisdexamfetamine Dimesylate (VYVANSE PO) Take by mouth.    . methylphenidate (CONCERTA) 54 MG CR tablet Take 54 mg by mouth every morning.    . valsartan (DIOVAN) 320 MG tablet Take 1 tablet (320 mg  total) by mouth daily. 30 tablet 5   No facility-administered medications prior to visit.    History   Social History  . Marital Status: Single    Spouse Name: N/A  . Number of Children: N/A  . Years of Education: N/A   Social History Main Topics  . Smoking status: Former Smoker -- 1.50 packs/day for 5 years    Types: Cigarettes    Quit date: 01/21/1976  . Smokeless tobacco: Never Used  . Alcohol Use: No  . Drug Use: No  . Sexual Activity: Not on file   Other Topics Concern  . None   Social History Narrative     Review of Systems  Constitutional: Negative for fever, chills and appetite change.  HENT: Negative for congestion, ear pain, postnasal drip, sinus pressure and sore throat.   Eyes: Negative for pain and redness.  Respiratory: Negative for cough, shortness of breath and wheezing.   Cardiovascular: Negative for leg swelling.  Gastrointestinal: Negative for nausea, vomiting, abdominal pain, diarrhea, constipation and blood in stool.  Endocrine: Negative for polyuria.  Genitourinary: Negative for dysuria, urgency, frequency and flank pain.  Musculoskeletal: Negative for gait problem.  Skin: Negative for rash.  Neurological: Negative for weakness and headaches.  Psychiatric/Behavioral: Negative for confusion and decreased concentration. The patient is not nervous/anxious.     Objective:  BP 138/87 mmHg  Pulse 84  Temp(Src) 97.6 F (36.4 C) (Oral)  Resp 18  Ht 5\' 7"  (1.702 m)  Wt 251 lb 9.6 oz (114.125 kg)  BMI 39.40 kg/m2  SpO2 98%  Physical Exam  Constitutional: She is oriented to person, place, and time. She appears well-developed and well-nourished. No distress.  HENT:  Head: Normocephalic and atraumatic.  Right Ear: External ear normal.  Left Ear: External ear normal.  Nose: Nose normal.  Eyes: Conjunctivae and EOM are normal. Pupils are equal, round, and reactive to light. No scleral icterus.  Neck: Normal range of motion. Neck supple. No  tracheal deviation present.  Cardiovascular: Normal rate, regular rhythm and normal heart sounds.   Pulmonary/Chest: Effort normal. No respiratory distress. She has no wheezes. She has no rales.  Abdominal: She exhibits no mass. There is no tenderness. There is no rebound and no guarding.  Musculoskeletal: She exhibits no edema.  Lymphadenopathy:    She has no cervical adenopathy.  Neurological: She is alert and oriented to person, place, and time. Coordination normal.  Skin: Skin is warm and dry. No rash noted.  Psychiatric: She has a normal mood and affect. Her behavior is normal.      Assessment & Plan:   Pamela Sims was seen today for cough, headache and facial pain.  Diagnoses and all orders for this visit:  Acute bronchitis, unspecified organism  Other orders -     chlorpheniramine-HYDROcodone (TUSSIONEX PENNKINETIC ER) 10-8 MG/5ML SUER; Take 5 mLs by mouth 2 (two) times daily. -     clarithromycin (BIAXIN) 500 MG tablet; Take 1 tablet (500 mg total) by mouth 2 (two) times daily.  I have discontinued Pamela Sims's methylphenidate, ARIPiprazole, doxycycline, Lisdexamfetamine Dimesylate (VYVANSE PO), and azithromycin. I am also having her start on chlorpheniramine-HYDROcodone and clarithromycin. Additionally,  I am having her maintain her escitalopram, chlorpheniramine-HYDROcodone, albuterol, valsartan, chlorthalidone, naproxen sodium, diazepam, phentermine, topiramate, and diclofenac.  Meds ordered this encounter  Medications  . chlorpheniramine-HYDROcodone (TUSSIONEX PENNKINETIC ER) 10-8 MG/5ML SUER    Sig: Take 5 mLs by mouth 2 (two) times daily.    Dispense:  60 mL    Refill:  0  . clarithromycin (BIAXIN) 500 MG tablet    Sig: Take 1 tablet (500 mg total) by mouth 2 (two) times daily.    Dispense:  20 tablet    Refill:  0    Appropriate red flag conditions were discussed with the patient as well as actions that should be taken.  Patient expressed his  understanding.  Follow-up: Return if symptoms worsen or fail to improve.  Roselee Culver, MD

## 2015-04-29 NOTE — Patient Instructions (Signed)

## 2015-04-30 ENCOUNTER — Encounter: Payer: Self-pay | Admitting: Podiatry

## 2015-04-30 ENCOUNTER — Ambulatory Visit (INDEPENDENT_AMBULATORY_CARE_PROVIDER_SITE_OTHER): Payer: BC Managed Care – PPO | Admitting: Podiatry

## 2015-04-30 VITALS — BP 93/52 | HR 85 | Resp 18

## 2015-04-30 DIAGNOSIS — M722 Plantar fascial fibromatosis: Secondary | ICD-10-CM | POA: Diagnosis not present

## 2015-04-30 DIAGNOSIS — M7661 Achilles tendinitis, right leg: Secondary | ICD-10-CM

## 2015-04-30 DIAGNOSIS — M205X9 Other deformities of toe(s) (acquired), unspecified foot: Secondary | ICD-10-CM

## 2015-04-30 MED ORDER — TRIAMCINOLONE ACETONIDE 10 MG/ML IJ SUSP
10.0000 mg | Freq: Once | INTRAMUSCULAR | Status: AC
  Administered 2015-04-30: 10 mg

## 2015-04-30 NOTE — Progress Notes (Signed)
Subjective:     Patient ID: Pamela Sims, female   DOB: 04/06/1951, 64 y.o.   MRN: [id number]  HPI patient states I'm doing some better but I am still having a lot of pain in my heels especially in the morning and I still have a week arch and flatfoot deformity along with problems with my big toe joint right over left   Review of Systems     Objective:   Physical Exam Neurovascular status intact muscle strength is adequate with inflammation of the plantar heel still noted left and significant range of motion loss first MPJ bilateral with mild crepitus. The patient is very painful when I pressed into the plantar heel left    Assessment:     Continue plantar fasciitis left foot with moderate depression of the arch noted and long-term hallux limitus deformity    Plan:     Educated patient on deformity and reinjected the plantar fascial left 3 mg Kenalog 5 mg Xylocaine and dispensed night splint with all instructions on usage. Patient is scanned for custom orthotic Berkley type devices in order to reduce the stress against her foot arch and heel

## 2015-05-22 ENCOUNTER — Ambulatory Visit: Payer: BC Managed Care – PPO | Admitting: *Deleted

## 2015-05-22 DIAGNOSIS — M722 Plantar fascial fibromatosis: Secondary | ICD-10-CM

## 2015-05-22 NOTE — Progress Notes (Signed)
Patient ID: Pamela Sims, female   DOB: 1951/05/21, 64 y.o.   MRN: [id number] Patient presents for orthotic pick up.  Verbal and written break in and wear instructions given.  Patient will follow up in 4 weeks if symptoms worsen or fail to improve.

## 2015-05-22 NOTE — Patient Instructions (Signed)

## 2015-07-15 ENCOUNTER — Ambulatory Visit: Payer: BC Managed Care – PPO | Admitting: Podiatry

## 2015-07-20 ENCOUNTER — Encounter: Payer: Self-pay | Admitting: Podiatry

## 2015-07-20 ENCOUNTER — Ambulatory Visit (INDEPENDENT_AMBULATORY_CARE_PROVIDER_SITE_OTHER): Payer: BC Managed Care – PPO | Admitting: Podiatry

## 2015-07-20 VITALS — BP 116/65 | HR 69 | Resp 16

## 2015-07-20 DIAGNOSIS — M722 Plantar fascial fibromatosis: Secondary | ICD-10-CM | POA: Diagnosis not present

## 2015-07-20 NOTE — Patient Instructions (Signed)
Pre-Operative Instructions  Congratulations, you have decided to take an important step to improving your quality of life.  You can be assured that the doctors of [facility] will be with you every step of the way.  1. Plan to be at the surgery center/hospital at least 1 (one) hour prior to your scheduled time unless otherwise directed by the surgical center/hospital staff.  You must have a responsible adult accompany you, remain during the surgery and drive you home.  Make sure you have directions to the surgical center/hospital and know how to get there on time. 2. For hospital based surgery you will need to obtain a history and physical form from your family physician within 1 month prior to the date of surgery- we will give you a form for you primary physician.  3. We make every effort to accommodate the date you request for surgery.  There are however, times where surgery dates or times have to be moved.  We will contact you as soon as possible if a change in schedule is required.   4. No Aspirin/Ibuprofen for one week before surgery.  If you are on aspirin, any non-steroidal anti-inflammatory medications (Mobic, Aleve, Ibuprofen) you should stop taking it 7 days prior to your surgery.  You make take Tylenol  For pain prior to surgery.  5. Medications- If you are taking daily heart and blood pressure medications, seizure, reflux, allergy, asthma, anxiety, pain or diabetes medications, make sure the surgery center/hospital is aware before the day of surgery so they may notify you which medications to take or avoid the day of surgery. 6. No food or drink after midnight the night before surgery unless directed otherwise by surgical center/hospital staff. 7. No alcoholic beverages 24 hours prior to surgery.  No smoking 24 hours prior to or 24 hours after surgery. 8. Wear loose pants or shorts- loose enough to fit over bandages, boots, and casts. 9. No slip on shoes, sneakers are best. 10. Bring  your boot with you to the surgery center/hospital.  Also bring crutches or a walker if your physician has prescribed it for you.  If you do not have this equipment, it will be provided for you after surgery. 11. If you have not been contracted by the surgery center/hospital by the day before your surgery, call to confirm the date and time of your surgery. 12. Leave-time from work may vary depending on the type of surgery you have.  Appropriate arrangements should be made prior to surgery with your employer. 13. Prescriptions will be provided immediately following surgery by your doctor.  Have these filled as soon as possible after surgery and take the medication as directed. 14. Remove nail polish on the operative foot. 15. Wash the night before surgery.  The night before surgery wash the foot and leg well with the antibacterial soap provided and water paying special attention to beneath the toenails and in between the toes.  Rinse thoroughly with water and dry well with a towel.  Perform this wash unless told not to do so by your physician.  Enclosed: 1 Ice pack (please put in freezer the night before surgery)   1 Hibiclens skin cleaner   Pre-op Instructions  If you have any questions regarding the instructions, do not hesitate to call our office.  [place]: [address] [place], [place] [address] [phone]  [place]: [address] [place], [place] [address] [phone]  [place]: [address]  [place], [place] [address] [phone]  Dr. Richard   Tuchman DPM, Dr. Zyrell Carmean DPM Dr. Richard Sikora DPM, Dr. M. Todd Hyatt DPM, Dr. Kathryn Egerton DPM 

## 2015-07-21 NOTE — Progress Notes (Signed)
Subjective:     Patient ID: Pamela Sims, female   DOB: 1951/08/15, 64 y.o.   MRN: [id number]  HPI patient presents stating I'm still just getting a lot of pain in my left foot that is mostly in the heel and making increasingly difficult for me to walk. Patient states that it's the bottom of the heel that's worse but that the back of the heel can start her side of the foot hurts but she also feels like she's walking differently and some pain on the outside of the right foot   Review of Systems     Objective:   Physical Exam Neurovascular status intact with inflammation around the left plantar fashion at the insertional point tendon the calcaneus with moderate discomfort posterior and lateral foot bilateral    Assessment:     Acute plantar fasciitis left of long-term nature with failure to respond to conservative care and other areas that are sore secondary to gait change    Plan:     Reviewed condition at great length and due to long-standing nature and failure to respond to numerous conservative care I've recommended endoscopic release of the fascial band left. Patient wants surgery and I explained to her explaining risk. Patient is willing to accept risk and at this time I went ahead and I allowed her to review alternative treatments and complications and then sign consent form for endoscopic release. I explained told recovery can take from 6 months to one year without long-term guarantees and I did dispense air fracture walker today with instructions on usage

## 2015-07-28 ENCOUNTER — Telehealth: Payer: Self-pay | Admitting: *Deleted

## 2015-07-28 DIAGNOSIS — M722 Plantar fascial fibromatosis: Secondary | ICD-10-CM | POA: Diagnosis not present

## 2015-07-28 NOTE — Telephone Encounter (Signed)
Pt's dtr, Rodena Piety states pt had surgery today, and they have questions.  I spoke with Rodena Piety and she asked if the pt could take the boot off, and how would she shower.  I told Rodena Piety the pt must remain in the boot at all times, but may open boot to rotate ankle and to ice if only resting and not walking or going to sleep.  I explained that pt could sit in a plastic chair, with the surgical leg and boot covered thoroughly and shower, to report if the dressing got wet and be careful with slipping.  Rodena Piety states understanding.

## 2015-08-07 ENCOUNTER — Ambulatory Visit (INDEPENDENT_AMBULATORY_CARE_PROVIDER_SITE_OTHER): Payer: BC Managed Care – PPO | Admitting: Podiatry

## 2015-08-07 DIAGNOSIS — M722 Plantar fascial fibromatosis: Secondary | ICD-10-CM

## 2015-08-07 DIAGNOSIS — Z9889 Other specified postprocedural states: Secondary | ICD-10-CM

## 2015-08-09 NOTE — Progress Notes (Signed)
Subjective:     Patient ID: Pamela Sims, female   DOB: 1951-06-14, 64 y.o.   MRN: [id number]  HPI patient states my left heel is remaining tender but it is doing very well after surgery with minimal swelling or pain with walking   Review of Systems     Objective:   Physical Exam Neurovascular status intact wound edges coapted well with minimal plantar discomfort upon palpation    Assessment:     Doing well post endoscopic surgery left    Plan:     Reapplied sterile dressing continue immobilization and reappoint

## 2015-08-14 ENCOUNTER — Ambulatory Visit (INDEPENDENT_AMBULATORY_CARE_PROVIDER_SITE_OTHER): Payer: BC Managed Care – PPO | Admitting: Podiatry

## 2015-08-14 DIAGNOSIS — M722 Plantar fascial fibromatosis: Secondary | ICD-10-CM

## 2015-08-14 DIAGNOSIS — Z9889 Other specified postprocedural states: Secondary | ICD-10-CM

## 2015-08-14 DIAGNOSIS — M205X9 Other deformities of toe(s) (acquired), unspecified foot: Secondary | ICD-10-CM

## 2015-08-14 NOTE — Patient Instructions (Signed)
Pre-Operative Instructions  Congratulations, you have decided to take an important step to improving your quality of life.  You can be assured that the doctors of [facility] will be with you every step of the way.  1. Plan to be at the surgery center/hospital at least 1 (one) hour prior to your scheduled time unless otherwise directed by the surgical center/hospital staff.  You must have a responsible adult accompany you, remain during the surgery and drive you home.  Make sure you have directions to the surgical center/hospital and know how to get there on time. 2. For hospital based surgery you will need to obtain a history and physical form from your family physician within 1 month prior to the date of surgery- we will give you a form for you primary physician.  3. We make every effort to accommodate the date you request for surgery.  There are however, times where surgery dates or times have to be moved.  We will contact you as soon as possible if a change in schedule is required.   4. No Aspirin/Ibuprofen for one week before surgery.  If you are on aspirin, any non-steroidal anti-inflammatory medications (Mobic, Aleve, Ibuprofen) you should stop taking it 7 days prior to your surgery.  You make take Tylenol  For pain prior to surgery.  5. Medications- If you are taking daily heart and blood pressure medications, seizure, reflux, allergy, asthma, anxiety, pain or diabetes medications, make sure the surgery center/hospital is aware before the day of surgery so they may notify you which medications to take or avoid the day of surgery. 6. No food or drink after midnight the night before surgery unless directed otherwise by surgical center/hospital staff. 7. No alcoholic beverages 24 hours prior to surgery.  No smoking 24 hours prior to or 24 hours after surgery. 8. Wear loose pants or shorts- loose enough to fit over bandages, boots, and casts. 9. No slip on shoes, sneakers are best. 10. Bring  your boot with you to the surgery center/hospital.  Also bring crutches or a walker if your physician has prescribed it for you.  If you do not have this equipment, it will be provided for you after surgery. 11. If you have not been contracted by the surgery center/hospital by the day before your surgery, call to confirm the date and time of your surgery. 12. Leave-time from work may vary depending on the type of surgery you have.  Appropriate arrangements should be made prior to surgery with your employer. 13. Prescriptions will be provided immediately following surgery by your doctor.  Have these filled as soon as possible after surgery and take the medication as directed. 14. Remove nail polish on the operative foot. 15. Wash the night before surgery.  The night before surgery wash the foot and leg well with the antibacterial soap provided and water paying special attention to beneath the toenails and in between the toes.  Rinse thoroughly with water and dry well with a towel.  Perform this wash unless told not to do so by your physician.  Enclosed: 1 Ice pack (please put in freezer the night before surgery)   1 Hibiclens skin cleaner   Pre-op Instructions  If you have any questions regarding the instructions, do not hesitate to call our office.  [place]: [address] [place], [place] [address] [phone]  [place]: [address] [place], [place] [address] [phone]  [place]: [address]  [place],  [address] [phone]  Dr. Richard   Tuchman DPM, Dr. Lateia Fraser DPM Dr. Richard Sikora DPM, Dr. M. Todd Hyatt DPM, Dr. Kathryn Egerton DPM 

## 2015-08-15 NOTE — Progress Notes (Signed)
Subjective:     Patient ID: Pamela Sims, female   DOB: 31-Jan-1951, 64 y.o.   MRN: [id number]  HPI patient states that her left heel is doing very well with minimal discomfort and she wants to get her right big toe joint fixed while the left is still healing   Review of Systems     Objective:   Physical Exam Neurovascular status intact negative Homan sign was noted with well-healing surgical site left medial and lateral heel with stitches coapted well and minimal plantar discomfort and is noted to have severe arthritis with minimal motion right first MPJ with large dorsal spur formation and pain within the joint    Assessment:     Doing well post endoscopic surgery left with severe arthritis and pain of the first MPJ right foot    Plan:     H&P and conditions reviewed stitches removed left and sterile dressing reapplied. May increase activity levels and at this time discussed correction of the right first MPJ and she wants this done and I allowed her to read consent form reviewing alternative treatments and complications as listed. She understands that the joint will be destroyed with this procedure but that with implants she should be pain free with motion. Patient wants surgery understanding risk and signs consent form and is given all preoperative instructions for surgery on the right which will be done in the next 2 weeks. She is encouraged to call with any questions

## 2015-08-24 ENCOUNTER — Encounter: Payer: Self-pay | Admitting: Internal Medicine

## 2015-08-31 ENCOUNTER — Telehealth: Payer: Self-pay | Admitting: *Deleted

## 2015-08-31 NOTE — Telephone Encounter (Signed)
Pt states had EPF on left foot 1 month ago, now is swollen in the arch and suture line, does he want to see her before surgery tomorrow.  Dr. Paulla Dolly states have pt remind him to check the right foot before surgery tomorrow.  Informed pt.

## 2015-09-01 ENCOUNTER — Encounter: Payer: Self-pay | Admitting: Podiatry

## 2015-09-01 DIAGNOSIS — M2021 Hallux rigidus, right foot: Secondary | ICD-10-CM | POA: Diagnosis not present

## 2015-09-02 ENCOUNTER — Telehealth: Payer: Self-pay | Admitting: *Deleted

## 2015-09-02 NOTE — Telephone Encounter (Signed)
Pt states she took the plastic bubble out of the boot because it was bothering her leg, and the strap across the front of her foot hurt the surgery site.  I told the pt not to dismantle the boot further, but could loosen the front strap.  I told the pt not to be weightbearing more than 5 minutes/hour, and to either elevate the foot or have it level, sleep and walk in the boot, may remove and rotate ankle if going to rest and then must put boot back on.  Pt states understanding.

## 2015-09-11 ENCOUNTER — Ambulatory Visit (INDEPENDENT_AMBULATORY_CARE_PROVIDER_SITE_OTHER): Payer: BC Managed Care – PPO | Admitting: Podiatry

## 2015-09-11 ENCOUNTER — Ambulatory Visit: Payer: Self-pay

## 2015-09-11 DIAGNOSIS — M205X9 Other deformities of toe(s) (acquired), unspecified foot: Secondary | ICD-10-CM

## 2015-09-11 DIAGNOSIS — Z9889 Other specified postprocedural states: Secondary | ICD-10-CM

## 2015-09-13 ENCOUNTER — Telehealth: Payer: Self-pay | Admitting: Sports Medicine

## 2015-09-13 DIAGNOSIS — L03119 Cellulitis of unspecified part of limb: Secondary | ICD-10-CM

## 2015-09-13 DIAGNOSIS — L02619 Cutaneous abscess of unspecified foot: Secondary | ICD-10-CM

## 2015-09-13 MED ORDER — CEPHALEXIN 500 MG PO CAPS: 500.0000 mg | ORAL_CAPSULE | Freq: Three times a day (TID) | ORAL | Status: DC

## 2015-09-13 NOTE — Telephone Encounter (Signed)
Spoke with patient regarding swelling and redness of foot. Advised patient to ice and elevate. Also sent to pharmacy Keflex to start today. Advised patient that if redness and swelling does not improve after a few doses of antibiotic to go to local ER or Urgent care. Patient denies nausea, vomiting, fever, chills, or any other constitutional symptoms. Encouraged patient to call back if she has any other issues or concerns. -Dr. Cannon Kettle

## 2015-09-20 NOTE — Progress Notes (Signed)
Subjective:     Patient ID: Pamela Sims, female   DOB: Feb 29, 1951, 64 y.o.   MRN: [id number]  HPI patient states I'm doing real well with my right foot with good alignment noted and minimal discomfort   Review of Systems     Objective:   Physical Exam  neurovascular status intact with good range of motion first MPJ right with wound edges well coapted and negative Homans sign noted    Assessment:      doing well with implant right    Plan:      x-rays reviewed and instructed on continued compression elevation and gradual increase in activity level. Reappoint to recheck and earlier if any issues should occur

## 2015-09-24 ENCOUNTER — Encounter: Payer: Self-pay | Admitting: Podiatry

## 2015-09-24 NOTE — Progress Notes (Signed)
DOS 07-28-15  EPF left  Rx'd Vicodin 10/325 #25

## 2015-09-24 NOTE — Progress Notes (Signed)
DOS 09/01/2015 Joint implant procedure 1st MPT right foot.

## 2015-09-30 ENCOUNTER — Encounter: Payer: Self-pay | Admitting: Podiatry

## 2015-09-30 ENCOUNTER — Ambulatory Visit (INDEPENDENT_AMBULATORY_CARE_PROVIDER_SITE_OTHER): Payer: BC Managed Care – PPO

## 2015-09-30 ENCOUNTER — Ambulatory Visit (INDEPENDENT_AMBULATORY_CARE_PROVIDER_SITE_OTHER): Payer: BC Managed Care – PPO | Admitting: Podiatry

## 2015-09-30 VITALS — BP 152/90 | HR 74 | Resp 16

## 2015-09-30 DIAGNOSIS — M205X9 Other deformities of toe(s) (acquired), unspecified foot: Secondary | ICD-10-CM

## 2015-09-30 DIAGNOSIS — Z9889 Other specified postprocedural states: Secondary | ICD-10-CM

## 2015-09-30 DIAGNOSIS — M722 Plantar fascial fibromatosis: Secondary | ICD-10-CM

## 2015-09-30 NOTE — Progress Notes (Signed)
Subjective:     Patient ID: Pamela Sims, female   DOB: 1950/12/19, 65 y.o.   MRN: [id number]  HPI patient states I'm doing well with my right foot but having quite a bit of arch pain left that it feels different than the typical plantar fasciitis I had this done extremely well with the surgery   Review of Systems     Objective:   Physical Exam Neurovascular status intact negative Homans sign noted with well-healed surgical site right first metatarsal with wound edges coapted well and good alignment and good range of motion. Discomfort in the left arch that's worse after periods of sitting and sleeping    Assessment:     Doing well post implant procedure right with arch pain left    Plan:     Reviewed x-rays of the right and begin gradual increase in activities and dispensed anklet for compression and continue elevation and for the left dispensed night splint with instructions on usage to stretch the arch and foot. Reappoint to recheck

## 2015-10-01 ENCOUNTER — Other Ambulatory Visit: Payer: BC Managed Care – PPO

## 2015-10-17 ENCOUNTER — Other Ambulatory Visit: Payer: Self-pay | Admitting: Emergency Medicine

## 2015-10-28 ENCOUNTER — Ambulatory Visit (INDEPENDENT_AMBULATORY_CARE_PROVIDER_SITE_OTHER): Payer: BC Managed Care – PPO

## 2015-10-28 ENCOUNTER — Ambulatory Visit (INDEPENDENT_AMBULATORY_CARE_PROVIDER_SITE_OTHER): Payer: BC Managed Care – PPO | Admitting: Podiatry

## 2015-10-28 DIAGNOSIS — M205X9 Other deformities of toe(s) (acquired), unspecified foot: Secondary | ICD-10-CM

## 2015-10-28 DIAGNOSIS — Z9889 Other specified postprocedural states: Secondary | ICD-10-CM

## 2015-10-28 DIAGNOSIS — M722 Plantar fascial fibromatosis: Secondary | ICD-10-CM

## 2015-10-28 NOTE — Progress Notes (Signed)
Subjective:     Patient ID: Pamela Sims, female   DOB: January 12, 1951, 65 y.o.   MRN: [id number]  HPI patient presents stating I am doing better with my right foot and the left arch seems to be improved with occasional discomfort still noted and I was just concerned because there was swelling   Review of Systems     Objective:   Physical Exam Neurovascular status intact muscle strength adequate with good range of motion first MPJ with moderate swelling still noted and mild discomfort in the left arch    Assessment:     Patient continues to improve from conditions bilateral    Plan:     X-rays of both feet reviewed and at this time allow patient to return to normal shoe gear with arch supports and do stretching exercises for the left foot. Patient will be seen back as needed for condition but at this point seems to be doing very well

## 2015-10-29 ENCOUNTER — Other Ambulatory Visit: Payer: BC Managed Care – PPO

## 2015-11-11 ENCOUNTER — Other Ambulatory Visit: Payer: Self-pay

## 2015-11-11 MED ORDER — CHLORTHALIDONE 25 MG PO TABS: 25.0000 mg | ORAL_TABLET | Freq: Every day | ORAL | Status: DC

## 2015-12-09 ENCOUNTER — Ambulatory Visit: Payer: BC Managed Care – PPO | Admitting: Podiatry

## 2015-12-10 ENCOUNTER — Ambulatory Visit (INDEPENDENT_AMBULATORY_CARE_PROVIDER_SITE_OTHER): Payer: BC Managed Care – PPO

## 2015-12-10 ENCOUNTER — Ambulatory Visit (INDEPENDENT_AMBULATORY_CARE_PROVIDER_SITE_OTHER): Payer: BC Managed Care – PPO | Admitting: Podiatry

## 2015-12-10 DIAGNOSIS — Z9889 Other specified postprocedural states: Secondary | ICD-10-CM

## 2015-12-10 DIAGNOSIS — M205X9 Other deformities of toe(s) (acquired), unspecified foot: Secondary | ICD-10-CM

## 2015-12-10 DIAGNOSIS — M779 Enthesopathy, unspecified: Secondary | ICD-10-CM | POA: Diagnosis not present

## 2015-12-10 MED ORDER — TRIAMCINOLONE ACETONIDE 10 MG/ML IJ SUSP
10.0000 mg | Freq: Once | INTRAMUSCULAR | Status: AC
  Administered 2015-12-10: 10 mg

## 2015-12-10 NOTE — Progress Notes (Signed)
Subjective:     Patient ID: Pamela Sims, female   DOB: [birth year], 65 y.o.   MRN: [id number]  HPI patient states and doing real well with pain in the outside of my left foot that's relatively persistent in nature but doing really good with the right foot   Review of Systems     Objective:   Physical Exam Neurovascular status intact muscle strength adequate range of motion was within normal limits with patient noted to have implants in good place right with good alignment and good range of motion and pain in the peroneal complex left lateral foot    Assessment:     Doing well post implant right first MPJ and tendinitis left lateral foot    Plan:     H&P x-rays reviewed with patient. I went ahead today and I injected the left lateral foot 3 Milligan, 5 mill grams Xylocaine and reviewed x-rays right allowing her to return to normal activities. Reappoint to recheck  X-ray report indicated that there is good healing of the first metatarsal right with implant in place and good alignment noted. Patient's left lateral foot does not show signs of fracture or other pathology

## 2016-01-13 ENCOUNTER — Telehealth: Payer: Self-pay | Admitting: Lab

## 2016-01-13 NOTE — Telephone Encounter (Signed)
Left voicemail for pt. To call office to r/s appt. °

## 2016-01-27 ENCOUNTER — Telehealth: Payer: Self-pay | Admitting: Podiatry

## 2016-01-27 NOTE — Telephone Encounter (Signed)
Insurance is rejected for [employer] /Do you have a new insurance?

## 2016-02-04 ENCOUNTER — Ambulatory Visit: Payer: BC Managed Care – PPO | Admitting: Podiatry

## 2016-02-05 ENCOUNTER — Ambulatory Visit (INDEPENDENT_AMBULATORY_CARE_PROVIDER_SITE_OTHER): Payer: Self-pay | Admitting: Podiatry

## 2016-02-05 ENCOUNTER — Ambulatory Visit (INDEPENDENT_AMBULATORY_CARE_PROVIDER_SITE_OTHER): Payer: BC Managed Care – PPO

## 2016-02-05 ENCOUNTER — Encounter: Payer: Self-pay | Admitting: Podiatry

## 2016-02-05 VITALS — BP 151/92 | HR 72 | Resp 12

## 2016-02-05 DIAGNOSIS — Z9889 Other specified postprocedural states: Secondary | ICD-10-CM

## 2016-02-05 DIAGNOSIS — M79672 Pain in left foot: Secondary | ICD-10-CM

## 2016-02-05 DIAGNOSIS — M779 Enthesopathy, unspecified: Secondary | ICD-10-CM

## 2016-02-05 DIAGNOSIS — M205X9 Other deformities of toe(s) (acquired), unspecified foot: Secondary | ICD-10-CM

## 2016-02-05 NOTE — Progress Notes (Signed)
Subjective:     Patient ID: Pamela Sims, female   DOB: 27-Aug-1951, 66 y.o.   MRN: [id number]  HPI patient presents concerned because she feels like her left toe may be a little bit smaller than it was and it seems like the ends of her toes bother her and states she's having no pain in her right or left foot   Review of Systems     Objective:   Physical Exam Neurovascular status intact negative Homans sign noted and well-healed surgical site right first metatarsal left heel with possibility for slight retraction of the right hallux but minimal in its nature with no distal keratotic lesions on the lesser digits    Assessment:     Patient may be developing mild distal discomfort on the lesser digits right but I do believe it's localized and I don't think that this will be pathological long-term    Plan:     Reviewed x-ray of right foot and advised that she may need to get shoes half a size bigger or wear more cushioned socks. Patient will be seen back as needed but I do not see any big issues at this point  X-rays indicate that the implant is in place with good alignment noted with no signs of pathology

## 2016-02-19 ENCOUNTER — Other Ambulatory Visit: Payer: Self-pay | Admitting: Physician Assistant

## 2016-02-19 MED ORDER — VALSARTAN 320 MG PO TABS: ORAL_TABLET | ORAL | Status: DC

## 2016-02-19 NOTE — Telephone Encounter (Signed)
Pt will be in for an office visit.  She has not been in due to not having insurance.

## 2016-03-30 ENCOUNTER — Ambulatory Visit (INDEPENDENT_AMBULATORY_CARE_PROVIDER_SITE_OTHER): Payer: BC Managed Care – PPO | Admitting: Physician Assistant

## 2016-03-30 VITALS — BP 120/78 | HR 91 | Temp 98.0°F | Resp 18 | Ht 67.0 in | Wt 248.0 lb

## 2016-03-30 DIAGNOSIS — I10 Essential (primary) hypertension: Secondary | ICD-10-CM

## 2016-03-30 LAB — BASIC METABOLIC PANEL
BUN: 12 mg/dL (ref 7–25)
CO2: 31 mmol/L (ref 20–31)
Calcium: 9.1 mg/dL (ref 8.6–10.4)
Chloride: 105 mmol/L (ref 98–110)
Creat: 0.98 mg/dL (ref 0.50–0.99)
Glucose, Bld: 93 mg/dL (ref 65–99)
Potassium: 4.2 mmol/L (ref 3.5–5.3)
Sodium: 144 mmol/L (ref 135–146)

## 2016-03-30 MED ORDER — CHLORTHALIDONE 25 MG PO TABS: 25.0000 mg | ORAL_TABLET | Freq: Every day | ORAL | Status: DC

## 2016-03-30 MED ORDER — AMLODIPINE BESYLATE 10 MG PO TABS: 10.0000 mg | ORAL_TABLET | Freq: Every day | ORAL | Status: DC

## 2016-03-30 NOTE — Patient Instructions (Signed)
     IF you received an x-ray today, you will receive an invoice from [facility]. Please contact [facility] at [phone] with questions or concerns regarding your invoice.   IF you received labwork today, you will receive an invoice from [facility]. Please contact [facility] at [phone] with questions or concerns regarding your invoice.   Our billing staff will not be able to assist you with questions regarding bills from these companies.  You will be contacted with the lab results as soon as they are available. The fastest way to get your results is to activate your My Chart account. Instructions are located on the last page of this paperwork. If you have not heard from us regarding the results in 2 weeks, please contact this office.      

## 2016-03-30 NOTE — Progress Notes (Signed)
   Pamela Sims  MRN: [id number] DOB: [birth year]  Subjective:  Pt presents to clinic for mediation change - She has been on diovan for her BP and she has been seeing a GI specialist (Dr Benson Norway) and he thinks that it may be part of her chronic cough and he requested that it be changed.  Her BP has been ok controlled.  She is on daily Adderall and lexapro.  She only eats one meal a day.  BP at home - sometimes she checks at home - 135-165/80s  colonoscopy - 1 precancer polyp Endoscopy - hiatal hernia  Patient Active Problem List   Diagnosis Date Noted  . Neck pain 02/06/2014  . DYSPNEA ON EXERTION 06/26/2009  . ALLERGIC RHINITIS 06/12/2009  . HEADACHE, CHRONIC 06/12/2009  . COUGH, CHRONIC 06/12/2009  . HYPERLIPIDEMIA 06/11/2009  . Essential hypertension 06/11/2009    Current Outpatient Prescriptions on File Prior to Visit  Medication Sig Dispense Refill  . diazepam (VALIUM) 5 MG tablet 2.5 mg.    . escitalopram (LEXAPRO) 20 MG tablet Take 20 mg by mouth daily.    . valsartan (DIOVAN) 320 MG tablet Take 1 tablet (320 mg) by mouth daily 30 tablet 0   No current facility-administered medications on file prior to visit.    Allergies  Allergen Reactions  . Adhesive [Tape]     BANDAID     Review of Systems  Constitutional: Negative for fever and chills.  Respiratory: Positive for cough. Negative for shortness of breath.   Cardiovascular: Negative for chest pain, palpitations and leg swelling.   Objective:  BP 120/78 mmHg  Pulse 91  Temp(Src) 98 F (36.7 C) (Oral)  Resp 18  Ht 5\' 7"  (1.702 m)  Wt 248 lb (112.492 kg)  BMI 38.83 kg/m2  SpO2 97%  Physical Exam  Constitutional: She is oriented to person, place, and time and well-developed, well-nourished, and in no distress.  HENT:  Head: Normocephalic and atraumatic.  Right Ear: Hearing and external ear normal.  Left Ear: Hearing and external ear normal.  Eyes: Conjunctivae are normal.  Neck: Normal range of motion.    Cardiovascular: Normal rate, regular rhythm and normal heart sounds.   No murmur heard. Pulmonary/Chest: Effort normal and breath sounds normal.  Musculoskeletal:       Right lower leg: She exhibits no edema.       Left lower leg: She exhibits no edema.  Neurological: She is alert and oriented to person, place, and time. Gait normal.  Skin: Skin is warm and dry.  Psychiatric: Mood, memory, affect and judgment normal.  Vitals reviewed.   Assessment and Plan :  Essential hypertension - Plan: amLODipine (NORVASC) 10 MG tablet, Basic metabolic panel, chlorthalidone (HYGROTON) 25 MG tablet   D/c diovan and start Norvasc.  She has had h/o low potassium so we will check her BMET today.  She will continue checking her BP at home - she will recheck in a month.  Windell Hummingbird PA-C  [facility] 03/30/2016 2:49 PM

## 2016-04-01 ENCOUNTER — Encounter: Payer: Self-pay | Admitting: Physician Assistant

## 2016-04-01 DIAGNOSIS — K219 Gastro-esophageal reflux disease without esophagitis: Secondary | ICD-10-CM

## 2016-04-01 HISTORY — DX: Gastro-esophageal reflux disease without esophagitis: K21.9

## 2016-05-02 ENCOUNTER — Other Ambulatory Visit: Payer: Self-pay | Admitting: Gastroenterology

## 2016-05-02 ENCOUNTER — Telehealth: Payer: Self-pay

## 2016-05-02 DIAGNOSIS — R1084 Generalized abdominal pain: Secondary | ICD-10-CM

## 2016-05-02 NOTE — Telephone Encounter (Signed)
PATIENT STATES SHE SAW SARAH WEBER ABOUT A MONTH AGO TO GET MEDICATION FOR HER BLOOD PRESSURE. SHE HAS NEVER GOTTEN HER LAB RESULTS. SHE ALSO HAD HER URINE CHECKED. BEST PHONE [phone] (CELL)  PHARMACY CHOICE IS [facility] ON [address] IN [place], [place].  [place]

## 2016-05-02 NOTE — Telephone Encounter (Signed)
Spoke with pt, advised labs were normal.

## 2016-05-06 ENCOUNTER — Ambulatory Visit
Admission: RE | Admit: 2016-05-06 | Discharge: 2016-05-06 | Disposition: A | Discharge status: Home / Self Care | Payer: BC Managed Care – PPO | Source: Ambulatory Visit | Attending: Gastroenterology | Admitting: Gastroenterology

## 2016-05-06 DIAGNOSIS — R1084 Generalized abdominal pain: Secondary | ICD-10-CM

## 2016-05-06 MED ORDER — IOPAMIDOL (ISOVUE-300) INJECTION 61%
125.0000 mL | Freq: Once | INTRAVENOUS | Status: AC | PRN
  Administered 2016-05-06: 125 mL via INTRAVENOUS

## 2016-05-27 ENCOUNTER — Other Ambulatory Visit: Payer: Self-pay | Admitting: Physician Assistant

## 2016-05-27 DIAGNOSIS — I10 Essential (primary) hypertension: Secondary | ICD-10-CM

## 2016-11-07 DIAGNOSIS — N390 Urinary tract infection, site not specified: Secondary | ICD-10-CM | POA: Insufficient documentation

## 2016-11-07 DIAGNOSIS — R3129 Other microscopic hematuria: Secondary | ICD-10-CM | POA: Insufficient documentation

## 2016-11-07 HISTORY — DX: Other microscopic hematuria: R31.29

## 2016-11-07 HISTORY — DX: Urinary tract infection, site not specified: N39.0

## 2016-11-29 ENCOUNTER — Other Ambulatory Visit: Payer: Self-pay | Admitting: Family Medicine

## 2016-11-29 DIAGNOSIS — Z1231 Encounter for screening mammogram for malignant neoplasm of breast: Secondary | ICD-10-CM

## 2016-12-12 ENCOUNTER — Ambulatory Visit: Payer: Self-pay

## 2016-12-28 ENCOUNTER — Ambulatory Visit: Payer: Self-pay

## 2017-01-11 ENCOUNTER — Ambulatory Visit
Admission: RE | Admit: 2017-01-11 | Discharge: 2017-01-11 | Disposition: A | Discharge status: Home / Self Care | Payer: BC Managed Care – PPO | Source: Ambulatory Visit | Attending: Family Medicine | Admitting: Family Medicine

## 2017-01-11 DIAGNOSIS — Z1231 Encounter for screening mammogram for malignant neoplasm of breast: Secondary | ICD-10-CM

## 2017-11-07 ENCOUNTER — Emergency Department (HOSPITAL_BASED_OUTPATIENT_CLINIC_OR_DEPARTMENT_OTHER)
Admission: EM | Admit: 2017-11-07 | Discharge: 2017-11-07 | Disposition: A | Discharge status: Home / Self Care | Payer: Medicare Other | Attending: Emergency Medicine | Admitting: Emergency Medicine

## 2017-11-07 ENCOUNTER — Emergency Department (HOSPITAL_BASED_OUTPATIENT_CLINIC_OR_DEPARTMENT_OTHER): Payer: Medicare Other

## 2017-11-07 ENCOUNTER — Other Ambulatory Visit: Payer: Self-pay

## 2017-11-07 ENCOUNTER — Encounter (HOSPITAL_BASED_OUTPATIENT_CLINIC_OR_DEPARTMENT_OTHER): Payer: Self-pay | Admitting: Emergency Medicine

## 2017-11-07 DIAGNOSIS — Z87891 Personal history of nicotine dependence: Secondary | ICD-10-CM | POA: Diagnosis not present

## 2017-11-07 DIAGNOSIS — Z79899 Other long term (current) drug therapy: Secondary | ICD-10-CM | POA: Insufficient documentation

## 2017-11-07 DIAGNOSIS — K122 Cellulitis and abscess of mouth: Secondary | ICD-10-CM | POA: Diagnosis not present

## 2017-11-07 DIAGNOSIS — J45909 Unspecified asthma, uncomplicated: Secondary | ICD-10-CM | POA: Insufficient documentation

## 2017-11-07 DIAGNOSIS — I10 Essential (primary) hypertension: Secondary | ICD-10-CM | POA: Diagnosis not present

## 2017-11-07 DIAGNOSIS — Z96653 Presence of artificial knee joint, bilateral: Secondary | ICD-10-CM | POA: Insufficient documentation

## 2017-11-07 DIAGNOSIS — R0602 Shortness of breath: Secondary | ICD-10-CM | POA: Diagnosis present

## 2017-11-07 MED ORDER — RACEPINEPHRINE HCL 2.25 % IN NEBU
0.5000 mL | INHALATION_SOLUTION | Freq: Once | RESPIRATORY_TRACT | Status: AC
  Administered 2017-11-07: 0.5 mL via RESPIRATORY_TRACT
  Filled 2017-11-07: qty 0.5

## 2017-11-07 MED ORDER — DEXAMETHASONE SODIUM PHOSPHATE 10 MG/ML IJ SOLN
10.0000 mg | Freq: Once | INTRAMUSCULAR | Status: AC
  Administered 2017-11-07: 10 mg via INTRAVENOUS
  Filled 2017-11-07: qty 1

## 2017-11-07 NOTE — ED Triage Notes (Signed)
Patient states that she woke up this am with a feeling like her throat is closing up - Also having SOB and upper airway congestion
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
## 2017-11-07 NOTE — ED Notes (Signed)
Patient transported to X-ray 

## 2017-11-07 NOTE — ED Notes (Signed)
Family at bedside. 

## 2017-12-26 ENCOUNTER — Other Ambulatory Visit: Payer: Self-pay | Admitting: Cardiology

## 2017-12-26 DIAGNOSIS — Z139 Encounter for screening, unspecified: Secondary | ICD-10-CM

## 2018-01-23 ENCOUNTER — Ambulatory Visit
Admission: RE | Admit: 2018-01-23 | Discharge: 2018-01-23 | Disposition: A | Discharge status: Home / Self Care | Payer: Medicare Other | Source: Ambulatory Visit | Attending: Cardiology | Admitting: Cardiology

## 2018-01-23 DIAGNOSIS — Z139 Encounter for screening, unspecified: Secondary | ICD-10-CM

## 2018-02-05 ENCOUNTER — Encounter: Payer: Self-pay | Admitting: Podiatry

## 2018-02-05 ENCOUNTER — Other Ambulatory Visit: Payer: Self-pay

## 2018-02-05 ENCOUNTER — Ambulatory Visit (INDEPENDENT_AMBULATORY_CARE_PROVIDER_SITE_OTHER): Payer: Medicare Other | Admitting: Podiatry

## 2018-02-05 DIAGNOSIS — M7731 Calcaneal spur, right foot: Secondary | ICD-10-CM | POA: Diagnosis not present

## 2018-02-06 DIAGNOSIS — G4733 Obstructive sleep apnea (adult) (pediatric): Secondary | ICD-10-CM

## 2018-02-06 HISTORY — DX: Obstructive sleep apnea (adult) (pediatric): G47.33
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
## 2018-05-07 ENCOUNTER — Encounter: Payer: Self-pay | Admitting: Podiatry

## 2018-05-07 ENCOUNTER — Ambulatory Visit (INDEPENDENT_AMBULATORY_CARE_PROVIDER_SITE_OTHER): Payer: Medicare Other | Admitting: Podiatry

## 2018-05-07 DIAGNOSIS — M7731 Calcaneal spur, right foot: Secondary | ICD-10-CM

## 2018-05-07 DIAGNOSIS — M7661 Achilles tendinitis, right leg: Secondary | ICD-10-CM | POA: Diagnosis not present

## 2018-05-07 NOTE — Patient Instructions (Signed)
Pre-Operative Instructions  Congratulations, you have decided to take an important step towards improving your quality of life.  You can be assured that the doctors and staff at [facility] will be with you every step of the way.  Here are some important things you should know:  1. Plan to be at the surgery center/hospital at least 1 (one) hour prior to your scheduled time, unless otherwise directed by the surgical center/hospital staff.  You must have a responsible adult accompany you, remain during the surgery and drive you home.  Make sure you have directions to the surgical center/hospital to ensure you arrive on time. 2. If you are having surgery at [facility] or [facility] hospitals, you will need a copy of your medical history and physical form from your family physician within one month prior to the date of surgery. We will give you a form for your primary physician to complete.  3. We make every effort to accommodate the date you request for surgery.  However, there are times where surgery dates or times have to be moved.  We will contact you as soon as possible if a change in schedule is required.   4. No aspirin/ibuprofen for one week before surgery.  If you are on aspirin, any non-steroidal anti-inflammatory medications (Mobic, Aleve, Ibuprofen) should not be taken seven (7) days prior to your surgery.  You make take Tylenol for pain prior to surgery.  5. Medications - If you are taking daily heart and blood pressure medications, seizure, reflux, allergy, asthma, anxiety, pain or diabetes medications, make sure you notify the surgery center/hospital before the day of surgery so they can tell you which medications you should take or avoid the day of surgery. 6. No food or drink after midnight the night before surgery unless directed otherwise by surgical center/hospital staff. 7. No alcoholic beverages 24-hours prior to surgery.  No smoking 24-hours prior or 24-hours after  surgery. 8. Wear loose pants or shorts. They should be loose enough to fit over bandages, boots, and casts. 9. Don't wear slip-on shoes. Sneakers are preferred. 10. Bring your boot with you to the surgery center/hospital.  Also bring crutches or a walker if your physician has prescribed it for you.  If you do not have this equipment, it will be provided for you after surgery. 11. If you have not been contacted by the surgery center/hospital by the day before your surgery, call to confirm the date and time of your surgery. 12. Leave-time from work may vary depending on the type of surgery you have.  Appropriate arrangements should be made prior to surgery with your employer. 13. Prescriptions will be provided immediately following surgery by your doctor.  Fill these as soon as possible after surgery and take the medication as directed. Pain medications will not be refilled on weekends and must be approved by the doctor. 14. Remove nail polish on the operative foot and avoid getting pedicures prior to surgery. 15. Wash the night before surgery.  The night before surgery wash the foot and leg well with water and the antibacterial soap provided. Be sure to pay special attention to beneath the toenails and in between the toes.  Wash for at least three (3) minutes. Rinse thoroughly with water and dry well with a towel.  Perform this wash unless told not to do so by your physician.  Enclosed: 1 Ice pack (please put in freezer the night before surgery)   1 Hibiclens skin cleaner     Pre-op instructions  If you have any questions regarding the instructions, please do not hesitate to call our office.  [place]: [address], [place], [place] [address] -- [phone]  [place]: [address] [place], [place] [address] -- [phone]  [place]: [address]  [place], [place] [address] -- [phone]  [place]: [address], [address], [place], [place] [address] -- [phone]  Website:  [email or web address] 
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
## 2018-05-21 DIAGNOSIS — M48062 Spinal stenosis, lumbar region with neurogenic claudication: Secondary | ICD-10-CM | POA: Insufficient documentation

## 2018-05-21 HISTORY — DX: Spinal stenosis, lumbar region with neurogenic claudication: M48.062

## 2018-05-22 ENCOUNTER — Telehealth: Payer: Self-pay | Admitting: Podiatry

## 2018-05-22 NOTE — Telephone Encounter (Signed)
Hello Ms. Pamela Sims, if you could please give me a call at [phone], I would greatly appreciate it. I have surgery scheduled for 10 October and I need to talk to you about some extenuating circumstances where I might have to cancel that surgery. So if you could please give me a call back at [phone] as soon as possible I would greatly appreciate it. Thank you. Bye.

## 2018-05-23 ENCOUNTER — Telehealth: Payer: Self-pay | Admitting: *Deleted

## 2018-05-23 NOTE — Telephone Encounter (Signed)
"  I'm not doing well.  I have stenosis in my back.  I may have to have surgery on my back.  So I think I need to cancel my surgery because I don't want to fall.  I am wearing a brace.  I hate to keep putting this off.

## 2018-10-08 DIAGNOSIS — K219 Gastro-esophageal reflux disease without esophagitis: Secondary | ICD-10-CM

## 2018-10-08 HISTORY — DX: Gastro-esophageal reflux disease without esophagitis: K21.9

## 2018-11-08 ENCOUNTER — Ambulatory Visit: Payer: Self-pay | Admitting: Orthopedic Surgery

## 2018-11-15 NOTE — Pre-Procedure Instructions (Signed)
Pamela Sims  11/15/2018      [id number] - [place], [place] - [address] AT [place] [address] [place] [address] Phone: [phone] Fax: [phone]    Your procedure is scheduled on Thursday February 27th.  Report to [facility] Admitting at 5:30 A.M.  Call this number if you have problems the morning of surgery:  [phone]   Remember:  Do not eat or drink after midnight.    Take these medicines the morning of surgery with A SIP OF WATER  amLODipine (NORVASC)  DULoxetine (CYMBALTA)  Follow your surgeon's instructions on when to stop Asprin.  If no instructions were given by your surgeon then you will need to call the office to get those instructions.    7 days prior to surgery STOP taking any Aspirin(unless otherwise instructed by your surgeon), Aleve, Naproxen, Ibuprofen, Motrin, Advil, Goody's, BC's, all herbal medications, fish oil, and all vitamins     Do not wear jewelry, make-up or nail polish.  Do not wear lotions, powders, or perfumes, or deodorant.  Do not shave 48 hours prior to surgery.  Do not bring valuables to the hospital.  [facility] is not responsible for any belongings or valuables.  Contacts, dentures or bridgework may not be worn into surgery.  Leave your suitcase in the car.  After surgery it may be brought to your room.  For patients admitted to the hospital, discharge time will be determined by your treatment team.  Patients discharged the day of surgery will not be allowed to drive home.   [facility]- Preparing For Surgery  Before surgery, you can play an important role. Because skin is not sterile, your skin needs to be as free of germs as possible. You can reduce the number of germs on your skin by washing with CHG (chlorahexidine gluconate) Soap before surgery.  CHG is an antiseptic cleaner which kills germs and bonds with the skin to continue killing germs even  after washing.    Oral Hygiene is also important to reduce your risk of infection.  Remember - BRUSH YOUR TEETH THE MORNING OF SURGERY WITH YOUR REGULAR TOOTHPASTE  Please do not use if you have an allergy to CHG or antibacterial soaps. If your skin becomes reddened/irritated stop using the CHG.  Do not shave (including legs and underarms) for at least 48 hours prior to first CHG shower. It is OK to shave your face.  Please follow these instructions carefully.   1. Shower the NIGHT BEFORE SURGERY and the MORNING OF SURGERY with CHG.   2. If you chose to wash your hair, wash your hair first as usual with your normal shampoo.  3. After you shampoo, rinse your hair and body thoroughly to remove the shampoo.  4. Use CHG as you would any other liquid soap. You can apply CHG directly to the skin and wash gently with a scrungie or a clean washcloth.   5. Apply the CHG Soap to your body ONLY FROM THE NECK DOWN.  Do not use on open wounds or open sores. Avoid contact with your eyes, ears, mouth and genitals (private parts). Wash Face and genitals (private parts)  with your normal soap.  6. Wash thoroughly, paying special attention to the area where your surgery will be performed.  7. Thoroughly rinse your body with warm water from the neck down.  8. DO NOT shower/wash with your  normal soap after using and rinsing off the CHG Soap.  9. Pat yourself dry with a CLEAN TOWEL.  10. Wear CLEAN PAJAMAS to bed the night before surgery, wear comfortable clothes the morning of surgery  11. Place CLEAN SHEETS on your bed the night of your first shower and DO NOT SLEEP WITH PETS.    Day of Surgery: Shower as stated above. Do not apply any deodorants/lotions.  Please wear clean clothes to the hospital/surgery center.   Remember to brush your teeth WITH YOUR REGULAR TOOTHPASTE.   Please read over the following fact sheets that you were given.

## 2018-11-16 ENCOUNTER — Other Ambulatory Visit: Payer: Self-pay

## 2018-11-16 ENCOUNTER — Ambulatory Visit: Payer: Self-pay | Admitting: Orthopedic Surgery

## 2018-11-16 ENCOUNTER — Encounter (HOSPITAL_COMMUNITY)
Admission: RE | Admit: 2018-11-16 | Discharge: 2018-11-16 | Disposition: A | Discharge status: Home / Self Care | Payer: Medicare Other | Source: Ambulatory Visit | Attending: Orthopedic Surgery | Admitting: Orthopedic Surgery

## 2018-11-16 ENCOUNTER — Encounter (HOSPITAL_COMMUNITY): Payer: Self-pay

## 2018-11-16 ENCOUNTER — Ambulatory Visit (HOSPITAL_COMMUNITY)
Admission: RE | Admit: 2018-11-16 | Discharge: 2018-11-16 | Disposition: A | Discharge status: Home / Self Care | Payer: Medicare Other | Source: Ambulatory Visit | Attending: Orthopedic Surgery | Admitting: Orthopedic Surgery

## 2018-11-16 DIAGNOSIS — Z01818 Encounter for other preprocedural examination: Secondary | ICD-10-CM

## 2018-11-16 LAB — BASIC METABOLIC PANEL
Anion gap: 8 (ref 5–15)
BUN: 17 mg/dL (ref 8–23)
CO2: 24 mmol/L (ref 22–32)
Calcium: 9.1 mg/dL (ref 8.9–10.3)
Chloride: 106 mmol/L (ref 98–111)
Creatinine, Ser: 1.01 mg/dL — ABNORMAL HIGH (ref 0.44–1.00)
GFR calc Af Amer: >60 mL/min (ref >60)
GFR calc non Af Amer: 58 mL/min — ABNORMAL LOW (ref >60)
Glucose, Bld: 84 mg/dL (ref 70–99)
Potassium: 3.5 mmol/L (ref 3.5–5.1)
Sodium: 138 mmol/L (ref 135–145)

## 2018-11-16 LAB — TYPE AND SCREEN
ABO/RH(D): A POS
Antibody Screen: NEGATIVE

## 2018-11-16 LAB — CBC
HCT: 43.8 % (ref 36.0–46.0)
Hemoglobin: 14.6 g/dL (ref 12.0–15.0)
MCH: 27.9 pg (ref 26.0–34.0)
MCHC: 33.3 g/dL (ref 30.0–36.0)
MCV: 83.7 fL (ref 80.0–100.0)
Platelets: 184 10*3/uL (ref 150–400)
RBC: 5.23 MIL/uL — ABNORMAL HIGH (ref 3.87–5.11)
RDW: 13.3 % (ref 11.5–15.5)
WBC: 5.5 10*3/uL (ref 4.0–10.5)
nRBC: 0 % (ref 0.0–0.2)

## 2018-11-16 LAB — SURGICAL PCR SCREEN
MRSA, PCR: NEGATIVE
Staphylococcus aureus: NEGATIVE
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
## 2018-11-16 NOTE — Progress Notes (Signed)
PCP - Dr. Tami Lin Cardiologist - Dr. Lennice Sites- [place]  Chest x-ray - 11/16/18 EKG - 11/13/18- C.E.- tracing requested Stress Test - 2014 ECHO - 2009 Cardiac Cath - 2014  Sleep Study - history of OSA CPAP - does not use, "had adverse reaction to CPAP"   Aspirin Instructions: Patient instructed to hold all Aspirin, NSAID's, herbal medications, fish oil and vitamins 7 days prior to surgery.   Anesthesia review: cardiac history- records requested  Patient denies shortness of breath, fever, cough and chest pain at PAT appointment   Patient verbalized understanding of instructions that were given to them at the PAT appointment. Patient was also instructed that they will need to review over the PAT instructions again at home before surgery.

## 2018-11-16 NOTE — H&P (Deleted)
  The note originally documented on this encounter has been moved the the encounter in which it belongs.  
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
## 2018-11-21 MED ORDER — TRANEXAMIC ACID-NACL 1000-0.7 MG/100ML-% IV SOLN
1000.0000 mg | INTRAVENOUS | Status: AC
  Administered 2018-11-22: 1000 mg via INTRAVENOUS
  Filled 2018-11-21: qty 100

## 2018-11-21 MED ORDER — CEFAZOLIN SODIUM-DEXTROSE 2-4 GM/100ML-% IV SOLN
2.0000 g | INTRAVENOUS | Status: AC
  Administered 2018-11-22 (×2): 2 g via INTRAVENOUS
  Filled 2018-11-21: qty 100

## 2018-11-21 NOTE — Anesthesia Preprocedure Evaluation (Addendum)
Anesthesia Evaluation  Patient identified by MRN, date of birth, ID band Patient awake    Reviewed: Allergy & Precautions, NPO status , Patient's Chart, lab work & pertinent test results  Airway Mallampati: II  TM Distance: >3 FB Neck ROM: Limited    Dental no notable dental hx.    Pulmonary neg pulmonary ROS, former smoker,    Pulmonary exam normal breath sounds clear to auscultation       Cardiovascular hypertension, Pt. on medications Normal cardiovascular exam Rhythm:Regular Rate:Normal     Neuro/Psych negative neurological ROS  negative psych ROS   GI/Hepatic Neg liver ROS, GERD  ,  Endo/Other  negative endocrine ROS  Renal/GU negative Renal ROS  negative genitourinary   Musculoskeletal negative musculoskeletal ROS (+)   Abdominal   Peds negative pediatric ROS (+)  Hematology negative hematology ROS (+)   Anesthesia Other Findings   Reproductive/Obstetrics negative OB ROS                            Anesthesia Physical Anesthesia Plan  ASA: II  Anesthesia Plan: General   Post-op Pain Management:    Induction: Intravenous  PONV Risk Score and Plan: 3 and Ondansetron, Dexamethasone and Treatment may vary due to age or medical condition  Airway Management Planned: Oral ETT  Additional Equipment:   Intra-op Plan:   Post-operative Plan: Extubation in OR  Informed Consent: I have reviewed the patients History and Physical, chart, labs and discussed the procedure including the risks, benefits and alternatives for the proposed anesthesia with the patient or authorized representative who has indicated his/her understanding and acceptance.     Dental advisory given  Plan Discussed with: CRNA and Surgeon  Anesthesia Plan Comments: (PAT note written by Myra Gianotti, PA-C. )       Anesthesia Quick Evaluation

## 2018-11-22 ENCOUNTER — Inpatient Hospital Stay (HOSPITAL_COMMUNITY): Payer: Medicare Other | Admitting: Certified Registered"

## 2018-11-22 ENCOUNTER — Inpatient Hospital Stay (HOSPITAL_COMMUNITY): Payer: Medicare Other

## 2018-11-22 ENCOUNTER — Other Ambulatory Visit: Payer: Self-pay

## 2018-11-22 ENCOUNTER — Encounter (HOSPITAL_COMMUNITY): Payer: Self-pay

## 2018-11-22 ENCOUNTER — Inpatient Hospital Stay (HOSPITAL_COMMUNITY)
Admission: RE | Admit: 2018-11-22 | Discharge: 2018-11-25 | DRG: 460 | Disposition: A | Discharge status: Home / Self Care | Payer: Medicare Other | Attending: Orthopedic Surgery | Admitting: Orthopedic Surgery

## 2018-11-22 ENCOUNTER — Inpatient Hospital Stay (HOSPITAL_COMMUNITY)
Admission: RE | Disposition: A | Discharge status: Home / Self Care | Payer: Self-pay | Source: Home / Self Care | Attending: Orthopedic Surgery

## 2018-11-22 ENCOUNTER — Inpatient Hospital Stay (HOSPITAL_COMMUNITY): Payer: Medicare Other | Admitting: Vascular Surgery

## 2018-11-22 DIAGNOSIS — K5900 Constipation, unspecified: Secondary | ICD-10-CM | POA: Diagnosis not present

## 2018-11-22 DIAGNOSIS — F419 Anxiety disorder, unspecified: Secondary | ICD-10-CM | POA: Diagnosis present

## 2018-11-22 DIAGNOSIS — M4800 Spinal stenosis, site unspecified: Secondary | ICD-10-CM | POA: Diagnosis present

## 2018-11-22 DIAGNOSIS — Z981 Arthrodesis status: Secondary | ICD-10-CM

## 2018-11-22 DIAGNOSIS — Z91048 Other nonmedicinal substance allergy status: Secondary | ICD-10-CM | POA: Diagnosis not present

## 2018-11-22 DIAGNOSIS — Z96651 Presence of right artificial knee joint: Secondary | ICD-10-CM | POA: Diagnosis present

## 2018-11-22 DIAGNOSIS — Z90710 Acquired absence of both cervix and uterus: Secondary | ICD-10-CM

## 2018-11-22 DIAGNOSIS — Z79899 Other long term (current) drug therapy: Secondary | ICD-10-CM | POA: Diagnosis not present

## 2018-11-22 DIAGNOSIS — Z7982 Long term (current) use of aspirin: Secondary | ICD-10-CM | POA: Diagnosis not present

## 2018-11-22 DIAGNOSIS — M4807 Spinal stenosis, lumbosacral region: Principal | ICD-10-CM | POA: Diagnosis present

## 2018-11-22 DIAGNOSIS — F909 Attention-deficit hyperactivity disorder, unspecified type: Secondary | ICD-10-CM | POA: Diagnosis present

## 2018-11-22 DIAGNOSIS — G4730 Sleep apnea, unspecified: Secondary | ICD-10-CM | POA: Diagnosis present

## 2018-11-22 DIAGNOSIS — Z87891 Personal history of nicotine dependence: Secondary | ICD-10-CM | POA: Diagnosis not present

## 2018-11-22 DIAGNOSIS — I10 Essential (primary) hypertension: Secondary | ICD-10-CM | POA: Diagnosis present

## 2018-11-22 DIAGNOSIS — E785 Hyperlipidemia, unspecified: Secondary | ICD-10-CM | POA: Diagnosis present

## 2018-11-22 DIAGNOSIS — Z8249 Family history of ischemic heart disease and other diseases of the circulatory system: Secondary | ICD-10-CM

## 2018-11-22 DIAGNOSIS — G9611 Dural tear: Secondary | ICD-10-CM | POA: Diagnosis present

## 2018-11-22 DIAGNOSIS — Z803 Family history of malignant neoplasm of breast: Secondary | ICD-10-CM

## 2018-11-22 DIAGNOSIS — Z9049 Acquired absence of other specified parts of digestive tract: Secondary | ICD-10-CM | POA: Diagnosis not present

## 2018-11-22 DIAGNOSIS — K219 Gastro-esophageal reflux disease without esophagitis: Secondary | ICD-10-CM | POA: Diagnosis present

## 2018-11-22 DIAGNOSIS — J45909 Unspecified asthma, uncomplicated: Secondary | ICD-10-CM | POA: Diagnosis present

## 2018-11-22 DIAGNOSIS — G960 Cerebrospinal fluid leak: Secondary | ICD-10-CM | POA: Diagnosis not present

## 2018-11-22 DIAGNOSIS — R441 Visual hallucinations: Secondary | ICD-10-CM | POA: Diagnosis not present

## 2018-11-22 DIAGNOSIS — R440 Auditory hallucinations: Secondary | ICD-10-CM | POA: Diagnosis not present

## 2018-11-22 DIAGNOSIS — M48062 Spinal stenosis, lumbar region with neurogenic claudication: Secondary | ICD-10-CM | POA: Diagnosis present

## 2018-11-22 DIAGNOSIS — F329 Major depressive disorder, single episode, unspecified: Secondary | ICD-10-CM | POA: Diagnosis present

## 2018-11-22 DIAGNOSIS — Z823 Family history of stroke: Secondary | ICD-10-CM

## 2018-11-22 DIAGNOSIS — M2578 Osteophyte, vertebrae: Secondary | ICD-10-CM | POA: Diagnosis present

## 2018-11-22 DIAGNOSIS — Z419 Encounter for procedure for purposes other than remedying health state, unspecified: Secondary | ICD-10-CM

## 2018-11-22 DIAGNOSIS — Z825 Family history of asthma and other chronic lower respiratory diseases: Secondary | ICD-10-CM

## 2018-11-22 HISTORY — DX: Spinal stenosis, site unspecified: M48.00

## 2018-11-22 SURGERY — POSTERIOR LUMBAR FUSION 3 LEVEL
Anesthesia: General

## 2018-11-22 MED ORDER — SCOPOLAMINE 1 MG/3DAYS TD PT72
MEDICATED_PATCH | TRANSDERMAL | Status: AC
  Administered 2018-11-22: 1.5 mg via TRANSDERMAL
  Filled 2018-11-22: qty 1

## 2018-11-22 MED ORDER — EPHEDRINE 5 MG/ML INJ
INTRAVENOUS | Status: AC
  Filled 2018-11-22: qty 10

## 2018-11-22 MED ORDER — ACETAMINOPHEN 10 MG/ML IV SOLN
INTRAVENOUS | Status: AC
  Filled 2018-11-22: qty 300

## 2018-11-22 MED ORDER — METHOCARBAMOL 500 MG PO TABS
500.0000 mg | ORAL_TABLET | Freq: Four times a day (QID) | ORAL | Status: DC | PRN
  Administered 2018-11-22 – 2018-11-23 (×4): 500 mg via ORAL
  Filled 2018-11-22 (×4): qty 1

## 2018-11-22 MED ORDER — FENTANYL CITRATE (PF) 250 MCG/5ML IJ SOLN
INTRAMUSCULAR | Status: AC
  Filled 2018-11-22: qty 5

## 2018-11-22 MED ORDER — DIPHENHYDRAMINE HCL 50 MG/ML IJ SOLN
INTRAMUSCULAR | Status: AC
  Filled 2018-11-22: qty 1

## 2018-11-22 MED ORDER — DEXAMETHASONE SODIUM PHOSPHATE 10 MG/ML IJ SOLN
INTRAMUSCULAR | Status: AC
  Filled 2018-11-22: qty 1

## 2018-11-22 MED ORDER — LIDOCAINE 2% (20 MG/ML) 5 ML SYRINGE
INTRAMUSCULAR | Status: DC | PRN
  Administered 2018-11-22: 60 mg via INTRAVENOUS

## 2018-11-22 MED ORDER — MIDAZOLAM HCL 5 MG/5ML IJ SOLN
INTRAMUSCULAR | Status: DC | PRN
  Administered 2018-11-22: 2 mg via INTRAVENOUS

## 2018-11-22 MED ORDER — PROPOFOL 10 MG/ML IV BOLUS
INTRAVENOUS | Status: AC
  Filled 2018-11-22: qty 40

## 2018-11-22 MED ORDER — LIDOCAINE 2% (20 MG/ML) 5 ML SYRINGE
INTRAMUSCULAR | Status: AC
  Filled 2018-11-22: qty 5

## 2018-11-22 MED ORDER — SUGAMMADEX SODIUM 200 MG/2ML IV SOLN
INTRAVENOUS | Status: DC | PRN
  Administered 2018-11-22: 300 mg via INTRAVENOUS

## 2018-11-22 MED ORDER — ONDANSETRON HCL 4 MG/2ML IJ SOLN
INTRAMUSCULAR | Status: AC
  Filled 2018-11-22: qty 4

## 2018-11-22 MED ORDER — SODIUM CHLORIDE 0.9 % IV SOLN: INTRAVENOUS | Status: DC | PRN

## 2018-11-22 MED ORDER — PROPOFOL 10 MG/ML IV BOLUS
INTRAVENOUS | Status: DC | PRN
  Administered 2018-11-22: 150 mg via INTRAVENOUS

## 2018-11-22 MED ORDER — ROCURONIUM BROMIDE 50 MG/5ML IV SOSY
PREFILLED_SYRINGE | INTRAVENOUS | Status: AC
  Filled 2018-11-22: qty 10

## 2018-11-22 MED ORDER — DULOXETINE HCL 30 MG PO CPEP
30.0000 mg | ORAL_CAPSULE | Freq: Every day | ORAL | Status: DC
  Administered 2018-11-23 – 2018-11-25 (×3): 30 mg via ORAL
  Filled 2018-11-22 (×3): qty 1

## 2018-11-22 MED ORDER — 0.9 % SODIUM CHLORIDE (POUR BTL) OPTIME
TOPICAL | Status: DC | PRN
  Administered 2018-11-22: 1000 mL

## 2018-11-22 MED ORDER — THROMBIN (RECOMBINANT) 20000 UNITS EX SOLR
CUTANEOUS | Status: AC
  Filled 2018-11-22: qty 20000

## 2018-11-22 MED ORDER — DEXAMETHASONE SODIUM PHOSPHATE 10 MG/ML IJ SOLN
INTRAMUSCULAR | Status: DC | PRN
  Administered 2018-11-22: 10 mg via INTRAVENOUS

## 2018-11-22 MED ORDER — GLYCOPYRROLATE PF 0.2 MG/ML IJ SOSY
PREFILLED_SYRINGE | INTRAMUSCULAR | Status: DC | PRN
  Administered 2018-11-22: .2 mg via INTRAVENOUS

## 2018-11-22 MED ORDER — EPINEPHRINE PF 1 MG/ML IJ SOLN
INTRAMUSCULAR | Status: DC | PRN
  Administered 2018-11-22: 1 mg

## 2018-11-22 MED ORDER — ALBUMIN HUMAN 5 % IV SOLN
INTRAVENOUS | Status: DC | PRN
  Administered 2018-11-22 (×2): via INTRAVENOUS

## 2018-11-22 MED ORDER — POLYETHYLENE GLYCOL 3350 17 G PO PACK: 17.0000 g | PACK | Freq: Every day | ORAL | Status: DC | PRN

## 2018-11-22 MED ORDER — HYDROMORPHONE HCL 1 MG/ML IJ SOLN: 0.2500 mg | INTRAMUSCULAR | Status: DC | PRN

## 2018-11-22 MED ORDER — LACTATED RINGERS IV SOLN
INTRAVENOUS | Status: DC | PRN
  Administered 2018-11-22: 07:00:00 via INTRAVENOUS

## 2018-11-22 MED ORDER — MENTHOL 3 MG MT LOZG: 1.0000 | LOZENGE | OROMUCOSAL | Status: DC | PRN

## 2018-11-22 MED ORDER — METHOCARBAMOL 1000 MG/10ML IJ SOLN
500.0000 mg | Freq: Four times a day (QID) | INTRAVENOUS | Status: DC | PRN
  Filled 2018-11-22: qty 5

## 2018-11-22 MED ORDER — HYDROMORPHONE HCL 1 MG/ML IJ SOLN
1.0000 mg | INTRAMUSCULAR | Status: AC | PRN
  Administered 2018-11-22 – 2018-11-23 (×2): 1 mg via INTRAVENOUS
  Filled 2018-11-22 (×2): qty 1

## 2018-11-22 MED ORDER — AMLODIPINE BESYLATE 10 MG PO TABS
10.0000 mg | ORAL_TABLET | Freq: Every day | ORAL | Status: DC
  Administered 2018-11-23 – 2018-11-25 (×3): 10 mg via ORAL
  Filled 2018-11-22 (×2): qty 2
  Filled 2018-11-22: qty 1

## 2018-11-22 MED ORDER — EPINEPHRINE PF 1 MG/ML IJ SOLN
INTRAMUSCULAR | Status: AC
  Filled 2018-11-22: qty 1

## 2018-11-22 MED ORDER — ONDANSETRON HCL 4 MG/2ML IJ SOLN
INTRAMUSCULAR | Status: AC
  Filled 2018-11-22: qty 2

## 2018-11-22 MED ORDER — ACETAMINOPHEN 10 MG/ML IV SOLN
INTRAVENOUS | Status: DC | PRN
  Administered 2018-11-22: 1000 mg via INTRAVENOUS

## 2018-11-22 MED ORDER — CEFAZOLIN SODIUM-DEXTROSE 1-4 GM/50ML-% IV SOLN
1.0000 g | Freq: Three times a day (TID) | INTRAVENOUS | Status: AC
  Administered 2018-11-22 – 2018-11-23 (×2): 1 g via INTRAVENOUS
  Filled 2018-11-22 (×2): qty 50

## 2018-11-22 MED ORDER — ONDANSETRON HCL 4 MG/2ML IJ SOLN
4.0000 mg | Freq: Four times a day (QID) | INTRAMUSCULAR | Status: DC | PRN
  Administered 2018-11-23: 4 mg via INTRAVENOUS
  Filled 2018-11-22: qty 2

## 2018-11-22 MED ORDER — FENTANYL CITRATE (PF) 100 MCG/2ML IJ SOLN
INTRAMUSCULAR | Status: DC | PRN
  Administered 2018-11-22: 100 ug via INTRAVENOUS
  Administered 2018-11-22: 50 ug via INTRAVENOUS
  Administered 2018-11-22: 100 ug via INTRAVENOUS

## 2018-11-22 MED ORDER — SCOPOLAMINE 1 MG/3DAYS TD PT72
1.0000 | MEDICATED_PATCH | TRANSDERMAL | Status: DC
  Administered 2018-11-22: 1.5 mg via TRANSDERMAL

## 2018-11-22 MED ORDER — ACETAMINOPHEN 325 MG PO TABS
650.0000 mg | ORAL_TABLET | ORAL | Status: DC | PRN
  Administered 2018-11-23 – 2018-11-25 (×3): 650 mg via ORAL
  Filled 2018-11-22 (×3): qty 2

## 2018-11-22 MED ORDER — ARTIFICIAL TEARS OPHTHALMIC OINT
TOPICAL_OINTMENT | OPHTHALMIC | Status: AC
  Filled 2018-11-22: qty 3.5

## 2018-11-22 MED ORDER — SURGIFOAM 100 EX MISC: CUTANEOUS | Status: DC | PRN

## 2018-11-22 MED ORDER — OXYCODONE HCL 5 MG PO TABS
5.0000 mg | ORAL_TABLET | ORAL | Status: DC | PRN
  Administered 2018-11-22 – 2018-11-23 (×3): 5 mg via ORAL
  Filled 2018-11-22 (×2): qty 1

## 2018-11-22 MED ORDER — THROMBIN 20000 UNITS EX SOLR
CUTANEOUS | Status: DC | PRN
  Administered 2018-11-22: 09:00:00 via TOPICAL

## 2018-11-22 MED ORDER — HEMOSTATIC AGENTS (NO CHARGE) OPTIME
TOPICAL | Status: DC | PRN
  Administered 2018-11-22: 2 via TOPICAL

## 2018-11-22 MED ORDER — KETOROLAC TROMETHAMINE 0.5 % OP SOLN
1.0000 [drp] | Freq: Four times a day (QID) | OPHTHALMIC | Status: DC
  Administered 2018-11-22 – 2018-11-23 (×4): 1 [drp] via OPHTHALMIC
  Filled 2018-11-22: qty 5

## 2018-11-22 MED ORDER — SODIUM CHLORIDE 0.9 % IV SOLN
INTRAVENOUS | Status: DC | PRN
  Administered 2018-11-22: 70 ug/min via INTRAVENOUS

## 2018-11-22 MED ORDER — VANCOMYCIN HCL 10 G IV SOLR: 1500.0000 mg | INTRAVENOUS | Status: DC

## 2018-11-22 MED ORDER — SODIUM CHLORIDE 0.9% FLUSH: 3.0000 mL | INTRAVENOUS | Status: DC | PRN

## 2018-11-22 MED ORDER — SODIUM CHLORIDE 0.9 % IV SOLN: 250.0000 mL | INTRAVENOUS | Status: DC

## 2018-11-22 MED ORDER — EPHEDRINE SULFATE-NACL 50-0.9 MG/10ML-% IV SOSY
PREFILLED_SYRINGE | INTRAVENOUS | Status: DC | PRN
  Administered 2018-11-22: 10 mg via INTRAVENOUS

## 2018-11-22 MED ORDER — PHENOL 1.4 % MT LIQD: 1.0000 | OROMUCOSAL | Status: DC | PRN

## 2018-11-22 MED ORDER — SODIUM CHLORIDE 0.9% FLUSH
3.0000 mL | Freq: Two times a day (BID) | INTRAVENOUS | Status: DC
  Administered 2018-11-22 – 2018-11-24 (×3): 3 mL via INTRAVENOUS

## 2018-11-22 MED ORDER — ONDANSETRON HCL 4 MG PO TABS: 4.0000 mg | ORAL_TABLET | Freq: Four times a day (QID) | ORAL | Status: DC | PRN

## 2018-11-22 MED ORDER — ONDANSETRON HCL 4 MG/2ML IJ SOLN
INTRAMUSCULAR | Status: DC | PRN
  Administered 2018-11-22 (×2): 4 mg via INTRAVENOUS

## 2018-11-22 MED ORDER — DIPHENHYDRAMINE HCL 50 MG/ML IJ SOLN
INTRAMUSCULAR | Status: DC | PRN
  Administered 2018-11-22: 25 mg via INTRAVENOUS

## 2018-11-22 MED ORDER — GLYCOPYRROLATE PF 0.2 MG/ML IJ SOSY
PREFILLED_SYRINGE | INTRAMUSCULAR | Status: AC
  Filled 2018-11-22: qty 2

## 2018-11-22 MED ORDER — ROCURONIUM BROMIDE 10 MG/ML (PF) SYRINGE
PREFILLED_SYRINGE | INTRAVENOUS | Status: DC | PRN
  Administered 2018-11-22 (×2): 20 mg via INTRAVENOUS
  Administered 2018-11-22: 60 mg via INTRAVENOUS

## 2018-11-22 MED ORDER — BUPIVACAINE HCL (PF) 0.25 % IJ SOLN
INTRAMUSCULAR | Status: DC | PRN
  Administered 2018-11-22: 10 mL

## 2018-11-22 MED ORDER — OXYCODONE HCL 5 MG PO TABS
10.0000 mg | ORAL_TABLET | ORAL | Status: DC | PRN
  Administered 2018-11-23 – 2018-11-25 (×12): 10 mg via ORAL
  Filled 2018-11-22 (×13): qty 2

## 2018-11-22 MED ORDER — MIDAZOLAM HCL 2 MG/2ML IJ SOLN
INTRAMUSCULAR | Status: AC
  Filled 2018-11-22: qty 2

## 2018-11-22 MED ORDER — BUPIVACAINE HCL (PF) 0.25 % IJ SOLN
INTRAMUSCULAR | Status: AC
  Filled 2018-11-22: qty 30

## 2018-11-22 MED ORDER — ACETAMINOPHEN 650 MG RE SUPP: 650.0000 mg | RECTAL | Status: DC | PRN

## 2018-11-22 MED ORDER — PROMETHAZINE HCL 25 MG/ML IJ SOLN: 6.2500 mg | INTRAMUSCULAR | Status: DC | PRN

## 2018-11-22 MED ORDER — LACTATED RINGERS IV SOLN: INTRAVENOUS | Status: DC

## 2018-11-22 SURGICAL SUPPLY — 64 items
BLADE CLIPPER SURG (BLADE) IMPLANT
BUR EGG ELITE 4.0 (BURR) ×2 IMPLANT
BUR SURG 4X8 MED (BURR) IMPLANT
BURR SURG 4X8 MED (BURR)
CABLE BIPOLOR RESECTION CORD (MISCELLANEOUS) ×2 IMPLANT
CLSR STERI-STRIP ANTIMIC 1/2X4 (GAUZE/BANDAGES/DRESSINGS) ×2 IMPLANT
COVER MAYO STAND STRL (DRAPES) ×4 IMPLANT
COVER SURGICAL LIGHT HANDLE (MISCELLANEOUS) ×2 IMPLANT
COVER WAND RF STERILE (DRAPES) ×2 IMPLANT
DRAPE C-ARM 42X72 X-RAY (DRAPES) ×4 IMPLANT
DRAPE C-ARMOR (DRAPES) ×2 IMPLANT
DRAPE POUCH INSTRU U-SHP 10X18 (DRAPES) ×2 IMPLANT
DRAPE SURG 17X23 STRL (DRAPES) ×2 IMPLANT
DRAPE U-SHAPE 47X51 STRL (DRAPES) ×2 IMPLANT
DRSG OPSITE POSTOP 4X10 (GAUZE/BANDAGES/DRESSINGS) ×2 IMPLANT
DRSG OPSITE POSTOP 4X8 (GAUZE/BANDAGES/DRESSINGS) ×2 IMPLANT
DURAPREP 26ML APPLICATOR (WOUND CARE) ×2 IMPLANT
ELECT BLADE 4.0 EZ CLEAN MEGAD (MISCELLANEOUS)
ELECT BLADE 6.5 EXT (BLADE) ×2 IMPLANT
ELECT CAUTERY BLADE 6.4 (BLADE) ×2 IMPLANT
ELECT PENCIL ROCKER SW 15FT (MISCELLANEOUS) ×2 IMPLANT
ELECT REM PT RETURN 9FT ADLT (ELECTROSURGICAL) ×2
ELECTRODE BLDE 4.0 EZ CLN MEGD (MISCELLANEOUS) IMPLANT
ELECTRODE REM PT RTRN 9FT ADLT (ELECTROSURGICAL) ×1 IMPLANT
FILTER STRAW FLUID ASPIR (MISCELLANEOUS) ×2 IMPLANT
GLOVE BIOGEL PI IND STRL 8.5 (GLOVE) ×1 IMPLANT
GLOVE BIOGEL PI INDICATOR 8.5 (GLOVE) ×1
GLOVE SS BIOGEL STRL SZ 8.5 (GLOVE) ×1 IMPLANT
GLOVE SUPERSENSE BIOGEL SZ 8.5 (GLOVE) ×1
GOWN STRL REUS W/ TWL LRG LVL3 (GOWN DISPOSABLE) ×1 IMPLANT
GOWN STRL REUS W/TWL 2XL LVL3 (GOWN DISPOSABLE) ×4 IMPLANT
GOWN STRL REUS W/TWL LRG LVL3 (GOWN DISPOSABLE) ×1
GRAFT DURAGEN MATRIX 1WX1L (Tissue) ×2 IMPLANT
KIT BASIN OR (CUSTOM PROCEDURE TRAY) ×2 IMPLANT
KIT POSITION SURG JACKSON T1 (MISCELLANEOUS) ×2 IMPLANT
KIT TURNOVER KIT B (KITS) ×2 IMPLANT
NEEDLE 22X1 1/2 (OR ONLY) (NEEDLE) ×2 IMPLANT
NEEDLE SPNL 18GX3.5 QUINCKE PK (NEEDLE) ×4 IMPLANT
NS IRRIG 1000ML POUR BTL (IV SOLUTION) ×4 IMPLANT
PACK LAMINECTOMY ORTHO (CUSTOM PROCEDURE TRAY) ×2 IMPLANT
PACK UNIVERSAL I (CUSTOM PROCEDURE TRAY) ×2 IMPLANT
PAD ARMBOARD 7.5X6 YLW CONV (MISCELLANEOUS) ×4 IMPLANT
PATTIES SURGICAL .5 X.5 (GAUZE/BANDAGES/DRESSINGS) ×8 IMPLANT
PATTIES SURGICAL .5 X1 (DISPOSABLE) ×2 IMPLANT
POSITIONER HEAD PRONE TRACH (MISCELLANEOUS) ×2 IMPLANT
SPONGE LAP 4X18 RFD (DISPOSABLE) ×10 IMPLANT
SPONGE SURGIFOAM ABS GEL 100 (HEMOSTASIS) ×2 IMPLANT
SURGIFLO W/THROMBIN 8M KIT (HEMOSTASIS) ×4 IMPLANT
SUT BONE WAX W31G (SUTURE) ×2 IMPLANT
SUT MNCRL AB 3-0 PS2 18 (SUTURE) ×2 IMPLANT
SUT NURALON 4 0 TR CR/8 (SUTURE) ×2 IMPLANT
SUT VIC AB 1 CT1 18XCR BRD 8 (SUTURE) ×1 IMPLANT
SUT VIC AB 1 CT1 8-18 (SUTURE) ×1
SUT VIC AB 1 CTX 36 (SUTURE)
SUT VIC AB 1 CTX36XBRD ANBCTR (SUTURE) IMPLANT
SUT VIC AB 2-0 CT1 18 (SUTURE) ×2 IMPLANT
SYR BULB IRRIGATION 50ML (SYRINGE) ×2 IMPLANT
SYR CONTROL 10ML LL (SYRINGE) ×2 IMPLANT
SYR TB 1ML LUER SLIP (SYRINGE) ×2 IMPLANT
TOWEL GREEN STERILE (TOWEL DISPOSABLE) ×2 IMPLANT
TOWEL GREEN STERILE FF (TOWEL DISPOSABLE) ×2 IMPLANT
TRAY FOLEY MTR SLVR 16FR STAT (SET/KITS/TRAYS/PACK) ×2 IMPLANT
WATER STERILE IRR 1000ML POUR (IV SOLUTION) IMPLANT
YANKAUER SUCT BULB TIP NO VENT (SUCTIONS) ×2 IMPLANT

## 2018-11-22 NOTE — H&P (Signed)
Addendum dictation:  No change in patient's clinical exam since her last office visit of 11/16/2018.  Patient continues to have significant back buttock and neurogenic lower extremity pain.    I have reviewed the case as well as the risks and benefits with the patient and her daughter and all their questions were encouraged and addressed.  Risks and benefits of surgery were discussed with the patient. These include: Infection, bleeding, death, stroke, paralysis, ongoing or worse pain, need for additional surgery, leak of spinal fluid, adjacent segment degeneration requiring additional surgery, post-operative hematoma formation that can result in neurological compromise and the need for urgent/emergent re-operation. Loss in bowel and bladder control. Injury to major vessels that could result in the need for urgent abdominal surgery to stop bleeding. Risk of deep venous thrombosis (DVT) and the need for additional treatment. Recurrent disc herniation resulting in the need for revision surgery, which could include fusion surgery (utilizing instrumentation such as pedicle screws and intervertebral cages).  Surgical plan: Posterior lumbar decompression and in situ fusion for lumbar spinal stenosis with neurogenic claudication L3-S1.

## 2018-11-22 NOTE — Progress Notes (Signed)
Orthopedic Tech Progress Note Patient Details:  MARTINIQUE PIZZIMENTI 1950/11/17 [id number] RN said patient has brace Patient ID: [name], female   DOB: 1950/11/13, 68 y.o.   MRN: [id number]   Janit Pagan 11/22/2018, 2:46 PM

## 2018-11-22 NOTE — Anesthesia Procedure Notes (Signed)
Procedure Name: Intubation Date/Time: 11/22/2018 7:42 AM Performed by: Cleda Daub, CRNA Pre-anesthesia Checklist: Patient identified, Emergency Drugs available, Suction available and Patient being monitored Patient Re-evaluated:Patient Re-evaluated prior to induction Oxygen Delivery Method: Circle system utilized Preoxygenation: Pre-oxygenation with 100% oxygen Induction Type: IV induction Ventilation: Mask ventilation without difficulty and Mask ventilation throughout procedure Laryngoscope Size: Mac and 3 Grade View: Grade I Tube size: 7.5 mm Number of attempts: 1 Airway Equipment and Method: Stylet Placement Confirmation: ETT inserted through vocal cords under direct vision,  positive ETCO2 and breath sounds checked- equal and bilateral Secured at: 22 cm Tube secured with: Tape Dental Injury: Teeth and Oropharynx as per pre-operative assessment  Comments: Pt is allergic to tape; ETT secured tape since pt was going to be in prone position.  Benadryl given as documented per Dr. Kalman Shan request; Dr. Rolena Infante aware.

## 2018-11-22 NOTE — Op Note (Signed)
Operative note  Preoperative diagnosis severe lumbar spinal stenosis with neurogenic claudication L3-5 with right lateral recess stenosis L5-S1  Postoperative diagnosis: Same  Operative procedure 1.  Lumbar decompression L3-S1.  2 in situ arthrodesis L3-S1  Graft: Autograft taken from decompression.  Complications: Intraoperative iatrogenic durotomy.  This was identified at the time of surgery and repaired primarily with 4-0 Nurolon.  Dural patch was also placed.  At the conclusion of the case Valsalva to 40 held for 10 seconds done on 2 occasions demonstrated no persistent CSF leak.  First assistant: Dr. Phylliss Bob assist me for closing of the durotomy.  Indications: Patient is a very pleasant 68 year old man with severe debilitating neurogenic claudication.  Imaging studies demonstrated significant spinal stenosis central and lateral recess at L3-4 and L4-5 and some foraminal disease at L5-S1.  Attempted conservative management had failed to alleviate her pain and so elected to move forward with a lumbar decompression.  Because of the slight spondylolisthesis in the for a wide decompression I also elected to do a posterior lateral arthrodesis.  All appropriate risks benefits and alternatives to surgery were discussed with the patient and consent was obtained.  Operative report: Patient was brought the operating room placed upon the operating room table.  After successful induction of general anesthesia and endotracheal ovation teds SCDs and a Foley were inserted.  She was turned prone onto the Wilson frame and all bony prominences well-padded.  The back was prepped and draped in a standard fashion.  Timeout was taken to confirm patient procedure and all other important data.  2 needles were placed into the back and an x-ray was taken to localize the incision site.  The incision was infiltrated with quarter percent Marcaine with epinephrine and a midline incision was made and sharp dissection  was carried out down to the deep fascia.  The fascia was sharply incised and I stripped the paraspinal muscles to expose the L3-L4-L5 spinous process and lamina.  Once this was done I then placed a marker on the L4-5 interspace and took an x-ray to confirm I was at the appropriate level.  The inferior third of the spinous process of L5 and the entire spinous process of L4 were removed.  Based on her preoperative MRI there was significant central and lateral recess stenosis primarily at the L4-5 and the L3-4 level.  I then carefully and gently began my dissection.  I developed a plane underneath the lamina and then used my 1 and 2 mm Kerrison punch to perform a laminectomy of L4.  There was significant facet hypertrophy and osteophyte formation.  This was all removed using my 2 and 3 mm Kerrison punch.  I was eventually able to see the central raphae of the ligamentum flavum and I gently dissected in this area with a Penfield 4.  Using a 2 mm Kerrison punch I began removing the ligamentum flavum until I could visualize the dorsal surface of the thecal sac.  There was still significant compression and I began gently creating a plane between the thecal sac and the ligamentum flavum.  I then used my Kerrison punch to remove the ligamentum flavu  Once I had the central portion of the L4 they are exposed I proceeded inferiorly taking down the peri-or third of the spinous process and lamina of L5.  I was unable to go into the lateral recess and palpate the L5 pedicle.  I was unable to go inferiorly to this to the L5 foramen.  This was  done bilaterally.  I could then take my [facility] and freely pass it down to the inferior aspect of the L5 vertebral body.  Based on the preoperative MRI I felt as though I had adequately decompress the lateral recess in the area of maximum compression.  I then continued superiorly where the central and lateral recess stenosis was most significant.  Again gently began completing the L4  laminectomy.  I then remove inferior portion of the L3 spinous process and began dissecting into the central raphae.  While dissecting in the lateral recess on the left side there was significant medial facet osteophyte.  I gently created a plane with my Penfield 4 and used my 1 mm Kerrison to begin to resect it.  After I resected the first 1 the dura expanded and was punched by the sharp edge of the lamina creating a durotomy.  At this point I packed the area with a dural patty and then went to the right side and completed my decompression in the lateral recess.  Ultimately taking a great deal of time and removing significant medial overhanging osteophytes from the facet I was able to resect and remove and decompress the lateral recess from the inferior aspect of the L3 pedicle all the way down to the inferior aspect of the L5 pedicle.  Once I had the right side decompressed I was able to easier anterior on the left side.  I again decompressed out laterally to the level of the pedicle.  At this point I completed the decompression and I did remove the remaining osteophyte that had caused the durotomy.  I can now clearly visualize a 2 mm durotomy on the dorsal surface of the thecal sac.  At this point I requested a colleague Dr. Phylliss Bob to assist me closing the durotomy.  Having a second pair of hands to aid in suction and allow visualization of the durotomy while I directly repaired it was very helpful.  With the durotomy exposed I placed my 4-0 nylon sutures and secured it.  Once I had the durotomy closed I then irrigated and then performed a Valsalva to 24mmHg was held for 10 seconds.  There was no further persistent clear fluid leak.  With the decompression complete and the durotomy addressed I then decorticated the facet complex and transverse processes in the posterior lateral gutter.  I then packed the posterior lateral gutter with the autograft bone.  Once this was complete I then performed my final  irrigation.  I made sure that hemostasis using bipolar cautery.  This point I performed a second Valsalva to 40 mmHg and again there was no CSF clear fluid leak noted.  At this point for additional stability I did dural patch was placed over the durotomy site and then I placed a thrombin-soaked Gelfoam patty over the laminotomy site inferiorly to aid in hemostasis.  I then closed the deep fascia in an interrupted #1 Vicryl suture.  I then closed superficially with a running 0 Vicryl, 2-0 Vicryl, and a 3-0 Monocryl for the skin.  Steri-Strips and dry dressings were applied and the patient was extubated transfer the PACU without incident.  The end of the case all needle sponge counts were correct.  There were no adverse intraoperative events.

## 2018-11-22 NOTE — OR Nursing (Signed)
Dr. Farrel Demark called stating that 3rd xray is at l4-l5, Dr. Rolena Infante made aware.

## 2018-11-22 NOTE — Brief Op Note (Signed)
11/22/2018  1:24 PM  PATIENT:  Pamela Sims  68 y.o. female  PRE-OPERATIVE DIAGNOSIS:  Lumbar spinal stenosisL3-S1  POST-OPERATIVE DIAGNOSIS:  Lumbar spinal stenosisL3-S1  PROCEDURE:  Procedure(s) with comments: Lumbar decompression with insitu fusion L3-S1 (N/A) - 3.5 hrs  SURGEON:  Surgeon(s) and Role:    Melina Schools, MD - Primary    * Phylliss Bob, MD - Assisting - for durotomy closure  PHYSICIAN ASSISTANT:   ASSISTANTS: none   ANESTHESIA:   general  EBL:  650 mL   BLOOD ADMINISTERED:none  DRAINS: none   LOCAL MEDICATIONS USED:  MARCAINE     SPECIMEN:  No Specimen  DISPOSITION OF SPECIMEN:  N/A  COUNTS:  YES  TOURNIQUET:  * No tourniquets in log *  DICTATION: .Dragon Dictation  PLAN OF CARE: Admit to inpatient   PATIENT DISPOSITION:  PACU - hemodynamically stable.

## 2018-11-22 NOTE — Transfer of Care (Signed)
Immediate Anesthesia Transfer of Care Note  Patient: Pamela Sims  Procedure(s) Performed: Lumbar decompression with insitu fusion L3-S1 (N/A )  Patient Location: PACU  Anesthesia Type:General  Level of Consciousness: drowsy and patient cooperative  Airway & Oxygen Therapy: Patient Spontanous Breathing and Patient connected to face mask oxygen  Post-op Assessment: Report given to RN and Post -op Vital signs reviewed and stable  Post vital signs: Reviewed and stable  Last Vitals:  Vitals Value Taken Time  BP 136/73 11/22/2018  1:24 PM  Temp    Pulse 92 11/22/2018  1:28 PM  Resp 20 11/22/2018  1:28 PM  SpO2 94 % 11/22/2018  1:28 PM  Vitals shown include unvalidated device data.  Last Pain:  Vitals:   11/22/18 0605  TempSrc:   PainSc: 9          Complications: No apparent anesthesia complications

## 2018-11-23 MED ORDER — CYCLOBENZAPRINE HCL 10 MG PO TABS
10.0000 mg | ORAL_TABLET | Freq: Once | ORAL | Status: AC
  Administered 2018-11-23: 10 mg via ORAL
  Filled 2018-11-23: qty 1

## 2018-11-23 MED ORDER — DIAZEPAM 5 MG PO TABS
10.0000 mg | ORAL_TABLET | Freq: Three times a day (TID) | ORAL | Status: DC | PRN
  Administered 2018-11-23: 10 mg via ORAL
  Filled 2018-11-23: qty 2

## 2018-11-23 MED ORDER — GABAPENTIN 300 MG PO CAPS
300.0000 mg | ORAL_CAPSULE | Freq: Three times a day (TID) | ORAL | Status: DC
  Administered 2018-11-23 (×2): 300 mg via ORAL
  Filled 2018-11-23 (×2): qty 1

## 2018-11-23 MED ORDER — METHOCARBAMOL 750 MG PO TABS
750.0000 mg | ORAL_TABLET | Freq: Four times a day (QID) | ORAL | Status: DC | PRN
  Administered 2018-11-23 – 2018-11-25 (×6): 750 mg via ORAL
  Filled 2018-11-23 (×6): qty 1

## 2018-11-23 MED ORDER — HYDROXYZINE HCL 50 MG/ML IM SOLN
50.0000 mg | Freq: Four times a day (QID) | INTRAMUSCULAR | Status: DC | PRN
  Administered 2018-11-23 – 2018-11-24 (×2): 50 mg via INTRAMUSCULAR
  Filled 2018-11-23 (×3): qty 1

## 2018-11-23 MED FILL — Thrombin (Recombinant) For Soln 20000 Unit: CUTANEOUS | Qty: 1 | Status: AC

## 2018-11-23 NOTE — Progress Notes (Signed)
    Subjective: Procedure(s) (LRB): Lumbar decompression with insitu fusion L3-S1 (N/A) 1 Day Post-Op  Patient reports pain as 3 on 0-10 scale.  Reports complains of muscle spasm in right leg pain reports incisional back pain   N/A void - foley in place Negative bowel movement Positive flatus Negative chest pain or shortness of breath  Objective: Vital signs in last 24 hours: Temp:  [97.4 F (36.3 C)-98.4 F (36.9 C)] 98.2 F (36.8 C) (02/28 0158) Pulse Rate:  [78-91] 91 (02/28 0158) Resp:  [16-20] 18 (02/28 0158) BP: (128-155)/(66-85) 144/76 (02/28 0158) SpO2:  [92 %-97 %] 96 % (02/28 0158)  Intake/Output from previous day: 02/27 0701 - 02/28 0700 In: 3000 [I.V.:2500; IV Piggyback:500] Out: 4150 [Urine:3500; Blood:650]  Labs: No results for input(s): WBC, RBC, HCT, PLT in the last 72 hours. No results for input(s): NA, K, CL, CO2, BUN, CREATININE, GLUCOSE, CALCIUM in the last 72 hours. No results for input(s): LABPT, INR in the last 72 hours.  Physical Exam: Neurologically intact ABD soft Intact pulses distally Incision: dressing C/D/I Compartment soft no headache noted Body mass index is 39.27 kg/m.   Assessment/Plan: Patient stable  xrays n/a Continue mobilization with physical therapy Continue care  1. Patient s/p difficult decompression and in situ fusion complicated by CSF leak.  Primary repair of leak without evidence of issue at present.  Will elevate HOB and monitor for spinal headache. 2. If no spinal headache occurs then ambulate today 3. Possible d/c Saturday if cleared by PT/OT. 4. Will d/c foley  Melina Schools, MD [facility] [phone]

## 2018-11-23 NOTE — Evaluation (Signed)
Physical Therapy Evaluation Patient Details Name: Pamela Sims MRN: [id number] DOB: Aug 19, 1951 Today's Date: 11/23/2018   History of Present Illness  Pt is a 68 y/o female who presents s/p L3-S1 PLIF on 11/22/2018 (with intraoperative durotomy). PMH significant for HTN, hiatal hernia, HA, diverticulosi, asthma, anxiety, ADHD, B TKR, ACDF C4-C6 2015.  Clinical Impression  Pt admitted with above diagnosis. Pt currently with functional limitations due to the deficits listed below (see PT Problem List). At the time of PT eval pt was able to perform transfers and ambulation with gross min guard assist for balance support and safety throughout mobility. Tolerance for functional activity is limited at this time, and pt reports increased cramping sensation in hips during OOB. Pt unable to tolerate sitting up in recliner chair, and required return to supine for pain relief. RN present at end of session with pain medication and muscle relaxer. Anticipate pt will progress well as pain is better controlled. May progress and not require HHPT at d/c but based on performance today, feel it may be helpful. Pt will benefit from skilled PT to increase their independence and safety with mobility to allow discharge to the venue listed below.       Follow Up Recommendations Home health PT;Supervision for mobility/OOB    Equipment Recommendations  None recommended by PT    Recommendations for Other Services       Precautions / Restrictions Precautions Precautions: Fall;Back Precaution Booklet Issued: Yes (comment) Precaution Comments: Reviewed handout. Pt was cued for precautions during functional mobility.  Required Braces or Orthoses: Spinal Brace Spinal Brace: Lumbar corset;Applied in sitting position Restrictions Weight Bearing Restrictions: No      Mobility  Bed Mobility Overal bed mobility: Needs Assistance Bed Mobility: Rolling;Sidelying to Sit;Sit to Sidelying Rolling: Modified independent  (Device/Increase time) Sidelying to sit: Min guard     Sit to sidelying: Supervision General bed mobility comments: Supervision for safety with min guard at trunk during elevation to full sitting position.   Transfers Overall transfer level: Needs assistance Equipment used: Rolling walker (2 wheeled) Transfers: Sit to/from Stand Sit to Stand: Min guard         General transfer comment: Hands-on guarding provided for power-up to full stand. Pt slow and guarded with soft knees initially upon stand.   Ambulation/Gait Ambulation/Gait assistance: Min guard Gait Distance (Feet): 75 Feet Assistive device: Rolling walker (2 wheeled) Gait Pattern/deviations: Step-through pattern;Decreased stride length;Trunk flexed Gait velocity: Decreased Gait velocity interpretation: <1.8 ft/sec, indicate of risk for recurrent falls General Gait Details: Pt complaining of increased hip cramping L worse than R. Pt moving slow but generally steady with the RW.   Stairs            Wheelchair Mobility    Modified Rankin (Stroke Patients Only)       Balance Overall balance assessment: Needs assistance Sitting-balance support: No upper extremity supported;Feet supported Sitting balance-Leahy Scale: Fair     Standing balance support: No upper extremity supported;During functional activity Standing balance-Leahy Scale: Fair Standing balance comment: statically                             Pertinent Vitals/Pain      Home Living Family/patient expects to be discharged to:: Private residence   Available Help at Discharge: Family Type of Home: House Home Access: Stairs to enter   [employer] of Steps: 1(1 to porch, then 1 into house) Home Layout: Two level;Able to  live on main level with bedroom/bathroom Home Equipment: Gilford Rile - 2 wheels;Shower seat - built in;Hand held shower head      Prior Function Level of Independence: Independent               Electronics engineer        Extremity/Trunk Assessment   Upper Extremity Assessment Upper Extremity Assessment: Defer to OT evaluation    Lower Extremity Assessment Lower Extremity Assessment: Generalized weakness    Cervical / Trunk Assessment Cervical / Trunk Assessment: Other exceptions Cervical / Trunk Exceptions: s/p surgery  Communication   Communication: No difficulties  Cognition Arousal/Alertness: Awake/alert Behavior During Therapy: WFL for tasks assessed/performed Overall Cognitive Status: Within Functional Limits for tasks assessed                                        General Comments      Exercises     Assessment/Plan    PT Assessment Patient needs continued PT services  PT Problem List Decreased strength;Decreased activity tolerance;Decreased balance;Decreased mobility;Decreased knowledge of use of DME;Decreased safety awareness;Decreased knowledge of precautions;Pain       PT Treatment Interventions DME instruction;Gait training;Stair training;Functional mobility training;Therapeutic activities;Therapeutic exercise;Neuromuscular re-education;Patient/family education    PT Goals (Current goals can be found in the Care Plan section)  Acute Rehab PT Goals Patient Stated Goal: Decrease cramping in hips PT Goal Formulation: With patient Time For Goal Achievement: 12/07/18 Potential to Achieve Goals: Good    Frequency Min 3X/week   Barriers to discharge        Co-evaluation               AM-PAC PT "6 Clicks" Mobility  Outcome Measure Help needed turning from your back to your side while in a flat bed without using bedrails?: None Help needed moving from lying on your back to sitting on the side of a flat bed without using bedrails?: A Little Help needed moving to and from a bed to a chair (including a wheelchair)?: A Little Help needed standing up from a chair using your arms (e.g., wheelchair or bedside chair)?: A Little Help needed  to walk in hospital room?: A Little Help needed climbing 3-5 steps with a railing? : A Little 6 Click Score: 19    End of Session Equipment Utilized During Treatment: Gait belt;Back brace Activity Tolerance: Patient limited by pain Patient left: in bed;with call bell/phone within reach Nurse Communication: Mobility status PT Visit Diagnosis: Unsteadiness on feet (R26.81);Pain;Other symptoms and signs involving the nervous system (R29.898) Pain - Right/Left: Left Pain - part of body: Leg(and back)    Time: 9675-9163 PT Time Calculation (min) (ACUTE ONLY): 28 min   Charges:   PT Evaluation $PT Eval Moderate Complexity: 1 Mod PT Treatments $Gait Training: 8-22 mins        Rolinda Roan, PT, DPT Acute Rehabilitation Services Pager: [phone] Office: [phone]   Thelma Comp 11/23/2018, 10:57 AM

## 2018-11-23 NOTE — Anesthesia Postprocedure Evaluation (Signed)
Anesthesia Post Note  Patient: Pamela Sims  Procedure(s) Performed: Lumbar decompression with insitu fusion L3-S1 (N/A )     Patient location during evaluation: PACU Anesthesia Type: General Level of consciousness: awake and alert Pain management: pain level controlled Vital Signs Assessment: post-procedure vital signs reviewed and stable Respiratory status: spontaneous breathing, nonlabored ventilation, respiratory function stable and patient connected to nasal cannula oxygen Cardiovascular status: blood pressure returned to baseline and stable Postop Assessment: no apparent nausea or vomiting Anesthetic complications: no    Last Vitals:  Vitals:   11/23/18 0158 11/23/18 0759  BP: (!) 144/76 (!) 151/82  Pulse: 91 87  Resp: 18 16  Temp: 36.8 C   SpO2: 96% 95%    Last Pain:  Vitals:   11/23/18 0630  TempSrc:   PainSc: 7                  Latrease Kunde S

## 2018-11-23 NOTE — Evaluation (Signed)
Occupational Therapy Evaluation and Discharge Patient Details Name: Pamela Sims MRN: [id number] DOB: [birth year] Today's Date: 11/23/2018    History of Present Illness Pt is a 68 y/o female who presents s/p L3-S1 PLIF on 11/22/2018 (with intraoperative durotomy). PMH significant for HTN, hiatal hernia, HA, diverticulosi, asthma, anxiety, ADHD, B TKR, ACDF C4-C6 2015.   Clinical Impression   Pt with hip pain limiting ability to perform LB ADL independently as is typically. Educated in use of AE and techniques for completing ADL adhering to back precautions. Pt will have family available to assist with IADL. Her DME needs are met. Pt verbalizing understanding of all education. No further OT needs.    Follow Up Recommendations  No OT follow up    Equipment Recommendations  None recommended by OT    Recommendations for Other Services       Precautions / Restrictions Precautions Precautions: Fall;Back Precaution Booklet Issued: Yes (comment) Precaution Comments: reviewed back precautions related to ADL and IADL Required Braces or Orthoses: Spinal Brace Spinal Brace: Lumbar corset;Applied in sitting position Restrictions Weight Bearing Restrictions: No      Mobility Bed Mobility Overal bed mobility: Needs Assistance Bed Mobility: Rolling;Sidelying to Sit;Sit to Sidelying Rolling: Modified independent (Device/Increase time) Sidelying to sit: Min guard     Sit to sidelying: Supervision General bed mobility comments: Supervision for safety with min guard at trunk during elevation to full sitting position.   Transfers Overall transfer level: Needs assistance Equipment used: Rolling walker (2 wheeled) Transfers: Sit to/from Stand Sit to Stand: Min guard         General transfer comment: for safety, increased time and effort    Balance Overall balance assessment: Needs assistance Sitting-balance support: No upper extremity supported;Feet supported Sitting balance-Leahy  Scale: Fair     Standing balance support: No upper extremity supported;During functional activity Standing balance-Leahy Scale: Fair Standing balance comment: statically                           ADL either performed or assessed with clinical judgement   ADL Overall ADL's : Needs assistance/impaired Eating/Feeding: Independent;Sitting   Grooming: Min guard;Standing;Wash/dry hands Grooming Details (indicate cue type and reason): educated in two cup method for oral care Upper Body Bathing: Minimal assistance;Sitting Upper Body Bathing Details (indicate cue type and reason): recommended long handled bath sponge for reaching back Lower Body Bathing: Minimal assistance;Sit to/from stand Lower Body Bathing Details (indicate cue type and reason): recommended long handled bath sponge for reaching feet Upper Body Dressing : Set up;Sitting   Lower Body Dressing: Minimal assistance;Sit to/from stand Lower Body Dressing Details (indicate cue type and reason): can typically cross her foot over opposite knee, but pain limiting this visit, educated in use of reacher, pt has a sock aid Toilet Transfer: Min guard;Ambulation;RW Toilet Transfer Details (indicate cue type and reason): educated in use of 3 in 1 over toilet Toileting- Clothing Manipulation and Hygiene: Moderate assistance;Sit to/from stand Toileting - Clothing Manipulation Details (indicate cue type and reason): instructed in technique and use of toilet tongs for pericare, avoiding twisting     Functional mobility during ADLs: Min guard;Rolling walker General ADL Comments: Instructed in IADL to avoid during healing period, pt has her grandson and his girlfriend to assist.     Vision Patient Visual Report: No change from baseline       Perception     Praxis      Pertinent Vitals/Pain Pain Assessment:  Faces Faces Pain Scale: Hurts even more Pain Location: L hip Pain Descriptors / Indicators: Cramping Pain  Intervention(s): Monitored during session;Repositioned     Hand Dominance Right   Extremity/Trunk Assessment Upper Extremity Assessment Upper Extremity Assessment: Overall WFL for tasks assessed   Lower Extremity Assessment Lower Extremity Assessment: Defer to PT evaluation   Cervical / Trunk Assessment Cervical / Trunk Assessment: Other exceptions Cervical / Trunk Exceptions: s/p surgery   Communication Communication Communication: No difficulties   Cognition Arousal/Alertness: Awake/alert Behavior During Therapy: WFL for tasks assessed/performed Overall Cognitive Status: Within Functional Limits for tasks assessed                                     General Comments       Exercises     Shoulder Instructions      Home Living Family/patient expects to be discharged to:: Private residence Living Arrangements: Other relatives Available Help at Discharge: Family;Available PRN/intermittently Type of Home: House Home Access: Stairs to enter [employer] of Steps: 1(1 to porch, then 1 into house)   Home Layout: Two level;Able to live on main level with bedroom/bathroom     Bathroom Shower/Tub: Occupational psychologist: Standard     Home Equipment: Environmental consultant - 2 wheels;Shower seat - built in;Hand held shower head;Bedside commode;Adaptive equipment;Grab bars - tub/shower Adaptive Equipment: Sock aid        Prior Functioning/Environment Level of Independence: Independent        Comments: former Chief Technology Officer        OT Problem List:        OT Treatment/Interventions:      OT Goals(Current goals can be found in the care plan section) Acute Rehab OT Goals Patient Stated Goal: Decrease cramping in hips  OT Frequency:     Barriers to D/C:            Co-evaluation              AM-PAC OT "6 Clicks" Daily Activity     Outcome Measure Help from another person eating meals?: None Help from another person  taking care of personal grooming?: A Little Help from another person toileting, which includes using toliet, bedpan, or urinal?: A Little Help from another person bathing (including washing, rinsing, drying)?: A Little Help from another person to put on and taking off regular upper body clothing?: None Help from another person to put on and taking off regular lower body clothing?: A Little 6 Click Score: 20   End of Session Equipment Utilized During Treatment: Gait belt;Rolling walker;Back brace  Activity Tolerance: Patient tolerated treatment well Patient left: in bed;with call bell/phone within reach;with nursing/sitter in room;with family/visitor present  OT Visit Diagnosis: Other abnormalities of gait and mobility (R26.89);Pain                Time: 1660-6301 OT Time Calculation (min): 27 min Charges:  OT General Charges $OT Visit: 1 Visit OT Evaluation $OT Eval Low Complexity: 1 Low OT Treatments $Self Care/Home Management : 8-22 mins  Nestor Lewandowsky, OTR/L Acute Rehabilitation Services Pager: [phone] Office: [phone]  Pamela Sims 11/23/2018, 12:13 PM

## 2018-11-24 MED ORDER — FLEET ENEMA 7-19 GM/118ML RE ENEM
1.0000 | ENEMA | Freq: Once | RECTAL | Status: AC
  Administered 2018-11-24: 1 via RECTAL
  Filled 2018-11-24: qty 1

## 2018-11-24 MED ORDER — MAGNESIUM CITRATE PO SOLN
0.5000 | Freq: Once | ORAL | Status: AC
  Administered 2018-11-24: 0.5 via ORAL
  Filled 2018-11-24: qty 296

## 2018-11-24 MED ORDER — PREGABALIN 50 MG PO CAPS
75.0000 mg | ORAL_CAPSULE | Freq: Two times a day (BID) | ORAL | Status: DC
  Administered 2018-11-24 – 2018-11-25 (×3): 75 mg via ORAL
  Filled 2018-11-24 (×3): qty 1

## 2018-11-24 NOTE — Progress Notes (Signed)
    Subjective: Procedure(s) (LRB): Lumbar decompression with insitu fusion L3-S1 (N/A) 2 Days Post-Op  Patient reports pain as 3 on 0-10 scale.  Reports radicular leg pain improved - muscle spasm/crap in the right gluteal region slowly improving denies incisional back pain   Positive void Negative bowel movement Positive flatus Negative chest pain or shortness of breath  Objective: Vital signs in last 24 hours: Temp:  [97.6 F (36.4 C)-99.1 F (37.3 C)] 97.9 F (36.6 C) (02/29 0336) Pulse Rate:  [78-106] 78 (02/29 0336) Resp:  [16-20] 18 (02/29 0336) BP: (125-151)/(68-89) 130/89 (02/29 0336) SpO2:  [93 %-98 %] 94 % (02/29 0336)  Intake/Output from previous day: 02/28 0701 - 02/29 0700 In: 600 [P.O.:600] Out: -   Labs: No results for input(s): WBC, RBC, HCT, PLT in the last 72 hours. No results for input(s): NA, K, CL, CO2, BUN, CREATININE, GLUCOSE, CALCIUM in the last 72 hours. No results for input(s): LABPT, INR in the last 72 hours.  Physical Exam: Neurologically intact Intact pulses distally Incision: dressing C/D/I Compartment soft Body mass index is 39.27 kg/m.   Assessment/Plan: Patient stable  xrays n/a Continue mobilization - patient cleared by P/OT Continue care  Advance diet Up with therapy  1. Patient's primary complaint is the muscle cramping in the right gluteal region.  Although present it has improved since surgery.  Will hold off on d/c today to give additional time for improvement.  Cramping most likely secondary to residual nerve irritation and deconditioning.    2. Patient did not tolerate neurontin pre-op due to nausea - will change to lyrica. 3, Robaxin 750 more effective in managing cramping pain - will continue 4. Enema and mag citrate this AM for constipation. 5. Plan on d/c Sunday to home   Melina Schools, MD [facility] [phone]

## 2018-11-24 NOTE — Progress Notes (Signed)
Physical Therapy Treatment Patient Details Name: Pamela Sims MRN: [id number] DOB: [birth year] Today's Date: 11/24/2018    History of Present Illness Pt is a 68 y/o female who presents s/p L3-S1 PLIF on 11/22/2018 (with intraoperative durotomy). PMH significant for HTN, hiatal hernia, HA, diverticulosi, asthma, anxiety, ADHD, B TKR, ACDF C4-C6 2015.    PT Comments    Pt with continued right hip/glute cramping with radicular pain progressing distally to knee in addition to "tingling," radiating from left knee to foot. PT noted increased muscular tightness in right hip flexor. Initially focused on gentle proximal RLE stretching with muscle energy technique and soft tissue mobilization of right hip flexor. Pt with progression towards physical therapy goals, ambulating 150 feet with walker. Continues with gait abnormalities, decreased activity tolerance and weakness. Recommending HHPT at d/c.    Follow Up Recommendations  Home health PT;Supervision for mobility/OOB     Equipment Recommendations  None recommended by PT    Recommendations for Other Services       Precautions / Restrictions Precautions Precautions: Fall;Back Precaution Booklet Issued: Yes (comment) Precaution Comments: Reviewed handout. Pt was cued for precautions during functional mobility.  Required Braces or Orthoses: Spinal Brace Spinal Brace: Lumbar corset;Applied in sitting position Restrictions Weight Bearing Restrictions: No    Mobility  Bed Mobility Overal bed mobility: Needs Assistance Bed Mobility: Rolling;Sidelying to Sit;Sit to Sidelying Rolling: Modified independent (Device/Increase time) Sidelying to sit: Min guard     Sit to sidelying: Mod assist General bed mobility comments: modA for assisting BLE back into bed due to pain  Transfers Overall transfer level: Needs assistance Equipment used: Rolling walker (2 wheeled) Transfers: Sit to/from Stand Sit to Stand: Min guard         General  transfer comment: Cues for hand placement  Ambulation/Gait Ambulation/Gait assistance: Min guard Gait Distance (Feet): 150 Feet Assistive device: Rolling walker (2 wheeled) Gait Pattern/deviations: Step-through pattern;Decreased stride length;Antalgic;Trunk flexed;Decreased step length - left Gait velocity: Decreased   General Gait Details: Cues for decreased right step length, scapular depression   Stairs             Wheelchair Mobility    Modified Rankin (Stroke Patients Only)       Balance Overall balance assessment: Needs assistance Sitting-balance support: No upper extremity supported;Feet supported Sitting balance-Leahy Scale: Good     Standing balance support: No upper extremity supported;During functional activity Standing balance-Leahy Scale: Fair Standing balance comment: statically                            Cognition Arousal/Alertness: Awake/alert Behavior During Therapy: WFL for tasks assessed/performed Overall Cognitive Status: Within Functional Limits for tasks assessed                                        Exercises Other Exercises Other Exercises: Supine: manual stretching for RLE knee to chest, hamstring with muscle energy technique Other Exercises: L sidelying: soft tissue mobilization of right anterior hip flexor  Other Exercises: Instruction of self stretch for right hip flexor/extensor    General Comments        Pertinent Vitals/Pain Pain Assessment: Faces Faces Pain Scale: Hurts whole lot Pain Location: right hip Pain Descriptors / Indicators: Cramping Pain Intervention(s): Monitored during session;Limited activity within patient's tolerance;Repositioned    Home Living  Prior Function            PT Goals (current goals can now be found in the care plan section) Acute Rehab PT Goals Patient Stated Goal: Decrease cramping in hips PT Goal Formulation: With patient Time  For Goal Achievement: 12/07/18 Potential to Achieve Goals: Good Progress towards PT goals: Progressing toward goals    Frequency    Min 3X/week      PT Plan Current plan remains appropriate    Co-evaluation              AM-PAC PT "6 Clicks" Mobility   Outcome Measure  Help needed turning from your back to your side while in a flat bed without using bedrails?: None Help needed moving from lying on your back to sitting on the side of a flat bed without using bedrails?: A Little Help needed moving to and from a bed to a chair (including a wheelchair)?: A Little Help needed standing up from a chair using your arms (e.g., wheelchair or bedside chair)?: A Little Help needed to walk in hospital room?: A Little Help needed climbing 3-5 steps with a railing? : A Little 6 Click Score: 19    End of Session Equipment Utilized During Treatment: Back brace Activity Tolerance: Patient limited by pain Patient left: in bed;with call bell/phone within reach Nurse Communication: Mobility status PT Visit Diagnosis: Unsteadiness on feet (R26.81);Pain;Other symptoms and signs involving the nervous system (R29.898) Pain - Right/Left: Left Pain - part of body: Leg(and back)     Time: 5462-7035 PT Time Calculation (min) (ACUTE ONLY): 23 min  Charges:  $Gait Training: 8-22 mins $Therapeutic Exercise: 8-22 mins                     Ellamae Sia, PT, DPT Acute Rehabilitation Services Pager [phone] Office [phone]    Willy Eddy 11/24/2018, 9:08 AM

## 2018-11-24 NOTE — Progress Notes (Signed)
Report given to 4NP nurse. Patient alert and oriented, voiding adequately. No c/o pain but spasm which patient stated its getting better. Patient transferred to [id number]

## 2018-11-25 ENCOUNTER — Other Ambulatory Visit: Payer: Self-pay

## 2018-11-25 ENCOUNTER — Encounter (HOSPITAL_BASED_OUTPATIENT_CLINIC_OR_DEPARTMENT_OTHER): Payer: Self-pay | Admitting: Emergency Medicine

## 2018-11-25 ENCOUNTER — Emergency Department (HOSPITAL_BASED_OUTPATIENT_CLINIC_OR_DEPARTMENT_OTHER)
Admission: EM | Admit: 2018-11-25 | Discharge: 2018-11-25 | Disposition: A | Discharge status: Home / Self Care | Payer: Medicare Other | Source: Home / Self Care | Attending: Emergency Medicine | Admitting: Emergency Medicine

## 2018-11-25 ENCOUNTER — Emergency Department (HOSPITAL_BASED_OUTPATIENT_CLINIC_OR_DEPARTMENT_OTHER): Payer: Medicare Other

## 2018-11-25 DIAGNOSIS — R400 Somnolence: Secondary | ICD-10-CM

## 2018-11-25 DIAGNOSIS — F329 Major depressive disorder, single episode, unspecified: Secondary | ICD-10-CM

## 2018-11-25 DIAGNOSIS — Z87891 Personal history of nicotine dependence: Secondary | ICD-10-CM | POA: Insufficient documentation

## 2018-11-25 DIAGNOSIS — Z79899 Other long term (current) drug therapy: Secondary | ICD-10-CM

## 2018-11-25 DIAGNOSIS — F419 Anxiety disorder, unspecified: Secondary | ICD-10-CM | POA: Insufficient documentation

## 2018-11-25 DIAGNOSIS — W010XXA Fall on same level from slipping, tripping and stumbling without subsequent striking against object, initial encounter: Secondary | ICD-10-CM | POA: Insufficient documentation

## 2018-11-25 DIAGNOSIS — J45909 Unspecified asthma, uncomplicated: Secondary | ICD-10-CM | POA: Insufficient documentation

## 2018-11-25 DIAGNOSIS — R252 Cramp and spasm: Secondary | ICD-10-CM | POA: Insufficient documentation

## 2018-11-25 DIAGNOSIS — Z9049 Acquired absence of other specified parts of digestive tract: Secondary | ICD-10-CM

## 2018-11-25 DIAGNOSIS — Y92002 Bathroom of unspecified non-institutional (private) residence single-family (private) house as the place of occurrence of the external cause: Secondary | ICD-10-CM

## 2018-11-25 DIAGNOSIS — F909 Attention-deficit hyperactivity disorder, unspecified type: Secondary | ICD-10-CM

## 2018-11-25 DIAGNOSIS — Z7982 Long term (current) use of aspirin: Secondary | ICD-10-CM

## 2018-11-25 DIAGNOSIS — Y9389 Activity, other specified: Secondary | ICD-10-CM | POA: Insufficient documentation

## 2018-11-25 DIAGNOSIS — W19XXXA Unspecified fall, initial encounter: Secondary | ICD-10-CM

## 2018-11-25 DIAGNOSIS — Y998 Other external cause status: Secondary | ICD-10-CM

## 2018-11-25 DIAGNOSIS — I10 Essential (primary) hypertension: Secondary | ICD-10-CM | POA: Insufficient documentation

## 2018-11-25 DIAGNOSIS — M549 Dorsalgia, unspecified: Secondary | ICD-10-CM

## 2018-11-25 DIAGNOSIS — M96840 Postprocedural hematoma of a musculoskeletal structure following a musculoskeletal system procedure: Secondary | ICD-10-CM | POA: Diagnosis not present

## 2018-11-25 DIAGNOSIS — Z96653 Presence of artificial knee joint, bilateral: Secondary | ICD-10-CM | POA: Insufficient documentation

## 2018-11-25 LAB — CBC WITH DIFFERENTIAL/PLATELET
Abs Immature Granulocytes: 0.05 10*3/uL (ref 0.00–0.07)
Basophils Absolute: 0 10*3/uL (ref 0.0–0.1)
Basophils Relative: 0 %
Eosinophils Absolute: 0.1 10*3/uL (ref 0.0–0.5)
Eosinophils Relative: 1 %
HCT: 30.2 % — ABNORMAL LOW (ref 36.0–46.0)
Hemoglobin: 10.2 g/dL — ABNORMAL LOW (ref 12.0–15.0)
Immature Granulocytes: 1 %
Lymphocytes Relative: 8 %
Lymphs Abs: 0.7 10*3/uL (ref 0.7–4.0)
MCH: 28.7 pg (ref 26.0–34.0)
MCHC: 33.8 g/dL (ref 30.0–36.0)
MCV: 85.1 fL (ref 80.0–100.0)
Monocytes Absolute: 0.8 10*3/uL (ref 0.1–1.0)
Monocytes Relative: 9 %
Neutro Abs: 6.6 10*3/uL (ref 1.7–7.7)
Neutrophils Relative %: 81 %
Platelets: 159 10*3/uL (ref 150–400)
RBC: 3.55 MIL/uL — ABNORMAL LOW (ref 3.87–5.11)
RDW: 13.5 % (ref 11.5–15.5)
WBC: 8.1 10*3/uL (ref 4.0–10.5)
nRBC: 0 % (ref 0.0–0.2)

## 2018-11-25 LAB — BASIC METABOLIC PANEL
Anion gap: 6 (ref 5–15)
BUN: 14 mg/dL (ref 8–23)
CO2: 27 mmol/L (ref 22–32)
Calcium: 8.3 mg/dL — ABNORMAL LOW (ref 8.9–10.3)
Chloride: 97 mmol/L — ABNORMAL LOW (ref 98–111)
Creatinine, Ser: 0.69 mg/dL (ref 0.44–1.00)
GFR calc Af Amer: >60 mL/min (ref >60)
GFR calc non Af Amer: >60 mL/min (ref >60)
Glucose, Bld: 115 mg/dL — ABNORMAL HIGH (ref 70–99)
Potassium: 3.7 mmol/L (ref 3.5–5.1)
Sodium: 130 mmol/L — ABNORMAL LOW (ref 135–145)

## 2018-11-25 MED ORDER — DOCUSATE SODIUM 100 MG PO CAPS: 100.0000 mg | ORAL_CAPSULE | Freq: Two times a day (BID) | ORAL | 0 refills | Status: DC

## 2018-11-25 MED ORDER — ACETAMINOPHEN 500 MG PO TABS: 1000.0000 mg | ORAL_TABLET | Freq: Three times a day (TID) | ORAL | 0 refills | Status: DC

## 2018-11-25 MED ORDER — POLYETHYLENE GLYCOL 3350 17 G PO PACK: 17.0000 g | PACK | Freq: Every day | ORAL | 0 refills | Status: DC | PRN

## 2018-11-25 MED ORDER — DIAZEPAM 2 MG PO TABS
2.0000 mg | ORAL_TABLET | Freq: Four times a day (QID) | ORAL | Status: DC | PRN
  Filled 2018-11-25 (×2): qty 1

## 2018-11-25 MED ORDER — FENTANYL CITRATE (PF) 100 MCG/2ML IJ SOLN
25.0000 ug | Freq: Once | INTRAMUSCULAR | Status: DC
  Filled 2018-11-25: qty 2

## 2018-11-25 MED ORDER — METHOCARBAMOL 750 MG PO TABS: 750.0000 mg | ORAL_TABLET | Freq: Four times a day (QID) | ORAL | 0 refills | Status: DC | PRN

## 2018-11-25 MED ORDER — FENTANYL CITRATE (PF) 100 MCG/2ML IJ SOLN
25.0000 ug | Freq: Once | INTRAMUSCULAR | Status: AC
  Administered 2018-11-25: 25 ug via INTRAVENOUS

## 2018-11-25 MED ORDER — OXYCODONE HCL 5 MG PO TABS: 5.0000 mg | ORAL_TABLET | ORAL | 0 refills | Status: DC | PRN

## 2018-11-25 NOTE — ED Notes (Signed)
ED Provider at bedside. 

## 2018-11-25 NOTE — ED Notes (Signed)
Pt ambulated using walker (as she has walker at home). Pt able to use walker unassisted, in NAD with regular and steady gait.
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
## 2018-11-25 NOTE — Discharge Instructions (Signed)
Please call your back doctor tomorrow and inform them of your visit to the emergency department.  These make an appointment for follow-up for further evaluation with them.  Return to the emergency department immediately if you experience any back pain associated with fevers, loss of control of your bowels/bladder, weakness/numbness to your legs, numbness to your groin area, inability to walk, or inability to urinate.

## 2018-11-25 NOTE — ED Notes (Signed)
Informed PA (Courtni) that Ultrasound is not here at this time.

## 2018-11-25 NOTE — Progress Notes (Addendum)
Patient ID: Pamela Sims, female   DOB: 1951/03/07, 68 y.o.   MRN: [id number] Subjective: 3 Days Post-Op Procedure(s) (LRB): Lumbar decompression with insitu fusion L3-S1 (N/A)    Patient reports pain as moderate.  Still having a lot of cramps that she reports last about an hour.  Has mobilized well with nursing but then hurts, cramps after  Objective:   VITALS:   Vitals:   11/24/18 2300 11/25/18 0300  BP: 112/84 (!) 142/77  Pulse: 94 87  Resp:    Temp: 99.4 F (37.4 C) 99.4 F (37.4 C)  SpO2: 92% 91%    Incision: dressing C/D/I  LABS No results for input(s): HGB, HCT, WBC, PLT in the last 72 hours.  No results for input(s): NA, K, BUN, CREATININE, GLUCOSE in the last 72 hours.  No results for input(s): LABPT, INR in the last 72 hours.   Assessment/Plan: 3 Days Post-Op Procedure(s) (LRB): Lumbar decompression with insitu fusion L3-S1 (N/A)   Up with therapy today  Home today  RTC per [facility]  Changed robaxin to valium for a dose while here in the hospital.  OK to d/c home on robaxin.

## 2018-11-25 NOTE — Progress Notes (Signed)
Physical Therapy Treatment Patient Details Name: Pamela Sims MRN: [id number] DOB: 09-23-1951 Today's Date: 11/25/2018    History of Present Illness Pt is a 68 y/o female who presents s/p L3-S1 PLIF on 11/22/2018 (with intraoperative durotomy). PMH significant for HTN, hiatal hernia, HA, diverticulosi, asthma, anxiety, ADHD, B TKR, ACDF C4-C6 2015.    PT Comments    Overall pt is moving well with little to no assistance needed. Increased time needed for all mobility due to LE/back pain/cramping. Pt reports when the legs/hip cramp it makes her feet tingle. RN notified of today's mobility and pt reports. Acute PT to continue during pt's hospital stay.    Follow Up Recommendations  Home health PT;Supervision for mobility/OOB     Equipment Recommendations  None recommended by PT    Precautions / Restrictions Precautions Precautions: Fall;Back Precaution Comments: recall 3/3 back precautions. reminder cues with session to adhere to them Required Braces or Orthoses: Spinal Brace Spinal Brace: Lumbar corset;Applied in sitting position(pt able to self don/doff brace at edge of bed)    Mobility  Bed Mobility Overal bed mobility: Needs Assistance Bed Mobility: Rolling;Sidelying to Sit;Sit to Sidelying Rolling: Modified independent (Device/Increase time) Sidelying to sit: Supervision     Sit to sidelying: Supervision General bed mobility comments: with bed flat, no rails. cues on technique, increased time needed due to pain/cramping in lower extremities.   Transfers Overall transfer level: Needs assistance Equipment used: Rolling walker (2 wheeled) Transfers: Sit to/from Stand Sit to Stand: Supervision         General transfer comment: demo's good hand placement. cues for weight shifting and to power through LE's with standing. performed x2 reps at edge of bed.  Ambulation/Gait Ambulation/Gait assistance: Supervision Gait Distance (Feet): 80 Feet Assistive device: Rolling  walker (2 wheeled) Gait Pattern/deviations: Step-through pattern;Decreased stride length;Narrow base of support Gait velocity: Decreased Gait velocity interpretation: <1.8 ft/sec, indicate of risk for recurrent falls General Gait Details: pt with heavy reliance on RW. cues to relax shoulders and for incr step lenght with bil LE's.    Stairs      Cognition Arousal/Alertness: Awake/alert Behavior During Therapy: WFL for tasks assessed/performed Overall Cognitive Status: Within Functional Limits for tasks assessed               Exercises Other Exercises Other Exercises: Supine: manual stretching for RLE knee to chest, hamstring with muscle energy technique. bil heel cord stretching as well. all prior to edge of bed. Other Exercises: at edge of bed: heel/toe raises, then long arc quads for increased muscle movements/stretching.      Pertinent Vitals/Pain Pain Assessment: 0-10 Pain Score: 9  Pain Location: right hip Pain Descriptors / Indicators: Cramping Pain Intervention(s): Limited activity within patient's tolerance;Monitored during session;Premedicated before session;Repositioned;Utilized relaxation techniques     PT Goals (current goals can now be found in the care plan section) Acute Rehab PT Goals Patient Stated Goal: Decrease cramping in hips PT Goal Formulation: With patient Time For Goal Achievement: 12/07/18 Potential to Achieve Goals: Good Progress towards PT goals: Progressing toward goals    Frequency    Min 3X/week      PT Plan Current plan remains appropriate    AM-PAC PT "6 Clicks" Mobility   Outcome Measure  Help needed turning from your back to your side while in a flat bed without using bedrails?: None Help needed moving from lying on your back to sitting on the side of a flat bed without using bedrails?: None Help needed  moving to and from a bed to a chair (including a wheelchair)?: None Help needed standing up from a chair using your arms  (e.g., wheelchair or bedside chair)?: None Help needed to walk in hospital room?: None Help needed climbing 3-5 steps with a railing? : A Little 6 Click Score: 23    End of Session Equipment Utilized During Treatment: Back brace Activity Tolerance: Patient limited by pain Patient left: in bed;with call bell/phone within reach Nurse Communication: Mobility status PT Visit Diagnosis: Unsteadiness on feet (R26.81);Pain;Other symptoms and signs involving the nervous system (R29.898) Pain - part of body: Leg(and back)     Time: 2924-4628 PT Time Calculation (min) (ACUTE ONLY): 26 min  Charges:  $Gait Training: 8-22 mins $Therapeutic Activity: 8-22 mins                    Willow Ora, PTA, CLT Acute [employer] [phone] 11/25/18, 8:35 AM   Willow Ora 11/25/2018, 8:33 AM

## 2018-11-26 ENCOUNTER — Encounter (HOSPITAL_COMMUNITY): Payer: Self-pay

## 2018-11-26 ENCOUNTER — Inpatient Hospital Stay (HOSPITAL_COMMUNITY): Payer: Medicare Other | Admitting: Anesthesiology

## 2018-11-26 ENCOUNTER — Inpatient Hospital Stay (HOSPITAL_COMMUNITY)
Admission: AD | Admit: 2018-11-26 | Discharge: 2018-11-30 | DRG: 983 | Disposition: A | Discharge status: Skilled Nursing Facility | Payer: Medicare Other | Attending: Orthopedic Surgery | Admitting: Orthopedic Surgery

## 2018-11-26 ENCOUNTER — Inpatient Hospital Stay (HOSPITAL_COMMUNITY)
Admission: AD | Disposition: A | Discharge status: Skilled Nursing Facility | Payer: Self-pay | Source: Home / Self Care | Attending: Orthopedic Surgery

## 2018-11-26 DIAGNOSIS — I10 Essential (primary) hypertension: Secondary | ICD-10-CM | POA: Diagnosis present

## 2018-11-26 DIAGNOSIS — Z96651 Presence of right artificial knee joint: Secondary | ICD-10-CM | POA: Diagnosis present

## 2018-11-26 DIAGNOSIS — Z87891 Personal history of nicotine dependence: Secondary | ICD-10-CM | POA: Diagnosis not present

## 2018-11-26 DIAGNOSIS — T148XXA Other injury of unspecified body region, initial encounter: Secondary | ICD-10-CM | POA: Diagnosis present

## 2018-11-26 DIAGNOSIS — W19XXXA Unspecified fall, initial encounter: Secondary | ICD-10-CM | POA: Diagnosis present

## 2018-11-26 DIAGNOSIS — Z6839 Body mass index (BMI) 39.0-39.9, adult: Secondary | ICD-10-CM | POA: Diagnosis not present

## 2018-11-26 DIAGNOSIS — S300XXA Contusion of lower back and pelvis, initial encounter: Secondary | ICD-10-CM | POA: Diagnosis present

## 2018-11-26 DIAGNOSIS — Y92009 Unspecified place in unspecified non-institutional (private) residence as the place of occurrence of the external cause: Secondary | ICD-10-CM | POA: Diagnosis not present

## 2018-11-26 DIAGNOSIS — M96840 Postprocedural hematoma of a musculoskeletal structure following a musculoskeletal system procedure: Principal | ICD-10-CM | POA: Diagnosis present

## 2018-11-26 DIAGNOSIS — E669 Obesity, unspecified: Secondary | ICD-10-CM | POA: Diagnosis present

## 2018-11-26 DIAGNOSIS — Z981 Arthrodesis status: Secondary | ICD-10-CM

## 2018-11-26 DIAGNOSIS — R05 Cough: Secondary | ICD-10-CM

## 2018-11-26 DIAGNOSIS — R058 Other specified cough: Secondary | ICD-10-CM

## 2018-11-26 DIAGNOSIS — F3289 Other specified depressive episodes: Secondary | ICD-10-CM | POA: Diagnosis not present

## 2018-11-26 DIAGNOSIS — R443 Hallucinations, unspecified: Secondary | ICD-10-CM | POA: Diagnosis not present

## 2018-11-26 HISTORY — DX: Other injury of unspecified body region, initial encounter: T14.8XXA

## 2018-11-26 HISTORY — PX: LUMBAR WOUND DEBRIDEMENT: SHX1988

## 2018-11-26 LAB — CBC
HCT: 31.6 % — ABNORMAL LOW (ref 36.0–46.0)
Hemoglobin: 10.7 g/dL — ABNORMAL LOW (ref 12.0–15.0)
MCH: 28.2 pg (ref 26.0–34.0)
MCHC: 33.9 g/dL (ref 30.0–36.0)
MCV: 83.2 fL (ref 80.0–100.0)
Platelets: 192 10*3/uL (ref 150–400)
RBC: 3.8 MIL/uL — ABNORMAL LOW (ref 3.87–5.11)
RDW: 13.2 % (ref 11.5–15.5)
WBC: 9.1 10*3/uL (ref 4.0–10.5)
nRBC: 0 % (ref 0.0–0.2)

## 2018-11-26 LAB — PROTIME-INR
INR: 1.1 (ref 0.8–1.2)
Prothrombin Time: 14.3 seconds (ref 11.4–15.2)

## 2018-11-26 LAB — APTT: aPTT: 32 seconds (ref 24–36)

## 2018-11-26 SURGERY — LUMBAR WOUND DEBRIDEMENT
Anesthesia: General | Site: Spine Lumbar

## 2018-11-26 MED ORDER — OXYCODONE HCL 5 MG PO TABS: 10.0000 mg | ORAL_TABLET | ORAL | Status: DC | PRN

## 2018-11-26 MED ORDER — POLYETHYLENE GLYCOL 3350 17 G PO PACK
17.0000 g | PACK | Freq: Every day | ORAL | Status: DC | PRN
  Administered 2018-11-27 – 2018-11-30 (×2): 17 g via ORAL
  Filled 2018-11-26 (×2): qty 1

## 2018-11-26 MED ORDER — ACETAMINOPHEN 325 MG PO TABS
650.0000 mg | ORAL_TABLET | ORAL | Status: DC | PRN
  Administered 2018-11-28 – 2018-11-30 (×7): 650 mg via ORAL
  Filled 2018-11-26 (×7): qty 2

## 2018-11-26 MED ORDER — OXYCODONE HCL 5 MG/5ML PO SOLN: 5.0000 mg | Freq: Once | ORAL | Status: AC | PRN

## 2018-11-26 MED ORDER — PREGABALIN 25 MG PO CAPS
75.0000 mg | ORAL_CAPSULE | Freq: Two times a day (BID) | ORAL | Status: DC
  Administered 2018-11-27: 75 mg via ORAL
  Filled 2018-11-26: qty 3

## 2018-11-26 MED ORDER — LACTATED RINGERS IV SOLN
INTRAVENOUS | Status: DC
  Administered 2018-11-27: via INTRAVENOUS

## 2018-11-26 MED ORDER — SODIUM CHLORIDE 0.9 % IV SOLN: 250.0000 mL | INTRAVENOUS | Status: DC

## 2018-11-26 MED ORDER — FENTANYL CITRATE (PF) 250 MCG/5ML IJ SOLN
INTRAMUSCULAR | Status: DC | PRN
  Administered 2018-11-26: 100 ug via INTRAVENOUS

## 2018-11-26 MED ORDER — PHENYLEPHRINE 40 MCG/ML (10ML) SYRINGE FOR IV PUSH (FOR BLOOD PRESSURE SUPPORT)
PREFILLED_SYRINGE | INTRAVENOUS | Status: DC | PRN
  Administered 2018-11-26 (×2): 120 ug via INTRAVENOUS
  Administered 2018-11-26: 80 ug via INTRAVENOUS
  Administered 2018-11-26 (×2): 120 ug via INTRAVENOUS
  Administered 2018-11-26: 80 ug via INTRAVENOUS

## 2018-11-26 MED ORDER — BUPIVACAINE HCL (PF) 0.25 % IJ SOLN
INTRAMUSCULAR | Status: AC
  Filled 2018-11-26: qty 30

## 2018-11-26 MED ORDER — SUCCINYLCHOLINE CHLORIDE 200 MG/10ML IV SOSY
PREFILLED_SYRINGE | INTRAVENOUS | Status: DC | PRN
  Administered 2018-11-26: 140 mg via INTRAVENOUS

## 2018-11-26 MED ORDER — OXYCODONE HCL 5 MG PO TABS
ORAL_TABLET | ORAL | Status: AC
  Filled 2018-11-26: qty 1

## 2018-11-26 MED ORDER — ONDANSETRON HCL 4 MG PO TABS: 4.0000 mg | ORAL_TABLET | Freq: Four times a day (QID) | ORAL | Status: DC | PRN

## 2018-11-26 MED ORDER — ONDANSETRON HCL 4 MG/2ML IJ SOLN: 4.0000 mg | Freq: Four times a day (QID) | INTRAMUSCULAR | Status: DC | PRN

## 2018-11-26 MED ORDER — MIDAZOLAM HCL 2 MG/2ML IJ SOLN
INTRAMUSCULAR | Status: AC
  Filled 2018-11-26: qty 2

## 2018-11-26 MED ORDER — LACTATED RINGERS IV SOLN
INTRAVENOUS | Status: DC | PRN
  Administered 2018-11-26 (×2): via INTRAVENOUS

## 2018-11-26 MED ORDER — ONDANSETRON HCL 4 MG/2ML IJ SOLN
4.0000 mg | Freq: Four times a day (QID) | INTRAMUSCULAR | Status: DC
  Administered 2018-11-27 – 2018-11-28 (×6): 4 mg via INTRAVENOUS
  Filled 2018-11-26 (×6): qty 2

## 2018-11-26 MED ORDER — CEFAZOLIN SODIUM-DEXTROSE 1-4 GM/50ML-% IV SOLN
1.0000 g | Freq: Three times a day (TID) | INTRAVENOUS | Status: AC
  Administered 2018-11-27 – 2018-11-29 (×9): 1 g via INTRAVENOUS
  Filled 2018-11-26 (×9): qty 50

## 2018-11-26 MED ORDER — MENTHOL 3 MG MT LOZG
1.0000 | LOZENGE | OROMUCOSAL | Status: DC | PRN
  Filled 2018-11-26: qty 9

## 2018-11-26 MED ORDER — FENTANYL CITRATE (PF) 100 MCG/2ML IJ SOLN: 25.0000 ug | INTRAMUSCULAR | Status: DC | PRN

## 2018-11-26 MED ORDER — METHOCARBAMOL 750 MG PO TABS: 750.0000 mg | ORAL_TABLET | Freq: Four times a day (QID) | ORAL | Status: DC | PRN

## 2018-11-26 MED ORDER — PHENOL 1.4 % MT LIQD
1.0000 | OROMUCOSAL | Status: DC | PRN
  Administered 2018-11-27: 1 via OROMUCOSAL
  Filled 2018-11-26: qty 177

## 2018-11-26 MED ORDER — THROMBIN 20000 UNITS EX SOLR
CUTANEOUS | Status: DC | PRN
  Administered 2018-11-26: 20000 [IU] via TOPICAL

## 2018-11-26 MED ORDER — HYDROMORPHONE HCL 1 MG/ML IJ SOLN: 1.0000 mg | INTRAMUSCULAR | Status: DC | PRN

## 2018-11-26 MED ORDER — AMLODIPINE BESYLATE 10 MG PO TABS
10.0000 mg | ORAL_TABLET | Freq: Every day | ORAL | Status: DC
  Administered 2018-11-27 – 2018-11-30 (×4): 10 mg via ORAL
  Filled 2018-11-26 (×4): qty 1

## 2018-11-26 MED ORDER — PROPOFOL 10 MG/ML IV BOLUS
INTRAVENOUS | Status: DC | PRN
  Administered 2018-11-26: 160 mg via INTRAVENOUS

## 2018-11-26 MED ORDER — OXYCODONE HCL 5 MG PO TABS
5.0000 mg | ORAL_TABLET | ORAL | Status: DC | PRN
  Administered 2018-11-29: 5 mg via ORAL
  Filled 2018-11-26: qty 1

## 2018-11-26 MED ORDER — LACTATED RINGERS IV SOLN
INTRAVENOUS | Status: DC
  Administered 2018-11-26: 17:00:00 via INTRAVENOUS

## 2018-11-26 MED ORDER — SODIUM CHLORIDE 0.9% FLUSH
3.0000 mL | Freq: Two times a day (BID) | INTRAVENOUS | Status: DC
  Administered 2018-11-26 – 2018-11-30 (×8): 3 mL via INTRAVENOUS

## 2018-11-26 MED ORDER — DULOXETINE HCL 30 MG PO CPEP
30.0000 mg | ORAL_CAPSULE | Freq: Every day | ORAL | Status: DC
  Administered 2018-11-27 – 2018-11-30 (×4): 30 mg via ORAL
  Filled 2018-11-26 (×4): qty 1

## 2018-11-26 MED ORDER — OXYCODONE HCL 5 MG PO TABS
5.0000 mg | ORAL_TABLET | Freq: Once | ORAL | Status: AC | PRN
  Administered 2018-11-26: 5 mg via ORAL

## 2018-11-26 MED ORDER — MIDAZOLAM HCL 2 MG/2ML IJ SOLN
INTRAMUSCULAR | Status: DC | PRN
  Administered 2018-11-26: 2 mg via INTRAVENOUS

## 2018-11-26 MED ORDER — EPINEPHRINE PF 1 MG/ML IJ SOLN
INTRAMUSCULAR | Status: AC
  Filled 2018-11-26: qty 1

## 2018-11-26 MED ORDER — EPHEDRINE SULFATE-NACL 50-0.9 MG/10ML-% IV SOSY
PREFILLED_SYRINGE | INTRAVENOUS | Status: DC | PRN
  Administered 2018-11-26 (×2): 10 mg via INTRAVENOUS

## 2018-11-26 MED ORDER — METHOCARBAMOL 1000 MG/10ML IJ SOLN
500.0000 mg | Freq: Four times a day (QID) | INTRAVENOUS | Status: DC | PRN
  Administered 2018-11-26 – 2018-11-28 (×4): 500 mg via INTRAVENOUS
  Filled 2018-11-26: qty 500
  Filled 2018-11-26 (×6): qty 5

## 2018-11-26 MED ORDER — SUGAMMADEX SODIUM 200 MG/2ML IV SOLN
INTRAVENOUS | Status: DC | PRN
  Administered 2018-11-26: 200 mg via INTRAVENOUS

## 2018-11-26 MED ORDER — ACETAMINOPHEN 650 MG RE SUPP: 650.0000 mg | RECTAL | Status: DC | PRN

## 2018-11-26 MED ORDER — PROPOFOL 10 MG/ML IV BOLUS
INTRAVENOUS | Status: AC
  Filled 2018-11-26: qty 20

## 2018-11-26 MED ORDER — CEFAZOLIN SODIUM-DEXTROSE 2-4 GM/100ML-% IV SOLN
2.0000 g | INTRAVENOUS | Status: AC
  Administered 2018-11-26: 2 g via INTRAVENOUS
  Filled 2018-11-26: qty 100

## 2018-11-26 MED ORDER — MORPHINE SULFATE (PF) 2 MG/ML IV SOLN
2.0000 mg | INTRAVENOUS | Status: DC | PRN
  Administered 2018-11-26: 2 mg via INTRAVENOUS
  Filled 2018-11-26: qty 1

## 2018-11-26 MED ORDER — FENTANYL CITRATE (PF) 250 MCG/5ML IJ SOLN
INTRAMUSCULAR | Status: AC
  Filled 2018-11-26: qty 5

## 2018-11-26 MED ORDER — THROMBIN (RECOMBINANT) 20000 UNITS EX SOLR
CUTANEOUS | Status: AC
  Filled 2018-11-26: qty 20000

## 2018-11-26 MED ORDER — SODIUM CHLORIDE 0.9% FLUSH: 3.0000 mL | INTRAVENOUS | Status: DC | PRN

## 2018-11-26 MED ORDER — ROCURONIUM BROMIDE 10 MG/ML (PF) SYRINGE
PREFILLED_SYRINGE | INTRAVENOUS | Status: DC | PRN
  Administered 2018-11-26: 50 mg via INTRAVENOUS

## 2018-11-26 MED ORDER — ONDANSETRON HCL 4 MG/2ML IJ SOLN: 4.0000 mg | Freq: Once | INTRAMUSCULAR | Status: DC | PRN

## 2018-11-26 MED ORDER — DEXAMETHASONE SODIUM PHOSPHATE 10 MG/ML IJ SOLN
INTRAMUSCULAR | Status: DC | PRN
  Administered 2018-11-26: 10 mg via INTRAVENOUS

## 2018-11-26 MED ORDER — ONDANSETRON HCL 4 MG/2ML IJ SOLN
INTRAMUSCULAR | Status: DC | PRN
  Administered 2018-11-26: 4 mg via INTRAVENOUS

## 2018-11-26 MED ORDER — TRANEXAMIC ACID-NACL 1000-0.7 MG/100ML-% IV SOLN
1000.0000 mg | INTRAVENOUS | Status: AC
  Administered 2018-11-26: 1000 mg via INTRAVENOUS
  Filled 2018-11-26: qty 100

## 2018-11-26 SURGICAL SUPPLY — 61 items
BUR EGG ELITE 4.0 (BURR) IMPLANT
CANISTER SUCT 3000ML PPV (MISCELLANEOUS) ×2 IMPLANT
CLSR STERI-STRIP ANTIMIC 1/2X4 (GAUZE/BANDAGES/DRESSINGS) ×2 IMPLANT
CORDS BIPOLAR (ELECTRODE) ×2 IMPLANT
COVER SURGICAL LIGHT HANDLE (MISCELLANEOUS) ×2 IMPLANT
COVER WAND RF STERILE (DRAPES) ×2 IMPLANT
DRAIN CHANNEL 15F RND FF W/TCR (WOUND CARE) ×2 IMPLANT
DRAPE HALF SHEET 40X57 (DRAPES) ×2 IMPLANT
DRAPE POUCH INSTRU U-SHP 10X18 (DRAPES) ×2 IMPLANT
DRAPE SURG 17X23 STRL (DRAPES) ×2 IMPLANT
DRAPE U-SHAPE 47X51 STRL (DRAPES) ×2 IMPLANT
DRSG AQUACEL AG ADV 3.5X10 (GAUZE/BANDAGES/DRESSINGS) ×2 IMPLANT
DRSG OPSITE POSTOP 4X8 (GAUZE/BANDAGES/DRESSINGS) ×2 IMPLANT
DURAPREP 26ML APPLICATOR (WOUND CARE) ×2 IMPLANT
ELECT BLADE 4.0 EZ CLEAN MEGAD (MISCELLANEOUS)
ELECT CAUTERY BLADE 6.4 (BLADE) ×2 IMPLANT
ELECT PENCIL ROCKER SW 15FT (MISCELLANEOUS) ×2 IMPLANT
ELECT REM PT RETURN 9FT ADLT (ELECTROSURGICAL) ×2
ELECTRODE BLDE 4.0 EZ CLN MEGD (MISCELLANEOUS) IMPLANT
ELECTRODE REM PT RTRN 9FT ADLT (ELECTROSURGICAL) ×1 IMPLANT
EVACUATOR 1/8 PVC DRAIN (DRAIN) IMPLANT
EVACUATOR SILICONE 100CC (DRAIN) ×2 IMPLANT
GAUZE SPONGE 4X4 12PLY STRL (GAUZE/BANDAGES/DRESSINGS) ×2 IMPLANT
GLOVE BIO SURGEON STRL SZ 6.5 (GLOVE) ×2 IMPLANT
GLOVE BIOGEL PI IND STRL 6.5 (GLOVE) ×1 IMPLANT
GLOVE BIOGEL PI IND STRL 8.5 (GLOVE) ×1 IMPLANT
GLOVE BIOGEL PI INDICATOR 6.5 (GLOVE) ×1
GLOVE BIOGEL PI INDICATOR 8.5 (GLOVE) ×1
GLOVE SS BIOGEL STRL SZ 8.5 (GLOVE) ×1 IMPLANT
GLOVE SUPERSENSE BIOGEL SZ 8.5 (GLOVE) ×1
GOWN STRL REUS W/ TWL LRG LVL3 (GOWN DISPOSABLE) ×1 IMPLANT
GOWN STRL REUS W/TWL 2XL LVL3 (GOWN DISPOSABLE) ×4 IMPLANT
GOWN STRL REUS W/TWL LRG LVL3 (GOWN DISPOSABLE) ×1
HEMOSTAT ARISTA ABSORB 3G PWDR (HEMOSTASIS) ×2 IMPLANT
KIT BASIN OR (CUSTOM PROCEDURE TRAY) ×2 IMPLANT
KIT TURNOVER KIT B (KITS) ×2 IMPLANT
NEEDLE 22X1 1/2 (OR ONLY) (NEEDLE) ×2 IMPLANT
NEEDLE SPNL 18GX3.5 QUINCKE PK (NEEDLE) ×4 IMPLANT
NS IRRIG 1000ML POUR BTL (IV SOLUTION) ×2 IMPLANT
PACK LAMINECTOMY ORTHO (CUSTOM PROCEDURE TRAY) ×2 IMPLANT
PACK UNIVERSAL I (CUSTOM PROCEDURE TRAY) ×2 IMPLANT
PAD ARMBOARD 7.5X6 YLW CONV (MISCELLANEOUS) ×4 IMPLANT
PATTIES SURGICAL .5 X.5 (GAUZE/BANDAGES/DRESSINGS) IMPLANT
PATTIES SURGICAL .5 X1 (DISPOSABLE) IMPLANT
SPONGE SURGIFOAM ABS GEL 100 (HEMOSTASIS) IMPLANT
STRIP CLOSURE SKIN 1/2X4 (GAUZE/BANDAGES/DRESSINGS) ×2 IMPLANT
SURGIFLO W/THROMBIN 8M KIT (HEMOSTASIS) IMPLANT
SUT BONE WAX W31G (SUTURE) ×2 IMPLANT
SUT MON AB 3-0 SH 27 (SUTURE) ×1
SUT MON AB 3-0 SH27 (SUTURE) ×1 IMPLANT
SUT VIC AB 0 CT1 27 (SUTURE) ×1
SUT VIC AB 0 CT1 27XBRD ANBCTR (SUTURE) ×1 IMPLANT
SUT VIC AB 1 CTX 36 (SUTURE) ×2
SUT VIC AB 1 CTX36XBRD ANBCTR (SUTURE) ×2 IMPLANT
SUT VIC AB 2-0 CT1 18 (SUTURE) ×2 IMPLANT
SYR BULB IRRIGATION 50ML (SYRINGE) ×2 IMPLANT
SYR CONTROL 10ML LL (SYRINGE) ×2 IMPLANT
TOWEL OR 17X24 6PK STRL BLUE (TOWEL DISPOSABLE) ×2 IMPLANT
TOWEL OR 17X26 10 PK STRL BLUE (TOWEL DISPOSABLE) ×2 IMPLANT
WATER STERILE IRR 1000ML POUR (IV SOLUTION) ×2 IMPLANT
YANKAUER SUCT BULB TIP NO VENT (SUCTIONS) ×2 IMPLANT

## 2018-11-26 NOTE — Anesthesia Preprocedure Evaluation (Addendum)
Anesthesia Evaluation  Patient identified by MRN, date of birth, ID band Patient awake    Reviewed: Allergy & Precautions, NPO status , Patient's Chart, lab work & pertinent test results  Airway Mallampati: II  TM Distance: >3 FB Neck ROM: Full    Dental  (+) Teeth Intact, Dental Advisory Given   Pulmonary former smoker,    breath sounds clear to auscultation       Cardiovascular hypertension,  Rhythm:Regular Rate:Normal     Neuro/Psych    GI/Hepatic   Endo/Other    Renal/GU      Musculoskeletal   Abdominal   Peds  Hematology   Anesthesia Other Findings   Reproductive/Obstetrics                            Anesthesia Physical Anesthesia Plan  ASA: III and emergent  Anesthesia Plan: General   Post-op Pain Management:    Induction: Intravenous  PONV Risk Score and Plan: Ondansetron  Airway Management Planned: Oral ETT  Additional Equipment:   Intra-op Plan:   Post-operative Plan: Extubation in OR  Informed Consent: I have reviewed the patients History and Physical, chart, labs and discussed the procedure including the risks, benefits and alternatives for the proposed anesthesia with the patient or authorized representative who has indicated his/her understanding and acceptance.     Dental advisory given  Plan Discussed with: Anesthesiologist and CRNA  Anesthesia Plan Comments:         Anesthesia Quick Evaluation

## 2018-11-26 NOTE — Anesthesia Procedure Notes (Signed)
Procedure Name: Intubation Date/Time: 11/26/2018 6:01 PM Performed by: Teressa Lower., CRNA Pre-anesthesia Checklist: Patient identified, Emergency Drugs available, Suction available and Patient being monitored Patient Re-evaluated:Patient Re-evaluated prior to induction Oxygen Delivery Method: Circle system utilized Preoxygenation: Pre-oxygenation with 100% oxygen Induction Type: IV induction Ventilation: Mask ventilation without difficulty Laryngoscope Size: Mac and 3 Grade View: Grade I Tube type: Oral Tube size: 7.0 mm Number of attempts: 1 Airway Equipment and Method: Stylet Placement Confirmation: ETT inserted through vocal cords under direct vision,  positive ETCO2 and breath sounds checked- equal and bilateral Secured at: 21 cm Tube secured with: Tape Dental Injury: Teeth and Oropharynx as per pre-operative assessment

## 2018-11-26 NOTE — Op Note (Signed)
Operative report  Preoperative diagnosis postoperative hematoma  Postoperative diagnosis: Same  Operative procedure: Evacuation of postoperative hematoma and evaluation of decompression.  Intraoperative findings: Significant hematoma noted consistent with preoperative MRI.  Hematoma evacuated as was the thrombin Gelfoam that was placed at the time of the index procedure.  Repeat Valsalva to 40 demonstrated no CSF leak.  Area of CSF repair was inspected and noted to be intact.  Indications: This is a very pleasant 68 year old woman who 5 days ago underwent a posterior lumbar decompression.  She was discharged yesterday and unfortunately fell and had significant increase in her back and bilateral leg pain.  Imaging studies demonstrated a large hematoma and so this was made take her back to the operating room for evacuation and reexploration of the wound.  Complications: None  Anesthesia: General  Operative report: Patient was brought the operating room placed by the operating room table.  After successful induction of general anesthesia and endotracheal ovation teds SCDs were applied she was turned prone onto the Wilson frame.  All bony prominences well-padded and the back was prepped and draped in a standard fashion.  Timeout was taken to confirm patient procedure and all other important data.  The sutures were removed and I opened up the skin and expose the sub-cutaneous closure.  There was some serous sanguinous drainage but no frank pus no purulent odor.  Retractors were placed and I remove the deep fascial sutures to expose the deep layer.  There are noted large quantities of clot and fresh blood.  This was all evacuated and copiously irrigated with approximately 2 L of saline.  Once I had irrigated out I then used [facility] to remove the Gelfoam thrombin at placed at the time of the index surgery.  Once I gently dissected this I was able to see the thecal sac.  I continue to inspect into  the lateral recess and identified the Nurolon that was placed for the CSF repair.  I then was able to remove all of the remaining hematoma and expose the entire thecal sac.  I irrigated copiously with normal saline again.  I was able to pass my elevator superiorly underneath the intact lamina and inferiorly freely moving it to confirm that there was no further residual stenosis/nerve compression.  I was also able to palpate into the lateral recesses at the L3-4 and L4-5 level as well as down into the L5 space confirming that adequate decompression.  At this point I then add Valsalva to 40 mmHg for 10 seconds and I inspected the area where the CSF was repaired.  The repair was intact and I saw no clear fluid expressed during the Valsalva maneuver.  At this point I then checked the muscular sidewalls and I used bipolar cautery to obtain hemostasis.  I also used bipolar cautery to coagulate some small epidural veins.  This point I placed a deep drain and brought out of a separate stab incision.  I then monitor the wound for about 5 minutes and there was no further bleeding.  I then closed the deep fascia with interrupted #1 Vicryl sutures, superficial 2-0 Vicryl suture, and the skin with a 2-0 Prolene.  The drain was also stitched in with a 2-0 silk stitch.  Dry dressings were applied and the patient was extubated transfer the PACU without incident.  The end of the case all needle sponge counts were correct.  There were no adverse intraoperative events.

## 2018-11-26 NOTE — Anesthesia Postprocedure Evaluation (Signed)
Anesthesia Post Note  Patient: Pamela Sims  Procedure(s) Performed: EVACUATION OF HEMATOMA LUMBAR (N/A Spine Lumbar)     Patient location during evaluation: PACU Anesthesia Type: General Level of consciousness: patient cooperative and confused Pain management: pain level controlled Vital Signs Assessment: post-procedure vital signs reviewed and stable Respiratory status: spontaneous breathing, nonlabored ventilation, respiratory function stable and patient connected to nasal cannula oxygen Cardiovascular status: blood pressure returned to baseline and stable Postop Assessment: no apparent nausea or vomiting Anesthetic complications: no    Last Vitals:  Vitals:   11/26/18 1613 11/26/18 1911  BP: (!) 147/76 (!) 151/77  Pulse: 86 87  Resp:  11  Temp: 37 C 36.7 C  SpO2: 95% 100%    Last Pain:  Vitals:   11/26/18 1911  TempSrc:   PainSc: 0-No pain                 Kazaria Gaertner COKER

## 2018-11-26 NOTE — Progress Notes (Signed)
Dr. Linna Caprice made aware of PO intake at 1130 (bagel with mayo, fruit, tomato). Patient reports severe type spasm pain. Ok per Dr. Linna Caprice to give ordered robaxin. Patient/ family made aware that I have contacted pharmacy and waiting on same.

## 2018-11-26 NOTE — Brief Op Note (Signed)
11/26/2018  7:09 PM  PATIENT:  [name]  68 y.o. female  PRE-OPERATIVE DIAGNOSIS:  status post op lumbar decompression  POST-OPERATIVE DIAGNOSIS:  status post op lumbar decompression  PROCEDURE:  Procedure(s): EVACUATION OF HEMATOMA LUMBAR (N/A)  SURGEON:  Surgeon(s) and Role:    Melina Schools, MD - Primary  PHYSICIAN ASSISTANT:   ASSISTANTS: none   ANESTHESIA:   general  EBL:  minimal   BLOOD ADMINISTERED:none  DRAINS: Hemovac   LOCAL MEDICATIONS USED:  NONE  SPECIMEN:  No Specimen  DISPOSITION OF SPECIMEN:  N/A  COUNTS:  YES  TOURNIQUET:  * No tourniquets in log *  DICTATION: .Dragon Dictation  PLAN OF CARE: Admit to inpatient   PATIENT DISPOSITION:  PACU - hemodynamically stable.

## 2018-11-26 NOTE — Transfer of Care (Signed)
Immediate Anesthesia Transfer of Care Note  Patient: Pamela Sims  Procedure(s) Performed: EVACUATION OF HEMATOMA LUMBAR (N/A Spine Lumbar)  Patient Location: PACU  Anesthesia Type:General  Level of Consciousness: awake  Airway & Oxygen Therapy: Patient Spontanous Breathing and Patient connected to face mask oxygen  Post-op Assessment: Report given to RN and Post -op Vital signs reviewed and stable  Post vital signs: Reviewed and stable  Last Vitals:  Vitals Value Taken Time  BP 151/77 11/26/2018  7:11 PM  Temp    Pulse 87 11/26/2018  7:14 PM  Resp 13 11/26/2018  7:14 PM  SpO2 96 % 11/26/2018  7:14 PM  Vitals shown include unvalidated device data.  Last Pain:  Vitals:   11/26/18 1613  TempSrc: Oral  PainSc:          Complications: No apparent anesthesia complications
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
## 2018-11-27 ENCOUNTER — Encounter (HOSPITAL_COMMUNITY): Payer: Self-pay | Admitting: Orthopedic Surgery

## 2018-11-27 ENCOUNTER — Inpatient Hospital Stay (HOSPITAL_COMMUNITY): Payer: Medicare Other

## 2018-11-27 MED ORDER — DOCUSATE SODIUM 100 MG PO CAPS
100.0000 mg | ORAL_CAPSULE | Freq: Every day | ORAL | Status: DC | PRN
  Administered 2018-11-27: 100 mg via ORAL
  Filled 2018-11-27: qty 1

## 2018-11-27 MED FILL — Thrombin (Recombinant) For Soln 20000 Unit: CUTANEOUS | Qty: 1 | Status: AC

## 2018-11-27 NOTE — NC FL2 (Addendum)
[place] LEVEL OF CARE SCREENING TOOL     IDENTIFICATION  Patient Name: Pamela Sims Birthdate: 1950-12-13 Sex: female Admission Date (Current Location): 11/26/2018  [facility] and [place] Number:  Herbalist and Address:  [facility] [place], [place] [address], [place], [place] [address]      Provider Number: [id number]  Attending Physician Name and Address:  Melina Schools, MD  Relative Name and Phone Number:   [name], daughter, [phone]    Current Level of Care: Hospital Recommended Level of Care: [place] Prior Approval Number:    Date Approved/Denied:   PASRR Number: pending  Discharge Plan: SNF    Current Diagnoses: Patient Active Problem List   Diagnosis Date Noted  . Hematoma 11/26/2018  . Spinal stenosis 11/22/2018  . Microscopic hematuria 11/07/2016  . Urinary tract infection without hematuria 11/07/2016  . GERD (gastroesophageal reflux disease) 04/01/2016  . Neck pain 02/06/2014  . DYSPNEA ON EXERTION 06/26/2009  . ALLERGIC RHINITIS 06/12/2009  . HEADACHE, CHRONIC 06/12/2009  . COUGH, CHRONIC 06/12/2009  . HYPERLIPIDEMIA 06/11/2009  . Essential hypertension 06/11/2009    Orientation RESPIRATION BLADDER Height & Weight     Self, Time, Situation, Place  Normal Continent Weight:   Height:     BEHAVIORAL SYMPTOMS/MOOD NEUROLOGICAL BOWEL NUTRITION STATUS      Continent Diet(see discharge summary )  AMBULATORY STATUS COMMUNICATION OF NEEDS Skin   Limited Assist Verbally Surgical wounds(on back with honey comb dressing)                       Personal Care Assistance Level of Assistance  Bathing, Feeding, Dressing Bathing Assistance: Limited assistance Feeding assistance: Independent Dressing Assistance: Limited assistance     Functional Limitations Info  Sight, Hearing, Speech Sight Info: Adequate(wears glasses) Hearing Info: Adequate Speech Info: Adequate    SPECIAL CARE  FACTORS FREQUENCY  PT (By licensed PT), OT (By licensed OT)     PT Frequency: five times a week OT Frequency: five times a week            Contractures Contractures Info: Not present    Additional Factors Info  Code Status, Allergies, Psychotropic Code Status Info: full Allergies Info: adhesive tape  Psychotropic Info: CYMBALTA) DR capsule 30 mg   daily PO          Current Medications (11/27/2018):  This is the current hospital active medication list Current Facility-Administered Medications  Medication Dose Route Frequency Provider Last Rate Last Dose  . 0.9 %  sodium chloride infusion  250 mL Intravenous Continuous Melina Schools, MD      . acetaminophen (TYLENOL) tablet 650 mg  650 mg Oral Q4H PRN Melina Schools, MD       Or  . acetaminophen (TYLENOL) suppository 650 mg  650 mg Rectal Q4H PRN Melina Schools, MD      . amLODipine (NORVASC) tablet 10 mg  10 mg Oral Daily Melina Schools, MD   10 mg at 11/27/18 0840  . ceFAZolin (ANCEF) IVPB 1 g/50 mL premix  1 g Intravenous Q8H Melina Schools, MD 100 mL/hr at 11/27/18 1131 1 g at 11/27/18 1131  . DULoxetine (CYMBALTA) DR capsule 30 mg  30 mg Oral Daily Melina Schools, MD   30 mg at 11/27/18 0840  . HYDROmorphone (DILAUDID) injection 1 mg  1 mg Intravenous Q2H PRN Melina Schools, MD      . lactated ringers infusion   Intravenous Continuous  Roberts Gaudy, MD 10 mL/hr at 11/26/18 1640    . lactated ringers infusion   Intravenous Continuous Melina Schools, MD 85 mL/hr at 11/27/18 0400    . menthol-cetylpyridinium (CEPACOL) lozenge 3 mg  1 lozenge Oral PRN Melina Schools, MD       Or  . phenol (CHLORASEPTIC) mouth spray 1 spray  1 spray Mouth/Throat PRN Melina Schools, MD      . methocarbamol (ROBAXIN) 500 mg in dextrose 5 % 50 mL IVPB  500 mg Intravenous Q6H PRN Melina Schools, MD 100 mL/hr at 11/26/18 1716 500 mg at 11/26/18 1744  . morphine 2 MG/ML injection 2 mg  2 mg Intravenous Q2H PRN Melina Schools, MD   2 mg at 11/26/18 1559   . ondansetron (ZOFRAN) injection 4 mg  4 mg Intravenous Q6H Melina Schools, MD   4 mg at 11/27/18 1143  . ondansetron (ZOFRAN) tablet 4 mg  4 mg Oral Q6H PRN Melina Schools, MD       Or  . ondansetron [facility]) injection 4 mg  4 mg Intravenous Q6H PRN Melina Schools, MD      . oxyCODONE (Oxy IR/ROXICODONE) immediate release tablet 10 mg  10 mg Oral Q3H PRN Melina Schools, MD      . oxyCODONE (Oxy IR/ROXICODONE) immediate release tablet 5 mg  5 mg Oral Q3H PRN Melina Schools, MD      . polyethylene glycol (MIRALAX / GLYCOLAX) packet 17 g  17 g Oral Daily PRN Melina Schools, MD   17 g at 11/27/18 0841  . pregabalin (LYRICA) capsule 75 mg  75 mg Oral BID Melina Schools, MD   75 mg at 11/27/18 0840  . sodium chloride flush (NS) 0.9 % injection 3 mL  3 mL Intravenous Q12H Melina Schools, MD   3 mL at 11/27/18 0841  . sodium chloride flush (NS) 0.9 % injection 3 mL  3 mL Intravenous PRN Melina Schools, MD         Discharge Medications: Please see discharge summary for a list of discharge medications.  Relevant Imaging Results:  Relevant Lab Results:   Additional Information ss# [place], [place]

## 2018-11-27 NOTE — Clinical Social Work Note (Signed)
Clinical Social Work Assessment  Patient Details  Name: Pamela Sims MRN: [id number] Date of Birth: 08/22/1951  Date of referral:  11/27/18               Reason for consult:  Facility Placement, Discharge Planning                Permission sought to share information with:  Family Supports, Facility Contact Representative Permission granted to share information::     Name::     Pamela Sims  Agency::   [facility]  Relationship::  daughter  Contact Information:  [phone]  Housing/Transportation Living arrangements for the past 2 months:  Single Family Home Source of Information:  Patient Patient Interpreter Needed:  None, [place] Criminal Activity/Legal Involvement Pertinent to Current Situation/Hospitalization:  No - Comment as needed Significant Relationships:  Adult Children, Other Family Members Lives with:  Relatives Do you feel safe going back to the place where you live?  Yes Need for family participation in patient care:  Yes (Comment)  Care giving concerns:  Pt from home with her grandson and his girlfriend. She has other family members that assist but all work and cannot help 24/7. Pt with recent discharge and had readmitted due to fall at home.  Interested in SNF at discharge.   Social Worker assessment / plan:  CSW met with pt at bedside, introduced self, role, and reason for visit. Pt alert and did not express having any active hallucinations during assessment. Pt from home with her 21 year old grandson and his girlfriend. She recently underwent surgery discharged home and had a fall. At this discharge pt prefers to go to SNF. Pt states that she would like to be in [place] and understands the referral process.  Pt referral will be sent out to [place] SNFs. Will f/u with pt with offers when available.   Employment status:  Retired Insurance information:  Managed Medicare PT Recommendations:  24 Hour Supervision, Skilled Nursing Facility Information / Referral to  community resources:  Skilled Nursing Facility  Patient/Family's Response to care:  Pt amenable to speaking with CSW and interested in SNF.  Patient/Family's Understanding of and Emotional Response to Diagnosis, Current Treatment, and Prognosis:  Pt states understanding of her diagnosis, current treatment and prognosis. Pt pleasant and appropriate throughout the assessment.  Emotional Assessment Appearance:  Appears stated age Attitude/Demeanor/Rapport:  Engaged, Gracious Affect (typically observed):  Accepting, Adaptable, Appropriate Orientation:  Oriented to Self, Oriented to Place, Oriented to Situation, Oriented to  Time Alcohol / Substance use:  Not Applicable Psych involvement (Current and /or in the community):  No (Comment)  Discharge Needs  Concerns to be addressed:   Care Coordination Readmission within the last 30 days:   Yes Current discharge risk:   Lack of support; Physical Impairment Barriers to Discharge:   Insurance Approval, Continued Medical Work Up    H , LCSWA 11/27/2018, 11:53 AM  

## 2018-11-27 NOTE — Evaluation (Signed)
Occupational Therapy Evaluation Patient Details Name: Pamela Sims MRN: [id number] DOB: [birth year] Today's Date: 11/27/2018    History of Present Illness Pt is a 68 y/o female underwent evacuation of hematoma on 03-02 after a fall at home. Pt recently admitted for s/p L3-S1 PLIF on 11/22/2018 (with intraoperative durotomy). PMH significant for HTN, hiatal hernia, HA, diverticulosi, asthma, anxiety, ADHD, B TKR, ACDF C4-C6 2015.   Clinical Impression   Patient is s/p evacuation of hematoma surgery s/p fall  resulting in functional limitations due to the deficits listed below (see OT problem list). Pt currently requires redirection to task and cues to decr speed with task. Pt experiencing hallucinations but aware that the images are not real. Pt with RW min (A) with L LE weakness.  Patient will benefit from skilled OT acutely to increase independence and safety with ADLS to allow discharge SNF.     Follow Up Recommendations  SNF    Equipment Recommendations  None recommended by OT    Recommendations for Other Services       Precautions / Restrictions Precautions Precautions: Fall;Back Precaution Comments: recalling all back reprecautions Required Braces or Orthoses: Spinal Brace Spinal Brace: Lumbar corset;Applied in sitting position      Mobility Bed Mobility Overal bed mobility: Needs Assistance Bed Mobility: Rolling;Supine to Sit Rolling: Supervision   Supine to sit: Supervision     General bed mobility comments: pt exiting on the L side with incr time and use of bed rail  Transfers Overall transfer level: Needs assistance Equipment used: Rolling walker (2 wheeled) Transfers: Sit to/from Stand Sit to Stand: Min guard         General transfer comment: cues for hand placement with RW    Balance Overall balance assessment: Needs assistance         Standing balance support: Single extremity supported;During functional activity Standing balance-Leahy Scale:  Fair                             ADL either performed or assessed with clinical judgement   ADL Overall ADL's : Needs assistance/impaired     Grooming: Min guard;Standing(sink level) Grooming Details (indicate cue type and reason): pt very fast and needs cues to pace the task and decr speed Upper Body Bathing: Minimal assistance;Sitting Upper Body Bathing Details (indicate cue type and reason): pt requires (A) To don brace and max (A) to tighten brace at thi stime Lower Body Bathing: Moderate assistance;Sit to/from stand           Toilet Transfer: Minimal assistance;RW   Toileting- Clothing Manipulation and Hygiene: Min guard;Sitting/lateral lean Toileting - Clothing Manipulation Details (indicate cue type and reason): cues to not reach for toilet paper due to bending     Functional mobility during ADLs: Minimal assistance;Rolling walker General ADL Comments: pt reports L LE feels weaker.      Vision Baseline Vision/History: Wears glasses Wears Glasses: At all times       Perception     Praxis      Pertinent Vitals/Pain Pain Assessment: Faces Pain Score: 4  Pain Location: sore Pain Intervention(s): Repositioned;Monitored during session;Premedicated before session     Hand Dominance Right   Extremity/Trunk Assessment Upper Extremity Assessment Upper Extremity Assessment: Overall WFL for tasks assessed   Lower Extremity Assessment Lower Extremity Assessment: Defer to PT evaluation   Cervical / Trunk Assessment Cervical / Trunk Assessment: Other exceptions Cervical / Trunk Exceptions: s/p surg  Communication Communication Communication: No difficulties   Cognition Arousal/Alertness: Awake/alert Behavior During Therapy: WFL for tasks assessed/performed Overall Cognitive Status: Impaired/Different from baseline                                 General Comments: pt experiencing hallunications but aware that the images are not real.  pt reports that talking to the images helps them go away. pt states "i know this is psychological i guess from all the medication"   General Comments  pt with JP drain intact at this time    Exercises Other Exercises Other Exercises: pt verbalized 3 back precautions but can continue from OT for demonstration during ADL task   Shoulder Instructions      Home Living Family/patient expects to be discharged to:: Skilled nursing facility Living Arrangements: Other relatives Available Help at Discharge: Family;Available PRN/intermittently Type of Home: House Home Access: Stairs to enter [employer] of Steps: 1 Entrance Stairs-Rails: None Home Layout: Two level;Able to live on main level with bedroom/bathroom     Bathroom Shower/Tub: Occupational psychologist: Standard     Home Equipment: Environmental consultant - 2 wheels;Shower seat - built in;Hand held shower head;Bedside commode;Adaptive equipment;Grab bars - tub/shower Adaptive Equipment: Sock aid Additional Comments: pt speaks of "[place]" the sons girlfriend being availabe to be a "runner" but unable to physically (A). Pt's daughter Janett Billow present on eval at the beginning but leaving during session for work. pt does not have 24/7 (A) at home      Prior Functioning/Environment Level of Independence: Independent        Comments: former special Counselling psychologist        OT Problem List: Decreased activity tolerance;Decreased cognition;Decreased knowledge of precautions;Decreased knowledge of use of DME or AE;Decreased safety awareness;Decreased strength;Pain;Impaired balance (sitting and/or standing);Obesity      OT Treatment/Interventions: Self-care/ADL training;Therapeutic exercise;Neuromuscular education;Energy conservation;DME and/or AE instruction;Manual therapy;Therapeutic activities;Cognitive remediation/compensation;Patient/family education;Balance training    OT Goals(Current goals can be found in the care plan  section) Acute Rehab OT Goals Patient Stated Goal: to get some rest "I have been up since 2 AM" OT Goal Formulation: With patient Time For Goal Achievement: 12/11/18 Potential to Achieve Goals: Good  OT Frequency: Min 2X/week   Barriers to D/C: Decreased caregiver support          Co-evaluation              AM-PAC OT "6 Clicks" Daily Activity     Outcome Measure Help from another person eating meals?: None Help from another person taking care of personal grooming?: A Little Help from another person toileting, which includes using toliet, bedpan, or urinal?: A Little Help from another person bathing (including washing, rinsing, drying)?: A Lot Help from another person to put on and taking off regular upper body clothing?: None Help from another person to put on and taking off regular lower body clothing?: A Lot 6 Click Score: 18   End of Session Equipment Utilized During Treatment: Rolling walker;Back brace Nurse Communication: Mobility status;Precautions  Activity Tolerance: Patient tolerated treatment well Patient left: in chair;with call bell/phone within reach;with chair alarm set  OT Visit Diagnosis: Unsteadiness on feet (R26.81);Muscle weakness (generalized) (M62.81)                Time: 9509-3267 OT Time Calculation (min): 28 min Charges:  OT General Charges $OT Visit: 1 Visit OT Evaluation $OT Eval Moderate Complexity:  [place] Jenah Vanasten, OTR/L  Acute Rehabilitation Services Pager: [phone] Office: [phone] .   Jeri Modena 11/27/2018, 10:42 AM

## 2018-11-27 NOTE — Progress Notes (Signed)
    Subjective: Procedure(s) (LRB): EVACUATION OF HEMATOMA LUMBAR (N/A) 1 Day Post-Op  Patient reports pain as 2 on 0-10 scale.  Reports decreased leg pain reports incisional back pain   Positive void Negative bowel movement Positive flatus Negative chest pain or shortness of breath  Objective: Vital signs in last 24 hours: Temp:  [98.1 F (36.7 C)-98.9 F (37.2 C)] 98.8 F (37.1 C) (03/03 1518) Pulse Rate:  [83-95] 95 (03/03 1518) Resp:  [8-22] 18 (03/03 0816) BP: (120-158)/(60-84) 123/60 (03/03 1518) SpO2:  [93 %-100 %] 94 % (03/03 1518)  Intake/Output from previous day: 03/02 0701 - 03/03 0700 In: 1329.3 [I.V.:1279.3; IV Piggyback:50] Out: 960 [Urine:850; Drains:100; Blood:10]  Labs: Recent Labs    11/25/18 2135 11/26/18 1620  WBC 8.1 9.1  RBC 3.55* 3.80*  HCT 30.2* 31.6*  PLT 159 192   Recent Labs    11/25/18 2135  NA 130*  K 3.7  CL 97*  CO2 27  BUN 14  CREATININE 0.69  GLUCOSE 115*  CALCIUM 8.3*   Recent Labs    11/26/18 1620  INR 1.1    Physical Exam: Neurologically intact Sensation intact distally Dorsiflexion/Plantar flexion intact Incision: dressing C/D/I Compartment soft There is no height or weight on file to calculate BMI.   Assessment/Plan: Patient stable  xrays n/a Continue mobilization with physical therapy Continue care  Advance diet Up with therapy  1. Patient having vivid auditory and visual hallucinations.  Patient aware that they are hallucinations.  Lyrica d/c'ed, patient has not had narcotics.  Will have psych consult in AM if it persists 2. CXR -results pending.   3. No fevers/chills - will monitor exam 4. Continue to encourage mobilization 5. No spinal headaches  6. Drain output decreasing - will monitor   Melina Schools, MD [facility] [phone]

## 2018-11-27 NOTE — Progress Notes (Signed)
Subjective: 1 Day Post-Op Procedure(s) (LRB): EVACUATION OF HEMATOMA LUMBAR (N/A) Secondary to a fall status post L3-S1 posterior lumbar interbody fusion in 11/22/2018 with intraoperative durotomy. Patient reports pain as mild.  Radicular leg pain has improved. Tolerating PO. No N/V. + Flatus, -BM, no abdominal pain or distention. Voiding. Up and walking with PT. Denies HA. Denies Calf pain. +Productive cough and mild SOB as of this morning. No sweats/chills. Patient also reporting visual and auditory hallucinations.  Objective: Vital signs in last 24 hours: Temp:  [98.1 F (36.7 C)-98.9 F (37.2 C)] 98.8 F (37.1 C) (03/03 1136) Pulse Rate:  [83-91] 91 (03/03 1136) Resp:  [8-22] 18 (03/03 0816) BP: (120-158)/(65-84) 120/65 (03/03 1136) SpO2:  [93 %-100 %] 98 % (03/03 1136)  Intake/Output from previous day: 03/02 0701 - 03/03 0700 In: 1329.3 [I.V.:1279.3; IV Piggyback:50] Out: 960 [Urine:850; Drains:100; Blood:10] Intake/Output this shift: Total I/O In: 766.7 [P.O.:240; I.V.:526.7] Out: -   Recent Labs    11/25/18 2135 11/26/18 1620  HGB 10.2* 10.7*   Recent Labs    11/25/18 2135 11/26/18 1620  WBC 8.1 9.1  RBC 3.55* 3.80*  HCT 30.2* 31.6*  PLT 159 192   Recent Labs    11/25/18 2135  NA 130*  K 3.7  CL 97*  CO2 27  BUN 14  CREATININE 0.69  GLUCOSE 115*  CALCIUM 8.3*   Recent Labs    11/26/18 1620  INR 1.1   General: AAOx3 Incision: Clean dry and intact. No discharge or blood soaki  Surrounding skin positive for ecchymosis due to yesterday's fall. Drain: About 200 cc of seroushematogenous Serosanguineous drainage CV: Regular rate and rhythm, no rubs murmurs or gallops Lungs: Positive for bilateral coarseness Abdomen: Bowel sounds 4, nondistended nontender, no hepatosplenomegaly.  Musculoskeletal: Neurologically intact Neurovascular intact Sensation intact distally Intact pulses distally Dorsiflexion/Plantar flexion intact Compartment soft Calf  nontender to palpation, no swelling noted, no palpable cords, Homans negative bilaterally.  Assessment/Plan: 1 Day Post-Op Procedure(s) (LRB): EVACUATION OF HEMATOMA LUMBAR (N/A) Secondary to a fall status post L3-S1 posterior lumbar interbody fusion in 11/22/2018 with intraoperative durotomy. - OOB with PT/OT - DVT PPX: TEDs, SCDs, ambulation - Plan will be to D/C to SNF  Productive cough/SOB - Low concern for Pneumonia given no fever/sweats/chills, no elevated white count. Low concern for PE in the absence of CP, calf pain, tachycardia. No prior history of clots, no current hormone therapy. - Will continue to encourage DVT ppx as well as IS. - Will continue to monitor if tachycardia, fever, CP. - We will order a chest x-ray to rule out pneumonia - May consider CTA if pneumonia is ruled out and patient has additional risk factors or signs and symptoms of a PE.  Hallucinations: - Unsure what the cause of hallucinations may be as she has no significant psych history (Anxiety, depression and ADHD only on Cymbalta), additionally she has not had pain medications in several hours and continues to have hallucinations. Patient does not seem to be demented as she is AAOx3. -  Discontinue Lyrica as her radicular leg pain has improved and that this medication may be contributing to her hallucinations. - Continue to Use narcotic medication sparingly.  Plan of care discussed with my attending Dr. Rolena Infante.   Anticipated LOS equal to or greater than 2 midnights due to - Age 68 and older with one or more of the following:  - Obesity  - Expected need for hospital services (PT, OT, Nursing) required for safe  discharge  -  Anticipated need for postoperative skilled nursing care or inpatient rehab  - Active co-morbidities: None OR   - Unanticipated findings during/Post Surgery: intraoperative durotomy, post-operative fall and hematoma requiring hematoma evacuation  - Patient is a high risk of  re-admission due to: None    Yvonne Kendall Ward 11/27/2018, 1:09 PM

## 2018-11-27 NOTE — Evaluation (Signed)
Physical Therapy Evaluation Patient Details Name: Pamela Sims MRN: [id number] DOB: 1951-04-23 Today's Date: 11/27/2018   History of Present Illness  Pt is a 68 y/o female underwent evacuation of hematoma on 03-02 after a fall at home. Pt recently admitted for s/p L3-S1 PLIF on 11/22/2018 (with intraoperative durotomy). PMH significant for HTN, hiatal hernia, HA, diverticulosi, asthma, anxiety, ADHD, B TKR, ACDF C4-C6 2015.  Clinical Impression  Patient is s/p above surgery resulting in the deficits listed below (see PT Problem List). Pt with hallucinations yet able to recall back precautions from initial surgery last week. Pt moving at min guard level however with increased ambulation pt with onset of fatigue and L LE weakness with buckling requiring assist. Pt unsafe to return home without 24/7 assist available as patient had a fall less than 24 hours of being home upon d/c. Patient will benefit from skilled PT to increase their independence and safety with mobility (while adhering to their precautions) to allow discharge to the venue listed below.     Follow Up Recommendations SNF;Supervision/Assistance - 24 hour    Equipment Recommendations  None recommended by PT    Recommendations for Other Services       Precautions / Restrictions Precautions Precautions: Fall;Back Precaution Booklet Issued: Yes (comment) Precaution Comments: recalling all back reprecautions Required Braces or Orthoses: Spinal Brace Spinal Brace: Lumbar corset;Applied in sitting position Restrictions Weight Bearing Restrictions: No      Mobility  Bed Mobility Overal bed mobility: Needs Assistance Bed Mobility: Rolling;Supine to Sit Rolling: Supervision Sidelying to sit: Supervision Supine to sit: Supervision   Sit to sidelying: Supervision General bed mobility comments: pt exiting on the L side with incr time and use of bed rail  Transfers Overall transfer level: Needs assistance Equipment used:  Rolling walker (2 wheeled) Transfers: Sit to/from Stand Sit to Stand: Min guard         General transfer comment: cues for hand placement with RW, increased time  Ambulation/Gait Ambulation/Gait assistance: Min guard Gait Distance (Feet): 120 Feet Assistive device: Rolling walker (2 wheeled) Gait Pattern/deviations: Step-through pattern;Decreased stride length;Narrow base of support Gait velocity: Decreased Gait velocity interpretation: <1.8 ft/sec, indicate of risk for recurrent falls General Gait Details: pt with circumduction of L LE and decreased control, pt with onset of L knee instability/buckling with increased distance of ambulation  Stairs            Wheelchair Mobility    Modified Rankin (Stroke Patients Only)       Balance Overall balance assessment: Needs assistance Sitting-balance support: No upper extremity supported;Feet supported Sitting balance-Leahy Scale: Good     Standing balance support: Single extremity supported;During functional activity Standing balance-Leahy Scale: Fair Standing balance comment: statically                             Pertinent Vitals/Pain Pain Assessment: Faces Pain Score: 4  Faces Pain Scale: Hurts a little bit Pain Location: sore at incision site Pain Descriptors / Indicators: Sore Pain Intervention(s): Monitored during session    Home Living Family/patient expects to be discharged to:: Skilled nursing facility Living Arrangements: Other relatives Available Help at Discharge: Family;Available PRN/intermittently Type of Home: House Home Access: Stairs to enter Entrance Stairs-Rails: None Entrance Stairs-Number of Steps: 1 Home Layout: Two level;Able to live on main level with bedroom/bathroom Home Equipment: [place] [employer]ile - 2 wheels;Shower seat - built in;Hand held shower head;Bedside commode;Adaptive equipment;Grab bars - tub/shower Additional Comments:  pt lives alone, reports she has "daisy" her grandson's  girlfriend but she isn't reliable    Prior Function Level of Independence: Needs assistance   Gait / Transfers Assistance Needed: needs RW since surgery, was Indep prior to initial surgery  ADL's / Homemaking Assistance Needed: assist with bathing/dressing since surgery, indep PTA  Comments: former special education teacher     Hand Dominance   Dominant Hand: Right    Extremity/Trunk Assessment   Upper Extremity Assessment Upper Extremity Assessment: Overall WFL for tasks assessed    Lower Extremity Assessment Lower Extremity Assessment: LLE deficits/detail LLE Deficits / Details: grossly 4/5, fatigues with walking and begins to buckle    Cervical / Trunk Assessment Cervical / Trunk Assessment: Other exceptions Cervical / Trunk Exceptions: s/p surg  Communication   Communication: No difficulties  Cognition Arousal/Alertness: Awake/alert Behavior During Therapy: WFL for tasks assessed/performed Overall Cognitive Status: Impaired/Different from baseline                                 General Comments: pt experiencing hallunications but aware that the images are not real. pt reports that talking to the images helps them go away. pt states "i know this is psychological i guess from all the medication"      General Comments General comments (skin integrity, edema, etc.): pt with JP drain on L side    Exercises Other Exercises Other Exercises: pt verbalized 3 back precautions but can continue from OT for demonstration during ADL task   Assessment/Plan    PT Assessment Patient needs continued PT services  PT Problem List Decreased strength;Decreased activity tolerance;Decreased balance;Decreased mobility;Decreased knowledge of use of DME;Decreased safety awareness;Decreased knowledge of precautions;Pain       PT Treatment Interventions DME instruction;Gait training;Stair training;Functional mobility training;Therapeutic activities;Therapeutic  exercise;Neuromuscular re-education;Patient/family education    PT Goals (Current goals can be found in the Care Plan section)  Acute Rehab PT Goals Patient Stated Goal: to get some rest "I have been up since 2 AM" PT Goal Formulation: With patient Time For Goal Achievement: 12/11/18 Potential to Achieve Goals: Good    Frequency Min 4X/week   Barriers to discharge Decreased caregiver support alone during the day    Co-evaluation               AM-PAC PT "6 Clicks" Mobility  Outcome Measure Help needed turning from your back to your side while in a flat bed without using bedrails?: A Little Help needed moving from lying on your back to sitting on the side of a flat bed without using bedrails?: A Little Help needed moving to and from a bed to a chair (including a wheelchair)?: A Little Help needed standing up from a chair using your arms (e.g., wheelchair or bedside chair)?: A Little Help needed to walk in hospital room?: A Little Help needed climbing 3-5 steps with a railing? : A Lot 6 Click Score: 17    End of Session Equipment Utilized During Treatment: Back brace Activity Tolerance: Patient tolerated treatment well Patient left: with call bell/phone within reach;in chair;with chair alarm set Nurse Communication: Mobility status PT Visit Diagnosis: Unsteadiness on feet (R26.81);Pain;Other symptoms and signs involving the nervous system (R29.898) Pain - Right/Left: Left Pain - part of body: Leg(and back)    Time: 0263-7858 PT Time Calculation (min) (ACUTE ONLY): 22 min   Charges:   PT Evaluation $PT Eval Moderate Complexity: 1 Mod  Kittie Plater, PT, DPT Acute Rehabilitation Services Pager #: [phone] Office #: [phone]   Berline Lopes 11/27/2018, 12:52 PM

## 2018-11-27 NOTE — Social Work (Signed)
Pt provided with CMS packet of offers. PASRR pending.  Westley Hummer, MSW, [place] Work [phone]

## 2018-11-27 NOTE — Social Work (Signed)
CSW acknowledging consult for SNF placement. Will follow for therapy recommendations.   Brayden Brodhead, MSW, LCSWA [place] Clinical Social Work [phone]   

## 2018-11-27 NOTE — Progress Notes (Addendum)
Notified PA that pt has been severely having visual and auditory hallucinations. PA to speak with MD. No new orders at this time. Will continue to monitor.

## 2018-11-28 MED ORDER — BENZONATATE 100 MG PO CAPS
100.0000 mg | ORAL_CAPSULE | Freq: Three times a day (TID) | ORAL | Status: DC | PRN
  Administered 2018-11-28: 100 mg via ORAL
  Filled 2018-11-28 (×2): qty 1

## 2018-11-28 MED ORDER — ONDANSETRON HCL 4 MG PO TABS
4.0000 mg | ORAL_TABLET | Freq: Four times a day (QID) | ORAL | Status: DC
  Administered 2018-11-28 – 2018-11-30 (×7): 4 mg via ORAL
  Filled 2018-11-28 (×7): qty 1

## 2018-11-28 MED ORDER — GUAIFENESIN 100 MG/5ML PO SOLN
5.0000 mL | ORAL | Status: DC | PRN
  Administered 2018-11-28 – 2018-11-29 (×6): 100 mg via ORAL
  Filled 2018-11-28 (×6): qty 10

## 2018-11-28 NOTE — Progress Notes (Addendum)
Paged MD on call for patient having coughing spells all during the night. Patient says that she has had this cough since Sept and it just never went away. New order for tessalon pearls ordered. Patient currently in bed, appears ill looking has not slept during the night, afebrile.  Up with walker to bathroom with x 1 assist. Frequent sips of water encouraged for the coughing but there is no relief.  Will administer tessalon pearls and continue to monitor.   0540- Coughing has lessened somewhat in frequency since tessalon pearls given. Tylenol also given d/t c/o generalized aches. Robaxin also given for muscle spasms and c/o back pain from doing so much coughing during the night. Patient noted to fall asleep at 10 minutes at a time, then wakes up and coughs.  Patient has had minimal hallucinations tonight, however she says that she kept hearing construction going on as well as saws on cutting wound. She also says that she saw a man's feet underneath the curtain and he started running when she called out to him. Patient is near ICU closing doors and the banging of the doors when ICU closes their doors was noted to be what the patient was hearing during the night and she thought construction was going on. Patient says that now she is afraid and does not know when she is hallucinating and what's not an hallucination. I reiterated and reassured her that she indeed did hear something during the night because of the location of her room and the double doors closing when ICU doors close.   Will continue to monitor.

## 2018-11-28 NOTE — Social Work (Signed)
Pt provided with updated list of bed offers, she was given CMS packet yesterday. PASRR still pending for pt.  Westley Hummer, MSW, [place] [facility] [phone]

## 2018-11-28 NOTE — Progress Notes (Signed)
This nurse was informed by tech that Hemovac would not stay charged. This nurse assessed and observed that it would not. Provider on call notified and gave orders to remove Hemovac. Will remove Hemovac and continue to monitor. Katherina Right RN

## 2018-11-28 NOTE — Progress Notes (Signed)
Physical Therapy Treatment Patient Details Name: Pamela Sims MRN: [id number] DOB: 1950/09/29 Today's Date: 11/28/2018    History of Present Illness Pt is a 68 y/o female underwent evacuation of hematoma on 03-02 after a fall at home. Pt recently admitted for s/p L3-S1 PLIF on 11/22/2018 (with intraoperative durotomy). PMH significant for HTN, hiatal hernia, HA, diverticulosi, asthma, anxiety, ADHD, B TKR, ACDF C4-C6 2015.    PT Comments    Pt progressing well with ambulation tolerance but remains to have some hallucinations (improved from yesterday) and some impulsivity however is oriented and able to recall back precautions. Pt unsafe to return home alone. Cont to recommend SNF upon d/c. Acute PT to cont to follow.    Follow Up Recommendations  SNF;Supervision/Assistance - 24 hour     Equipment Recommendations  None recommended by PT    Recommendations for Other Services       Precautions / Restrictions Precautions Precautions: Fall;Back Precaution Booklet Issued: Yes (comment) Precaution Comments: recalling all back reprecautions Required Braces or Orthoses: Spinal Brace Spinal Brace: Lumbar corset;Applied in sitting position Restrictions Weight Bearing Restrictions: No    Mobility  Bed Mobility Overal bed mobility: Needs Assistance Bed Mobility: Rolling;Supine to Sit Rolling: Supervision Sidelying to sit: Supervision       General bed mobility comments: pt in R sidelying, able to push self up into sitting  Transfers Overall transfer level: Needs assistance Equipment used: Rolling walker (2 wheeled) Transfers: Sit to/from Stand Sit to Stand: Min guard         General transfer comment: pt slightly impulsive today, pushed quickly up pushing up from bed  Ambulation/Gait Ambulation/Gait assistance: Min guard Gait Distance (Feet): 160 Feet Assistive device: Rolling walker (2 wheeled) Gait Pattern/deviations: Step-through pattern Gait velocity: quicker than  yesterday Gait velocity interpretation: 1.31 - 2.62 ft/sec, indicative of limited community ambulator General Gait Details: pt initially amb with RW up in air, s/p 77' pt put walker down stating "I am not steady, I need a walker"   Stairs             Wheelchair Mobility    Modified Rankin (Stroke Patients Only)       Balance Overall balance assessment: Needs assistance Sitting-balance support: No upper extremity supported;Feet supported Sitting balance-Leahy Scale: Good     Standing balance support: No upper extremity supported;During functional activity Standing balance-Leahy Scale: Fair Standing balance comment: pt washed hands at sink without LOB or leaning on counter                            Cognition Arousal/Alertness: Awake/alert Behavior During Therapy: WFL for tasks assessed/performed Overall Cognitive Status: Impaired/Different from baseline                                 General Comments: pt denies seeing any people today but constantly reports "you keep changing my room' however when pt reports what she sees in the room she is accurate      Exercises      General Comments General comments (skin integrity, edema, etc.): pt assisted to the bathroom. Pt unable to perform hygiene s/p BP without breaking back precautions, pt dependent on PT for cleaning. will have OT address, pt cont with JP drain      Pertinent Vitals/Pain Pain Assessment: 0-10 Pain Score: 2  Pain Location: sore at incision site Pain Descriptors /  Indicators: Sore Pain Intervention(s): Monitored during session    Home Living                      Prior Function            PT Goals (current goals can now be found in the care plan section) Progress towards PT goals: Progressing toward goals    Frequency    Min 4X/week      PT Plan Current plan remains appropriate    Co-evaluation              AM-PAC PT "6 Clicks" Mobility    Outcome Measure  Help needed turning from your back to your side while in a flat bed without using bedrails?: A Little Help needed moving from lying on your back to sitting on the side of a flat bed without using bedrails?: A Little Help needed moving to and from a bed to a chair (including a wheelchair)?: A Little Help needed standing up from a chair using your arms (e.g., wheelchair or bedside chair)?: A Little Help needed to walk in hospital room?: A Little Help needed climbing 3-5 steps with a railing? : A Lot 6 Click Score: 17    End of Session Equipment Utilized During Treatment: Back brace Activity Tolerance: Patient tolerated treatment well Patient left: with call bell/phone within reach;in chair;with chair alarm set Nurse Communication: Mobility status PT Visit Diagnosis: Unsteadiness on feet (R26.81);Pain;Other symptoms and signs involving the nervous system (R29.898)     Time: 2482-5003 PT Time Calculation (min) (ACUTE ONLY): 26 min  Charges:  $Gait Training: 8-22 mins $Therapeutic Activity: 8-22 mins                     Kittie Plater, PT, DPT Acute Rehabilitation Services Pager #: [phone] Office #: [phone]    Berline Lopes 11/28/2018, 2:04 PM

## 2018-11-28 NOTE — Progress Notes (Signed)
Subjective: 2 Days Post-Op Procedure(s) (LRB): EVACUATION OF HEMATOMA LUMBAR (N/A) secondary to fall status post L3-S1 posterior interbody fusion on 11/22/2018 with intraoperative durotomy. Radicular leg pain has improved. Patient reports pain as 3 on 0-10 scale even without narcotic pain medications. Tolerating PO w/o N/V. +BM, +flatus, -abdominal pain or distention. Voiding. Up and walking with PT/OT. Continues to have cough, but it seems to have improved with medications. Denies SOB today. Denies calf pain. No sweats/chills. No dizziness. Patient denies any visual hallucinations this morning (however, she is making inappropriate comments about changes in her environment).  Objective: Vital signs in last 24 hours: Temp:  [97.3 F (36.3 C)-99.1 F (37.3 C)] 97.3 F (36.3 C) (03/04 1138) Pulse Rate:  [73-95] 89 (03/04 1138) Resp:  [16] 16 (03/04 0731) BP: (99-142)/(53-75) 99/53 (03/04 1138) SpO2:  [94 %-100 %] 100 % (03/04 1138) Weight:  [026 kg] 107 kg (03/04 1026)  Intake/Output from previous day: 03/03 0701 - 03/04 0700 In: 1616.7 [P.O.:840; I.V.:526.7; IV Piggyback:250] Out: 75 [Drains:75] Intake/Output this shift: No intake/output data recorded.  Recent Labs    11/25/18 2135 11/26/18 1620  HGB 10.2* 10.7*   Recent Labs    11/25/18 2135 11/26/18 1620  WBC 8.1 9.1  RBC 3.55* 3.80*  HCT 30.2* 31.6*  PLT 159 192   Recent Labs    11/25/18 2135  NA 130*  K 3.7  CL 97*  CO2 27  BUN 14  CREATININE 0.69  GLUCOSE 115*  CALCIUM 8.3*   Recent Labs    11/26/18 1620  INR 1.1   General: AAOx3, making inappropriate comments about buildings that weren't there yesterday, stairs that weren't there yesterday, new decorations in her room.  Incision: Clean dry and intact. No discharge or blood soaki  Surrounding skin positive for ecchymosis due to yesterday's fall. Drain: About 200 cc of seroushematogenous Serosanguineous drainage Musculoskeletal: Neurologically  intact Neurovascular intact Sensation intact distally Intact pulses distally Dorsiflexion/Plantar flexion intact Compartment soft Calf nontender to palpation, no swelling noted, no palpable cords, Homans negative bilaterally.   Assessment/Plan:  2 Days Post-Op Procedure(s) (LRB): EVACUATION OF HEMATOMA LUMBAR (N/A) Secondary to a fall status post L3-S1 posterior lumbar interbody fusion in 11/22/2018 with intraoperative durotomy. - OOB with PT/OT - DVT PPX: TEDs, SCDs, ambulation - Plan will be to D/C to SNF  Productive cough/SOB - Low concern for Pneumonia given no fever/sweats/chills, no elevated white count. Low concern for PE in the absence of CP, calf pain, tachycardia. No prior history of clots, no current hormone therapy. - Will continue to encourage DVT ppx as well as IS. - Will continue to monitor if tachycardia, fever, CP. - CXR performed yesterday r/o pneumonia - Patient has orders for Tessalon Pearls and Robitussin  Hallucinations: - Unsure what the cause of hallucinations may be as she has no significant psych history (Anxiety, depression and ADHD only on Cymbalta), additionally she has not had pain medications for over 24 hours. Patient does not seem to be demented as she is AAOx3. -  Lyrica was discontinued yesterday - Per nursing staff, PT/OT patient appears mildly improved when compared to yesterday but is still making some inappropriate comments - Psych consult  Plan of care discussed with my attending Dr. Rolena Infante.   Anticipated LOS equal to or greater than 2 midnights due to - Age 66 and older with one or more of the following:             - Obesity             -  Expected need for hospital services (PT, OT, Nursing) required for safe   discharge             - Anticipated need for postoperative skilled nursing care or inpatient rehab             - Active co-morbidities: None OR   - Unanticipated findings during/Post Surgery: intraoperative durotomy,  post-operative fall and hematoma requiring hematoma evacuation  - Patient is a high risk of re-admission due to: None  Yvonne Kendall Ward 11/28/2018, 1:10 PM

## 2018-11-29 DIAGNOSIS — F3289 Other specified depressive episodes: Secondary | ICD-10-CM

## 2018-11-29 DIAGNOSIS — R443 Hallucinations, unspecified: Secondary | ICD-10-CM

## 2018-11-29 MED ORDER — ONDANSETRON HCL 4 MG PO TABS: 4.0000 mg | ORAL_TABLET | Freq: Three times a day (TID) | ORAL | 0 refills | Status: DC | PRN

## 2018-11-29 MED ORDER — METHOCARBAMOL 500 MG PO TABS: 500.0000 mg | ORAL_TABLET | Freq: Three times a day (TID) | ORAL | 0 refills | Status: AC | PRN

## 2018-11-29 MED ORDER — OXYCODONE-ACETAMINOPHEN 10-325 MG PO TABS: 1.0000 | ORAL_TABLET | Freq: Four times a day (QID) | ORAL | 0 refills | Status: DC | PRN
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
## 2018-11-29 NOTE — Social Work (Addendum)
4:40pm- Pt auth still pending for SNF, bedside RN aware. Will just need updated summary dated for tomorrow.  1:43pm- PASRR [phone] E, expires 12/29/2018 Auth pending.  Westley Hummer, MSW, [place] [facility] [phone]

## 2018-11-29 NOTE — Progress Notes (Signed)
    Subjective: Procedure(s) (LRB): EVACUATION OF HEMATOMA LUMBAR (N/A) 3 Days Post-Op  Patient reports pain as 2 on 0-10 scale.  Reports decreased leg pain reports incisional back pain   Positive void Positive bowel movement Positive flatus Negative chest pain or shortness of breath  Objective: Vital signs in last 24 hours: Temp:  [97.6 F (36.4 C)-98.5 F (36.9 C)] 98.1 F (36.7 C) (03/05 1114) Pulse Rate:  [73-89] 85 (03/05 1114) Resp:  [16] 16 (03/05 0738) BP: (121-150)/(67-90) 121/71 (03/05 1114) SpO2:  [97 %-100 %] 100 % (03/05 1114)  Intake/Output from previous day: 03/04 0701 - 03/05 0700 In: 206.9 [I.V.:108; IV Piggyback:98.9] Out: 30 [Drains:30]  Labs: Recent Labs    11/26/18 1620  WBC 9.1  RBC 3.80*  HCT 31.6*  PLT 192   No results for input(s): NA, K, CL, CO2, BUN, CREATININE, GLUCOSE, CALCIUM in the last 72 hours. Recent Labs    11/26/18 1620  INR 1.1    Physical Exam: Neurologically intact Sensation intact distally Intact pulses distally Dorsiflexion/Plantar flexion intact Incision: dressing C/D/I and no drainage Compartment soft Body mass index is 39.27 kg/m.   Assessment/Plan: Patient stable  xrays n/a Continue mobilization with physical therapy Continue care  Advance diet Up with therapy Discharge to SNF  Dressing change prior to discharge to SNF  Melina Schools, MD [facility] [phone]

## 2018-11-29 NOTE — Progress Notes (Signed)
Hemovac removed. Pt tolerated well. Site was clean dry and intact. No drainage noted at this time. New dressing applied. Will continue to monitor. Katherina Right RN

## 2018-11-29 NOTE — Progress Notes (Signed)
Physical Therapy Treatment Patient Details Name: Pamela Sims  MRN: [id number] DOB: 01-21-1951 Today's Date: 11/29/2018    History of Present Illness Pt is a 68 y/o female underwent evacuation of hematoma on 03-02 after a fall at home. Pt recently admitted for s/p L3-S1 PLIF on 11/22/2018 (with intraoperative durotomy). PMH significant for HTN, hiatal hernia, HA, diverticulosi, asthma, anxiety, ADHD, B TKR, ACDF C4-C6 2015.    PT Comments    Patient progressing well towards PT goals. Reports no more hallucinations since yesterday. Feeling more like herself but reporting a "hot spot" of pain in left hip with certain movements. Tolerated gait training with Min guard assist for balance/safety as pt with reports of right knee weakness, "feels like it wants to give." No buckling noted today. Able to recall 3/3 back precautions. Reports she is concerned about doing self care tasks- toileting and washing hair without breaking precautions. Will continue to follow and progress.     Follow Up Recommendations  SNF;Supervision/Assistance - 24 hour     Equipment Recommendations  None recommended by PT    Recommendations for Other Services       Precautions / Restrictions Precautions Precautions: Fall;Back Precaution Booklet Issued: Yes (comment) Precaution Comments: recalling all back reprecautions Required Braces or Orthoses: Spinal Brace Spinal Brace: Lumbar corset;Applied in sitting position Restrictions Weight Bearing Restrictions: No    Mobility  Bed Mobility Overal bed mobility: Needs Assistance Bed Mobility: Rolling;Sidelying to Sit Rolling: Supervision Sidelying to sit: Supervision;HOB elevated       General bed mobility comments: Cues for log roll technique; no assist needed.   Transfers Overall transfer level: Needs assistance Equipment used: Rolling walker (2 wheeled) Transfers: Sit to/from Stand Sit to Stand: Min guard         General transfer comment: Min guard  for safety. Stood from [employer], from toilet x1.   Ambulation/Gait Ambulation/Gait assistance: Min guard Gait Distance (Feet): 170 Feet Assistive device: Rolling walker (2 wheeled) Gait Pattern/deviations: Step-through pattern Gait velocity: decent speed towards end of gait Gait velocity interpretation: 1.31 - 2.62 ft/sec, indicative of limited community ambulator General Gait Details: Slow, steady gait with RW. Subjective weakness right knee. No buckling noted.    Stairs             Wheelchair Mobility    Modified Rankin (Stroke Patients Only)       Balance Overall balance assessment: Needs assistance Sitting-balance support: No upper extremity supported;Feet supported Sitting balance-Leahy Scale: Good Sitting balance - Comments: Able to donn brace sitting EOB with setup   Standing balance support: During functional activity;No upper extremity supported Standing balance-Leahy Scale: Fair Standing balance comment: pt washed hands at sink without LOB or leaning on counter                            Cognition Arousal/Alertness: Awake/alert Behavior During Therapy: WFL for tasks assessed/performed Overall Cognitive Status: Within Functional Limits for tasks assessed                                 General Comments: Reports no hallucinations since yesterday. Aware of these, now realizing she never changed rooms and things look different in hallway today since she is back to normal mentation.       Exercises      General Comments        Pertinent Vitals/Pain Pain Assessment:  Faces Faces Pain Scale: Hurts little more Pain Location: left hip with certain positions Pain Descriptors / Indicators: Sore;Guarding Pain Intervention(s): Monitored during session;Repositioned    Home Living                      Prior Function            PT Goals (current goals can now be found in the care plan section) Progress towards PT goals:  Progressing toward goals    Frequency    Min 4X/week      PT Plan Current plan remains appropriate    Co-evaluation              AM-PAC PT "6 Clicks" Mobility   Outcome Measure  Help needed turning from your back to your side while in a flat bed without using bedrails?: A Little Help needed moving from lying on your back to sitting on the side of a flat bed without using bedrails?: A Little Help needed moving to and from a bed to a chair (including a wheelchair)?: A Little Help needed standing up from a chair using your arms (e.g., wheelchair or bedside chair)?: A Little Help needed to walk in hospital room?: A Little Help needed climbing 3-5 steps with a railing? : A Little 6 Click Score: 18    End of Session Equipment Utilized During Treatment: Back brace Activity Tolerance: Patient tolerated treatment well Patient left: in chair;with call bell/phone within reach;with chair alarm set Nurse Communication: Mobility status PT Visit Diagnosis: Unsteadiness on feet (R26.81);Pain;Other symptoms and signs involving the nervous system (R29.898) Pain - Right/Left: Left Pain - part of body: Hip     Time: 7096-2836 PT Time Calculation (min) (ACUTE ONLY): 17 min  Charges:  $Gait Training: 8-22 mins                     Wray Kearns, PT, DPT [place] Pager [phone] Office [phone]    2   [name] 11/29/2018, 11:51 AM

## 2018-11-29 NOTE — Social Work (Signed)
Pt has chosen [facility]- will [employer].  PASRR still pending, CSW has paged the office, MD will need to sign electronic FL2 for PASRR; pt cannot go to SNF without this document.   CSW continuing to follow for support with disposition when medically appropriate.  Westley Hummer, MSW, [place] Work [phone]

## 2018-11-29 NOTE — Discharge Instructions (Signed)
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
## 2018-11-30 ENCOUNTER — Other Ambulatory Visit: Payer: Self-pay | Admitting: Internal Medicine

## 2018-11-30 MED ORDER — OXYCODONE HCL 5 MG PO TABS: 5.0000 mg | ORAL_TABLET | Freq: Four times a day (QID) | ORAL | 0 refills | Status: DC | PRN

## 2018-11-30 NOTE — Social Work (Addendum)
10:26am- Spoke with Dr. Rolena Infante, addendum was added for no change, PA also saw pt today with no change to summary. All information sent to SNF. When SNF has completed paperwork with pt will arrange PTAR.  10:19am- Authorization for SNF has been received, called [facility] to request updated d/c summary from MD or PA.  When updated will d/c pt via PTAR.  Westley Hummer, MSW, [place] Work [phone]

## 2018-11-30 NOTE — Progress Notes (Signed)
Report called to Hassell Halim of [facility] @ [phone].  Given call back number  if any further questions. Awaiting PTAR to transfer out. Elita Boone, BSN, RN

## 2018-11-30 NOTE — Care Management Important Message (Signed)
Important Message  Patient Details  Name: Pamela Sims MRN: [id number] Date of Birth: 24-Jun-1951   Medicare Important Message Given:  Yes    Shelda Altes 11/30/2018, 3:28 PM

## 2018-11-30 NOTE — Progress Notes (Signed)
Physical Therapy Treatment Patient Details Name: Pamela Sims MRN: [id number] DOB: Nov 30, 1950 Today's Date: 11/30/2018    History of Present Illness Pt is a 68 y/o female underwent evacuation of hematoma on 03-02 after a fall at home. Acute stay complicated by hallucinations. Pt recently admitted for L3-S1 PLIF on 11/22/2018 (with intraoperative durotomy). PMH significant for HTN, hiatal hernia, HA, diverticulosi, asthma, anxiety, ADHD, B TKR, ACDF C4-C6 2015.    PT Comments    Pt pleasant in bed and agreed to therapy. Pt is progressing very well towards PT goals. Pt educated on when to wear brace, sitting tolerance and activity progression. Pt reports she has a lot of questions for OT related to self care with her precautions. Pt would benefit from skilled therapy to continue progressing LE strength and endurance.   Follow Up Recommendations  SNF;Supervision/Assistance - 24 hour     Equipment Recommendations  None recommended by PT    Recommendations for Other Services       Precautions / Restrictions Precautions Precautions: Fall;Back Precaution Comments: pt able to independently state all precautions Required Braces or Orthoses: Spinal Brace Spinal Brace: Lumbar corset;Applied in sitting position    Mobility  Bed Mobility Overal bed mobility: Modified Independent Bed Mobility: Rolling;Sidelying to Sit Rolling: Supervision Sidelying to sit: Supervision       General bed mobility comments: bed flat with use of rail   Transfers Overall transfer level: Needs assistance Equipment used: Rolling walker (2 wheeled) Transfers: Sit to/from Stand(x2 trials from bed and toilet) Sit to Stand: Supervision         General transfer comment: cues for hand placement as pt pulling up on RW to stand from bed  Ambulation/Gait Ambulation/Gait assistance: Supervision Gait Distance (Feet): 150 Feet Assistive device: Rolling walker (2 wheeled);None Gait Pattern/deviations:  Step-through pattern     General Gait Details: pt ambulated step through pattern and increased gait speed today, therapist removed RW and pt able to ambulate without AD with step through pattern and no unsteadiness. pt reports feeling like her legs are weak   Chief Strategy Officer    Modified Rankin (Stroke Patients Only)       Balance     Sitting balance-Leahy Scale: Good       Standing balance-Leahy Scale: Fair Standing balance comment: pt washed hands at sink without LOB or leaning on counter, pt able to ambulate and perform sit to stands without AD                            Cognition Arousal/Alertness: Awake/alert Behavior During Therapy: WFL for tasks assessed/performed Overall Cognitive Status: Impaired/Different from baseline Area of Impairment: Memory                     Memory: Decreased recall of precautions         General Comments: pt unable to recall when to wear brace      Exercises Total Joint Exercises Long Arc Quad: AROM;Strengthening;Both;20 reps;Seated Other Exercises Other Exercises: pt performed 5 sit to stands from recliner with use of hands to push up from chair    General Comments        Pertinent Vitals/Pain Pain Score: 1  Pain Location: lower back Pain Descriptors / Indicators: Aching Pain Intervention(s): Limited activity within patient's tolerance    Home Living  Prior Function            PT Goals (current goals can now be found in the care plan section) Progress towards PT goals: Progressing toward goals    Frequency    Min 3X/week      PT Plan Current plan remains appropriate    Co-evaluation              AM-PAC PT "6 Clicks" Mobility   Outcome Measure  Help needed turning from your back to your side while in a flat bed without using bedrails?: A Little Help needed moving from lying on your back to sitting on the side of a flat  bed without using bedrails?: A Little Help needed moving to and from a bed to a chair (including a wheelchair)?: A Little Help needed standing up from a chair using your arms (e.g., wheelchair or bedside chair)?: None Help needed to walk in hospital room?: None Help needed climbing 3-5 steps with a railing? : A Little 6 Click Score: 20    End of Session Equipment Utilized During Treatment: Back brace;Gait belt Activity Tolerance: Patient tolerated treatment well Patient left: in chair;with call bell/phone within reach;with chair alarm set Nurse Communication: Mobility status PT Visit Diagnosis: Unsteadiness on feet (R26.81);Pain;Other symptoms and signs involving the nervous system (R29.898)     Time: 9476-5465 PT Time Calculation (min) (ACUTE ONLY): 24 min  Charges:  $Gait Training: 8-22 mins $Therapeutic Activity: 8-22 mins                     Pamela Sims, [place] [phone]    Pamela Sims 11/30/2018, 9:02 AM

## 2018-11-30 NOTE — Progress Notes (Signed)
Subjective: 4 Days Post-Op Procedure(s) (LRB): EVACUATION OF HEMATOMA LUMBAR (N/A) Patient reports pain as mild.  Tolerating PO diet without N/V. +BM, +flatus, +voiding. Denies sweats/chills, CP, HA, calf pain. Patient is up and ambulating with PT. Patient continues to have mild cough. No more reports of hallucinations.  Objective: Vital signs in last 24 hours: Temp:  [97.9 F (36.6 C)-98.6 F (37 C)] 98.2 F (36.8 C) (03/06 0819) Pulse Rate:  [79-87] 87 (03/06 0819) BP: (117-154)/(63-87) 117/79 (03/06 0819) SpO2:  [94 %-100 %] 97 % (03/06 0819)  Intake/Output from previous day: 03/05 0701 - 03/06 0700 In: 180 [P.O.:180] Out: -  Intake/Output this shift: Total I/O In: 480 [P.O.:480] Out: -   No results for input(s): HGB in the last 72 hours. No results for input(s): WBC, RBC, HCT, PLT in the last 72 hours. No results for input(s): NA, K, CL, CO2, BUN, CREATININE, GLUCOSE, CALCIUM in the last 72 hours. No results for input(s): LABPT, INR in the last 72 hours.  Neurologically intact ABD soft Neurovascular intact Sensation intact distally Intact pulses distally Dorsiflexion/Plantar flexion intact Incision: dressing C/D/I and no drainage No cellulitis present Compartment soft, no palpable cords, no calf tenderness, Homan's - Bilaterally.  Patient AAOx3 denies visual or auditory hallucinations   Assessment/Plan: 4 Days Post-Op Procedure(s) (LRB): EVACUATION OF HEMATOMA LUMBAR (N/A) DVT ppx: Teds, SCD's, ambulation/ OOB with PT Cough: Tessalon Pearls, robitussin PRN. Encourage IS. CXR has r/o pneumonia. Hallucinations: Resolved   Disposition: SNF today.   Anticipated LOS equal to or greater than 2 midnights due to - Age 67 and older with one or more of the following:  - Obesity  - Expected need for hospital services (PT, OT, Nursing) required for safe  discharge  - Anticipated need for postoperative skilled nursing care or inpatient rehab  - Active co-morbidities:  None OR   - Unanticipated findings during/Post Surgery: intraoperative duratomy (2/27), post-operative fall and hematoma (3/1), hematoma evacuation (3/1), postoperative hallucinations, postoperative cough      Yvonne Kendall Ward 11/30/2018, 10:14 AM
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
## 2018-11-30 NOTE — Social Work (Signed)
Clinical Social Worker facilitated patient discharge including contacting patient family and facility to confirm patient discharge plans.  Clinical information faxed to facility and family agreeable with plan.  CSW arranged ambulance transport via [facility] to [place] to call [phone] with report prior to discharge.  Clinical Social Worker will sign off for now as social work intervention is no longer needed. Please consult [place] again if new need arises.  Westley Hummer, MSW, [place] Social Worker [phone]

## 2018-11-30 NOTE — Clinical Social Work Placement (Signed)
   CLINICAL SOCIAL WORK PLACEMENT  NOTE Andree [place] RN to call report to [phone]  Date:  11/30/2018  Patient Details  Name: Pamela Sims MRN: [id number] Date of Birth: [birth year]  Clinical Social Work is seeking post-discharge placement for this patient at the [place] level of care (*CSW will initial, date and re-position this form in  chart as items are completed):  Yes   Patient/family provided with [place] Work Department's list of facilities offering this level of care within the geographic area requested by the patient (or if unable, by the patient's family).  Yes   Patient/family informed of their freedom to choose among providers that offer the needed level of care, that participate in Medicare, Medicaid or managed care program needed by the patient, have an available bed and are willing to accept the patient.  Yes   Patient/family informed of [facility]'s ownership interest in [facility] and [facility], as well as of the fact that they are under no obligation to receive care at these facilities.  PASRR submitted to EDS on 11/27/18     PASRR number received on 11/29/18     Existing PASRR number confirmed on       FL2 transmitted to all facilities in geographic area requested by pt/family on 11/27/18     FL2 transmitted to all facilities within larger geographic area on       Patient informed that his/her managed care company has contracts with or will negotiate with certain facilities, including the following:        Yes   Patient/family informed of bed offers received.  Patient chooses bed at [facility]     Physician recommends and patient chooses bed at      Patient to be transferred to [facility] on 11/30/18.  Patient to be transferred to facility by [facility]     Patient family notified on 11/30/18 of transfer.  Name of family member notified:  pt a&o will contact her family.        PHYSICIAN       Additional Comment:    _______________________________________________ Alexander Mt, LCSWA 11/30/2018, 10:28 AM

## 2018-12-03 ENCOUNTER — Encounter: Payer: Self-pay | Admitting: Internal Medicine

## 2018-12-03 ENCOUNTER — Non-Acute Institutional Stay (SKILLED_NURSING_FACILITY): Payer: Medicare Other | Admitting: Internal Medicine

## 2018-12-03 DIAGNOSIS — Z9889 Other specified postprocedural states: Secondary | ICD-10-CM | POA: Diagnosis not present

## 2018-12-03 DIAGNOSIS — I10 Essential (primary) hypertension: Secondary | ICD-10-CM

## 2018-12-03 DIAGNOSIS — G9761 Postprocedural hematoma of a nervous system organ or structure following a nervous system procedure: Secondary | ICD-10-CM | POA: Diagnosis not present

## 2018-12-03 DIAGNOSIS — E559 Vitamin D deficiency, unspecified: Secondary | ICD-10-CM

## 2018-12-03 DIAGNOSIS — R443 Hallucinations, unspecified: Secondary | ICD-10-CM

## 2018-12-03 DIAGNOSIS — G629 Polyneuropathy, unspecified: Secondary | ICD-10-CM
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
## 2018-12-05 ENCOUNTER — Encounter: Payer: Self-pay | Admitting: Internal Medicine

## 2018-12-06 DIAGNOSIS — Z9889 Other specified postprocedural states: Secondary | ICD-10-CM

## 2018-12-06 DIAGNOSIS — E559 Vitamin D deficiency, unspecified: Secondary | ICD-10-CM | POA: Insufficient documentation

## 2018-12-06 DIAGNOSIS — G629 Polyneuropathy, unspecified: Secondary | ICD-10-CM | POA: Insufficient documentation

## 2018-12-06 DIAGNOSIS — G9761 Postprocedural hematoma of a nervous system organ or structure following a nervous system procedure: Secondary | ICD-10-CM

## 2018-12-06 HISTORY — DX: Other specified postprocedural states: Z98.890

## 2018-12-06 HISTORY — DX: Postprocedural hematoma of a nervous system organ or structure following a nervous system procedure: G97.61

## 2018-12-18 ENCOUNTER — Other Ambulatory Visit: Payer: Self-pay | Admitting: Adult Health

## 2018-12-18 ENCOUNTER — Non-Acute Institutional Stay (SKILLED_NURSING_FACILITY): Payer: Medicare Other | Admitting: Adult Health

## 2018-12-18 ENCOUNTER — Encounter: Payer: Self-pay | Admitting: Adult Health

## 2018-12-18 DIAGNOSIS — G9761 Postprocedural hematoma of a nervous system organ or structure following a nervous system procedure: Secondary | ICD-10-CM

## 2018-12-18 DIAGNOSIS — I10 Essential (primary) hypertension: Secondary | ICD-10-CM

## 2018-12-18 DIAGNOSIS — M4800 Spinal stenosis, site unspecified: Secondary | ICD-10-CM | POA: Diagnosis not present

## 2018-12-18 MED ORDER — AMLODIPINE BESYLATE 10 MG PO TABS: ORAL_TABLET | ORAL | 0 refills | Status: DC

## 2018-12-18 MED ORDER — DULOXETINE HCL 30 MG PO CPEP: 30.0000 mg | ORAL_CAPSULE | Freq: Every day | ORAL | 0 refills | Status: DC

## 2018-12-18 MED ORDER — AMOXICILLIN 500 MG PO CAPS: 2000.0000 mg | ORAL_CAPSULE | ORAL | 0 refills | Status: DC
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
## 2019-02-15 DIAGNOSIS — M6701 Short Achilles tendon (acquired), right ankle: Secondary | ICD-10-CM

## 2019-02-15 DIAGNOSIS — M21372 Foot drop, left foot: Secondary | ICD-10-CM

## 2019-02-15 DIAGNOSIS — M7730 Calcaneal spur, unspecified foot: Secondary | ICD-10-CM

## 2019-02-15 DIAGNOSIS — E669 Obesity, unspecified: Secondary | ICD-10-CM | POA: Insufficient documentation

## 2019-02-15 HISTORY — DX: Short Achilles tendon (acquired), right ankle: M67.01

## 2019-02-15 HISTORY — DX: Calcaneal spur, unspecified foot: M77.30

## 2019-02-15 HISTORY — DX: Foot drop, left foot: M21.372

## 2019-06-16 ENCOUNTER — Other Ambulatory Visit: Payer: Self-pay

## 2019-06-16 ENCOUNTER — Emergency Department (HOSPITAL_BASED_OUTPATIENT_CLINIC_OR_DEPARTMENT_OTHER)
Admission: EM | Admit: 2019-06-16 | Discharge: 2019-06-16 | Disposition: A | Discharge status: Home / Self Care | Payer: Medicare Other | Attending: Emergency Medicine | Admitting: Emergency Medicine

## 2019-06-16 ENCOUNTER — Emergency Department (HOSPITAL_BASED_OUTPATIENT_CLINIC_OR_DEPARTMENT_OTHER): Payer: Medicare Other

## 2019-06-16 ENCOUNTER — Encounter (HOSPITAL_BASED_OUTPATIENT_CLINIC_OR_DEPARTMENT_OTHER): Payer: Self-pay | Admitting: *Deleted

## 2019-06-16 DIAGNOSIS — R059 Cough, unspecified: Secondary | ICD-10-CM

## 2019-06-16 DIAGNOSIS — J209 Acute bronchitis, unspecified: Secondary | ICD-10-CM | POA: Insufficient documentation

## 2019-06-16 DIAGNOSIS — Z96653 Presence of artificial knee joint, bilateral: Secondary | ICD-10-CM | POA: Diagnosis not present

## 2019-06-16 DIAGNOSIS — Z87898 Personal history of other specified conditions: Secondary | ICD-10-CM

## 2019-06-16 DIAGNOSIS — Z8709 Personal history of other diseases of the respiratory system: Secondary | ICD-10-CM | POA: Diagnosis not present

## 2019-06-16 DIAGNOSIS — I10 Essential (primary) hypertension: Secondary | ICD-10-CM | POA: Insufficient documentation

## 2019-06-16 DIAGNOSIS — R05 Cough: Secondary | ICD-10-CM

## 2019-06-16 DIAGNOSIS — J45909 Unspecified asthma, uncomplicated: Secondary | ICD-10-CM | POA: Diagnosis not present

## 2019-06-16 DIAGNOSIS — Z79899 Other long term (current) drug therapy: Secondary | ICD-10-CM | POA: Insufficient documentation

## 2019-06-16 DIAGNOSIS — Z87891 Personal history of nicotine dependence: Secondary | ICD-10-CM | POA: Diagnosis not present

## 2019-06-16 MED ORDER — ALBUTEROL SULFATE HFA 108 (90 BASE) MCG/ACT IN AERS
2.0000 | INHALATION_SPRAY | RESPIRATORY_TRACT | Status: DC | PRN
  Filled 2019-06-16: qty 6.7

## 2019-06-16 MED ORDER — BENZONATATE 100 MG PO CAPS: 100.0000 mg | ORAL_CAPSULE | Freq: Three times a day (TID) | ORAL | 0 refills | Status: DC | PRN

## 2019-06-16 MED ORDER — DOXYCYCLINE HYCLATE 100 MG PO TABS
100.0000 mg | ORAL_TABLET | Freq: Once | ORAL | Status: AC
  Administered 2019-06-16: 100 mg via ORAL
  Filled 2019-06-16: qty 1

## 2019-06-16 MED ORDER — GUAIFENESIN 100 MG/5ML PO SOLN
15.0000 mL | Freq: Once | ORAL | Status: AC
  Administered 2019-06-16: 300 mg via ORAL
  Filled 2019-06-16: qty 10

## 2019-06-16 MED ORDER — IPRATROPIUM BROMIDE HFA 17 MCG/ACT IN AERS
2.0000 | INHALATION_SPRAY | Freq: Once | RESPIRATORY_TRACT | Status: DC
  Filled 2019-06-16: qty 12.9

## 2019-06-16 MED ORDER — DOXYCYCLINE HYCLATE 100 MG PO CAPS: 100.0000 mg | ORAL_CAPSULE | Freq: Two times a day (BID) | ORAL | 0 refills | Status: DC

## 2019-06-16 NOTE — Discharge Instructions (Addendum)
It was our pleasure to provide your ER care today - we hope that you feel better.  Take antibiotic as prescribed. You may try tessalon as need for cough.  If reflux symptoms, try taking pepcid, and/or maalox as need.   Use albuterol inhaler as need if wheezing.   Follow up with your doctor/pulmonary doctor in the next couple weeks - also have your blood pressure rechecked then as it is high tonight - also continue your blood pressure medication, and limit salt intake.   Return to ER if worse, new symptoms, high fevers, increased trouble breathing, chest pain, or other concern.
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
## 2019-06-16 NOTE — ED Notes (Signed)
ED Provider at bedside. 

## 2019-06-16 NOTE — ED Notes (Addendum)
This EMT went into room to offer a wheelchair for discharge and Pt stated "I'm is not leaving until her cough gets better." EMT informed charge nurse.

## 2019-06-16 NOTE — ED Notes (Signed)
This RN went into discharge pt. Pt concerned about blood pressure. BP rechecked and read as 157/73. Pt not wanting to go home because she is still coughing and noone is at home. Pt continues to be concerned about her BP. RN offered the inhalers again that pt refused before. RN also reviewed medications already given and medications given as prescriptions. Pt states "the inhalers don't work and pointed to Symbicort" this RN explained the difference between Symbicort and the rescue inhalers. Pt refused again. Pt educated about BP and encouraged to continue her home medications. Pt concerned she is going to have a heart attack because her pressure was 180/100 at home. Pt denies chest pain. Pt denies SOB at this time. Pt VSS. Pt asked rn "what if I get home and I cant drive". RN encouraged pt to call 911 and pt stated "I am not calling 911".

## 2019-06-16 NOTE — ED Notes (Signed)
Pt refused wheelchair.  

## 2019-06-16 NOTE — ED Triage Notes (Addendum)
Pt reports feeling SOB today. She has a chronic cough x 2 years. She is concerned bc her bp was elevated tonight 170/98. Her grandson who lives with her is gone tonight and she states she was concerned her symptoms would get worse and she wouldn't be able to drive herself to the ED if she needed to

## 2019-06-16 NOTE — ED Notes (Signed)
Patient transported to X-ray 

## 2019-06-27 ENCOUNTER — Other Ambulatory Visit: Payer: Self-pay | Admitting: Orthopedic Surgery

## 2019-06-27 DIAGNOSIS — M545 Low back pain, unspecified: Secondary | ICD-10-CM

## 2019-07-04 ENCOUNTER — Other Ambulatory Visit: Payer: Self-pay

## 2019-07-04 ENCOUNTER — Other Ambulatory Visit: Payer: Medicare Other

## 2019-07-04 ENCOUNTER — Ambulatory Visit
Admission: RE | Admit: 2019-07-04 | Discharge: 2019-07-04 | Disposition: A | Discharge status: Home / Self Care | Payer: Medicare Other | Source: Ambulatory Visit | Attending: Orthopedic Surgery | Admitting: Orthopedic Surgery

## 2019-07-04 DIAGNOSIS — M545 Low back pain, unspecified: Secondary | ICD-10-CM

## 2019-07-25 DIAGNOSIS — G629 Polyneuropathy, unspecified: Secondary | ICD-10-CM | POA: Insufficient documentation

## 2019-07-25 DIAGNOSIS — F331 Major depressive disorder, recurrent, moderate: Secondary | ICD-10-CM

## 2019-07-25 HISTORY — DX: Polyneuropathy, unspecified: G62.9

## 2019-07-25 HISTORY — DX: Major depressive disorder, recurrent, moderate: F33.1

## 2019-07-26 DIAGNOSIS — Z90710 Acquired absence of both cervix and uterus: Secondary | ICD-10-CM | POA: Insufficient documentation

## 2019-07-26 DIAGNOSIS — Z9049 Acquired absence of other specified parts of digestive tract: Secondary | ICD-10-CM

## 2019-07-26 HISTORY — DX: Acquired absence of other specified parts of digestive tract: Z90.49

## 2019-07-26 HISTORY — DX: Acquired absence of both cervix and uterus: Z90.710

## 2019-07-28 ENCOUNTER — Other Ambulatory Visit: Payer: Self-pay

## 2019-07-28 ENCOUNTER — Emergency Department (HOSPITAL_BASED_OUTPATIENT_CLINIC_OR_DEPARTMENT_OTHER)
Admission: EM | Admit: 2019-07-28 | Discharge: 2019-07-28 | Disposition: A | Discharge status: Home / Self Care | Payer: Medicare Other | Attending: Emergency Medicine | Admitting: Emergency Medicine

## 2019-07-28 ENCOUNTER — Emergency Department (HOSPITAL_BASED_OUTPATIENT_CLINIC_OR_DEPARTMENT_OTHER): Payer: Medicare Other

## 2019-07-28 ENCOUNTER — Encounter (HOSPITAL_BASED_OUTPATIENT_CLINIC_OR_DEPARTMENT_OTHER): Payer: Self-pay | Admitting: Emergency Medicine

## 2019-07-28 DIAGNOSIS — Z87891 Personal history of nicotine dependence: Secondary | ICD-10-CM | POA: Insufficient documentation

## 2019-07-28 DIAGNOSIS — I10 Essential (primary) hypertension: Secondary | ICD-10-CM | POA: Diagnosis not present

## 2019-07-28 DIAGNOSIS — R072 Precordial pain: Secondary | ICD-10-CM | POA: Insufficient documentation

## 2019-07-28 DIAGNOSIS — J45909 Unspecified asthma, uncomplicated: Secondary | ICD-10-CM | POA: Insufficient documentation

## 2019-07-28 DIAGNOSIS — Z79899 Other long term (current) drug therapy: Secondary | ICD-10-CM | POA: Diagnosis not present

## 2019-07-28 DIAGNOSIS — R0789 Other chest pain: Secondary | ICD-10-CM | POA: Diagnosis present

## 2019-07-28 LAB — BASIC METABOLIC PANEL
Anion gap: 10 (ref 5–15)
BUN: 13 mg/dL (ref 8–23)
CO2: 23 mmol/L (ref 22–32)
Calcium: 9.3 mg/dL (ref 8.9–10.3)
Chloride: 106 mmol/L (ref 98–111)
Creatinine, Ser: 1.01 mg/dL — ABNORMAL HIGH (ref 0.44–1.00)
GFR calc Af Amer: >60 mL/min (ref >60)
GFR calc non Af Amer: 58 mL/min — ABNORMAL LOW (ref >60)
Glucose, Bld: 104 mg/dL — ABNORMAL HIGH (ref 70–99)
Potassium: 3.9 mmol/L (ref 3.5–5.1)
Sodium: 139 mmol/L (ref 135–145)

## 2019-07-28 LAB — CBC
HCT: 41.8 % (ref 36.0–46.0)
Hemoglobin: 14.1 g/dL (ref 12.0–15.0)
MCH: 28.4 pg (ref 26.0–34.0)
MCHC: 33.7 g/dL (ref 30.0–36.0)
MCV: 84.1 fL (ref 80.0–100.0)
Platelets: 151 10*3/uL (ref 150–400)
RBC: 4.97 MIL/uL (ref 3.87–5.11)
RDW: 13.9 % (ref 11.5–15.5)
WBC: 4 10*3/uL (ref 4.0–10.5)
nRBC: 0 % (ref 0.0–0.2)

## 2019-07-28 LAB — TROPONIN I (HIGH SENSITIVITY): Troponin I (High Sensitivity): <2 ng/L (ref <18)

## 2019-07-28 MED ORDER — BENZONATATE 100 MG PO CAPS: 100.0000 mg | ORAL_CAPSULE | Freq: Three times a day (TID) | ORAL | 1 refills | Status: DC | PRN

## 2019-07-28 MED ORDER — CLONIDINE HCL 0.1 MG PO TABS: ORAL_TABLET | ORAL | 11 refills | Status: DC

## 2019-07-28 MED ORDER — ASPIRIN 81 MG PO CHEW
324.0000 mg | CHEWABLE_TABLET | Freq: Once | ORAL | Status: AC
  Administered 2019-07-28: 243 mg via ORAL
  Filled 2019-07-28: qty 4

## 2019-07-28 MED ORDER — AMLODIPINE BESYLATE 5 MG PO TABS
5.0000 mg | ORAL_TABLET | Freq: Once | ORAL | Status: AC
  Administered 2019-07-28: 5 mg via ORAL
  Filled 2019-07-28: qty 1

## 2019-07-28 MED ORDER — CLONIDINE HCL 0.1 MG PO TABS
0.1000 mg | ORAL_TABLET | Freq: Once | ORAL | Status: AC
  Administered 2019-07-28: 0.1 mg via ORAL
  Filled 2019-07-28: qty 1

## 2019-07-28 NOTE — ED Triage Notes (Signed)
Pt sts she is here to have her BP checked, however, she also c/o LT CP under breast since about 2am; reports lips feel numb x about 20 mins

## 2019-07-28 NOTE — ED Notes (Addendum)
Pt sts she has had a cough for 2 yrs; it has been worked up by PCP, GI, ENT, cardiology and pulmonology

## 2019-07-28 NOTE — Discharge Instructions (Addendum)
1.  Continue taking your amlodipine 5 mg daily and monitor your blood pressure.  If blood pressures are greater than 150/90, you may take a clonidine tablet up to every 8 hours.  Continue to keep a log of your blood pressures. 2.  Follow-up with your doctor with your recorded blood pressures.  You and your doctor will need to decide on either increasing your amlodipine dose back to 10 mg or adding a permanent second blood pressure agent if blood pressures are not adequately controlled. 3.  Return to the emergency department if you have chest pain, shortness of breath, feel like you are passing out or any other concerning symptoms.
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
## 2019-09-03 DIAGNOSIS — G8929 Other chronic pain: Secondary | ICD-10-CM | POA: Insufficient documentation

## 2019-09-03 DIAGNOSIS — R0789 Other chest pain: Secondary | ICD-10-CM | POA: Insufficient documentation

## 2019-11-22 ENCOUNTER — Other Ambulatory Visit (HOSPITAL_COMMUNITY): Payer: Self-pay | Admitting: Orthopedic Surgery

## 2019-12-23 LAB — HM COLONOSCOPY

## 2019-12-30 ENCOUNTER — Other Ambulatory Visit: Payer: Self-pay | Admitting: Obstetrics and Gynecology

## 2019-12-30 DIAGNOSIS — Z1231 Encounter for screening mammogram for malignant neoplasm of breast: Secondary | ICD-10-CM

## 2020-01-08 ENCOUNTER — Encounter (HOSPITAL_BASED_OUTPATIENT_CLINIC_OR_DEPARTMENT_OTHER): Payer: Self-pay | Admitting: *Deleted

## 2020-01-08 ENCOUNTER — Other Ambulatory Visit: Payer: Self-pay

## 2020-01-08 ENCOUNTER — Emergency Department (HOSPITAL_BASED_OUTPATIENT_CLINIC_OR_DEPARTMENT_OTHER): Payer: Medicare PPO

## 2020-01-08 ENCOUNTER — Emergency Department (HOSPITAL_BASED_OUTPATIENT_CLINIC_OR_DEPARTMENT_OTHER)
Admission: EM | Admit: 2020-01-08 | Discharge: 2020-01-08 | Disposition: A | Discharge status: Home / Self Care | Payer: Medicare PPO | Attending: Emergency Medicine | Admitting: Emergency Medicine

## 2020-01-08 DIAGNOSIS — I10 Essential (primary) hypertension: Secondary | ICD-10-CM | POA: Insufficient documentation

## 2020-01-08 DIAGNOSIS — Z79899 Other long term (current) drug therapy: Secondary | ICD-10-CM | POA: Diagnosis not present

## 2020-01-08 DIAGNOSIS — Z96653 Presence of artificial knee joint, bilateral: Secondary | ICD-10-CM | POA: Diagnosis not present

## 2020-01-08 DIAGNOSIS — F909 Attention-deficit hyperactivity disorder, unspecified type: Secondary | ICD-10-CM | POA: Insufficient documentation

## 2020-01-08 DIAGNOSIS — Y999 Unspecified external cause status: Secondary | ICD-10-CM | POA: Diagnosis not present

## 2020-01-08 DIAGNOSIS — S46812A Strain of other muscles, fascia and tendons at shoulder and upper arm level, left arm, initial encounter: Secondary | ICD-10-CM | POA: Diagnosis not present

## 2020-01-08 DIAGNOSIS — Y939 Activity, unspecified: Secondary | ICD-10-CM | POA: Diagnosis not present

## 2020-01-08 DIAGNOSIS — J45909 Unspecified asthma, uncomplicated: Secondary | ICD-10-CM | POA: Diagnosis not present

## 2020-01-08 DIAGNOSIS — Z87891 Personal history of nicotine dependence: Secondary | ICD-10-CM | POA: Insufficient documentation

## 2020-01-08 DIAGNOSIS — X58XXXA Exposure to other specified factors, initial encounter: Secondary | ICD-10-CM | POA: Diagnosis not present

## 2020-01-08 DIAGNOSIS — Y929 Unspecified place or not applicable: Secondary | ICD-10-CM | POA: Diagnosis not present

## 2020-01-08 DIAGNOSIS — R519 Headache, unspecified: Secondary | ICD-10-CM | POA: Diagnosis present

## 2020-01-08 LAB — COMPREHENSIVE METABOLIC PANEL
ALT: 11 U/L (ref 0–44)
AST: 17 U/L (ref 15–41)
Albumin: 4.3 g/dL (ref 3.5–5.0)
Alkaline Phosphatase: 73 U/L (ref 38–126)
Anion gap: 10 (ref 5–15)
BUN: 16 mg/dL (ref 8–23)
CO2: 25 mmol/L (ref 22–32)
Calcium: 9.2 mg/dL (ref 8.9–10.3)
Chloride: 105 mmol/L (ref 98–111)
Creatinine, Ser: 0.87 mg/dL (ref 0.44–1.00)
GFR calc Af Amer: >60 mL/min (ref >60)
GFR calc non Af Amer: >60 mL/min (ref >60)
Glucose, Bld: 102 mg/dL — ABNORMAL HIGH (ref 70–99)
Potassium: 3.9 mmol/L (ref 3.5–5.1)
Sodium: 140 mmol/L (ref 135–145)
Total Bilirubin: 0.4 mg/dL (ref 0.3–1.2)
Total Protein: 6.9 g/dL (ref 6.5–8.1)

## 2020-01-08 LAB — CBC WITH DIFFERENTIAL/PLATELET
Abs Immature Granulocytes: 0.02 10*3/uL (ref 0.00–0.07)
Basophils Absolute: 0.1 10*3/uL (ref 0.0–0.1)
Basophils Relative: 1 %
Eosinophils Absolute: 0.2 10*3/uL (ref 0.0–0.5)
Eosinophils Relative: 2 %
HCT: 41.9 % (ref 36.0–46.0)
Hemoglobin: 14.1 g/dL (ref 12.0–15.0)
Immature Granulocytes: 0 %
Lymphocytes Relative: 21 %
Lymphs Abs: 1.5 10*3/uL (ref 0.7–4.0)
MCH: 29.4 pg (ref 26.0–34.0)
MCHC: 33.7 g/dL (ref 30.0–36.0)
MCV: 87.5 fL (ref 80.0–100.0)
Monocytes Absolute: 0.6 10*3/uL (ref 0.1–1.0)
Monocytes Relative: 8 %
Neutro Abs: 4.7 10*3/uL (ref 1.7–7.7)
Neutrophils Relative %: 68 %
Platelets: 156 10*3/uL (ref 150–400)
RBC: 4.79 MIL/uL (ref 3.87–5.11)
RDW: 14.1 % (ref 11.5–15.5)
WBC: 7 10*3/uL (ref 4.0–10.5)
nRBC: 0 % (ref 0.0–0.2)

## 2020-01-08 MED ORDER — PROCHLORPERAZINE EDISYLATE 10 MG/2ML IJ SOLN
5.0000 mg | Freq: Once | INTRAMUSCULAR | Status: AC
  Administered 2020-01-08: 21:00:00 5 mg via INTRAVENOUS
  Filled 2020-01-08: qty 2

## 2020-01-08 MED ORDER — DIPHENHYDRAMINE HCL 50 MG/ML IJ SOLN
12.5000 mg | Freq: Once | INTRAMUSCULAR | Status: AC
  Administered 2020-01-08: 12.5 mg via INTRAVENOUS
  Filled 2020-01-08: qty 1
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
## 2020-01-08 NOTE — Discharge Instructions (Signed)
Follow up with your doctor in the office.  Return for worsening symptoms.  
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
## 2020-01-08 NOTE — ED Notes (Signed)
Patient transported to CT 

## 2020-01-09 ENCOUNTER — Ambulatory Visit
Admission: RE | Admit: 2020-01-09 | Discharge: 2020-01-09 | Disposition: A | Discharge status: Home / Self Care | Payer: Medicare PPO | Source: Ambulatory Visit | Attending: Obstetrics and Gynecology | Admitting: Obstetrics and Gynecology

## 2020-01-09 DIAGNOSIS — Z1231 Encounter for screening mammogram for malignant neoplasm of breast: Secondary | ICD-10-CM

## 2020-01-23 DIAGNOSIS — M21622 Bunionette of left foot: Secondary | ICD-10-CM | POA: Insufficient documentation

## 2020-01-23 DIAGNOSIS — M2022 Hallux rigidus, left foot: Secondary | ICD-10-CM

## 2020-01-23 HISTORY — DX: Bunionette of left foot: M21.622

## 2020-01-23 HISTORY — DX: Hallux rigidus, left foot: M20.22

## 2020-01-30 DIAGNOSIS — Z8719 Personal history of other diseases of the digestive system: Secondary | ICD-10-CM

## 2020-01-30 HISTORY — DX: Personal history of other diseases of the digestive system: Z87.19

## 2020-11-10 ENCOUNTER — Emergency Department (HOSPITAL_BASED_OUTPATIENT_CLINIC_OR_DEPARTMENT_OTHER): Payer: Medicare PPO

## 2020-11-10 ENCOUNTER — Encounter (HOSPITAL_BASED_OUTPATIENT_CLINIC_OR_DEPARTMENT_OTHER): Payer: Self-pay

## 2020-11-10 ENCOUNTER — Emergency Department (HOSPITAL_BASED_OUTPATIENT_CLINIC_OR_DEPARTMENT_OTHER)
Admission: EM | Admit: 2020-11-10 | Discharge: 2020-11-11 | Disposition: A | Discharge status: Home / Self Care | Payer: Medicare PPO | Attending: Emergency Medicine | Admitting: Emergency Medicine

## 2020-11-10 ENCOUNTER — Other Ambulatory Visit: Payer: Self-pay

## 2020-11-10 DIAGNOSIS — I10 Essential (primary) hypertension: Secondary | ICD-10-CM | POA: Insufficient documentation

## 2020-11-10 DIAGNOSIS — K449 Diaphragmatic hernia without obstruction or gangrene: Secondary | ICD-10-CM | POA: Diagnosis not present

## 2020-11-10 DIAGNOSIS — M546 Pain in thoracic spine: Secondary | ICD-10-CM | POA: Insufficient documentation

## 2020-11-10 DIAGNOSIS — Z87891 Personal history of nicotine dependence: Secondary | ICD-10-CM | POA: Insufficient documentation

## 2020-11-10 DIAGNOSIS — R200 Anesthesia of skin: Secondary | ICD-10-CM | POA: Diagnosis not present

## 2020-11-10 DIAGNOSIS — Z96651 Presence of right artificial knee joint: Secondary | ICD-10-CM | POA: Diagnosis not present

## 2020-11-10 DIAGNOSIS — J45909 Unspecified asthma, uncomplicated: Secondary | ICD-10-CM | POA: Diagnosis not present

## 2020-11-10 HISTORY — DX: Polyneuropathy, unspecified: G62.9

## 2020-11-10 LAB — CBC WITH DIFFERENTIAL/PLATELET
Abs Immature Granulocytes: 0.01 10*3/uL (ref 0.00–0.07)
Basophils Absolute: 0 10*3/uL (ref 0.0–0.1)
Basophils Relative: 1 %
Eosinophils Absolute: 0.1 10*3/uL (ref 0.0–0.5)
Eosinophils Relative: 2 %
HCT: 40.5 % (ref 36.0–46.0)
Hemoglobin: 13.9 g/dL (ref 12.0–15.0)
Immature Granulocytes: 0 %
Lymphocytes Relative: 20 %
Lymphs Abs: 1.3 10*3/uL (ref 0.7–4.0)
MCH: 29.4 pg (ref 26.0–34.0)
MCHC: 34.3 g/dL (ref 30.0–36.0)
MCV: 85.8 fL (ref 80.0–100.0)
Monocytes Absolute: 0.5 10*3/uL (ref 0.1–1.0)
Monocytes Relative: 7 %
Neutro Abs: 4.7 10*3/uL (ref 1.7–7.7)
Neutrophils Relative %: 70 %
Platelets: 156 10*3/uL (ref 150–400)
RBC: 4.72 MIL/uL (ref 3.87–5.11)
RDW: 13.7 % (ref 11.5–15.5)
WBC: 6.6 10*3/uL (ref 4.0–10.5)
nRBC: 0 % (ref 0.0–0.2)

## 2020-11-10 LAB — D-DIMER, QUANTITATIVE: D-Dimer, Quant: 1.32 ug/mL-FEU — ABNORMAL HIGH (ref 0.00–0.50)

## 2020-11-10 LAB — BASIC METABOLIC PANEL
Anion gap: 10 (ref 5–15)
BUN: 14 mg/dL (ref 8–23)
CO2: 26 mmol/L (ref 22–32)
Calcium: 8.9 mg/dL (ref 8.9–10.3)
Chloride: 102 mmol/L (ref 98–111)
Creatinine, Ser: 0.84 mg/dL (ref 0.44–1.00)
GFR, Estimated: >60 mL/min (ref >60)
Glucose, Bld: 101 mg/dL — ABNORMAL HIGH (ref 70–99)
Potassium: 3.6 mmol/L (ref 3.5–5.1)
Sodium: 138 mmol/L (ref 135–145)

## 2020-11-10 LAB — TROPONIN I (HIGH SENSITIVITY): Troponin I (High Sensitivity): 2 ng/L (ref <18)

## 2020-11-10 MED ORDER — NITROGLYCERIN 0.4 MG SL SUBL
0.4000 mg | SUBLINGUAL_TABLET | Freq: Once | SUBLINGUAL | Status: AC
  Administered 2020-11-10: 0.4 mg via SUBLINGUAL
  Filled 2020-11-10: qty 1

## 2020-11-10 MED ORDER — RAMIPRIL 5 MG PO CAPS
5.0000 mg | ORAL_CAPSULE | Freq: Every day | ORAL | Status: DC
  Administered 2020-11-10: 5 mg via ORAL
  Filled 2020-11-10: qty 1

## 2020-11-10 MED ORDER — IOHEXOL 350 MG/ML SOLN
100.0000 mL | Freq: Once | INTRAVENOUS | Status: AC | PRN
  Administered 2020-11-10: 100 mL via INTRAVENOUS

## 2020-11-10 MED ORDER — HYDRALAZINE HCL 20 MG/ML IJ SOLN
10.0000 mg | Freq: Once | INTRAMUSCULAR | Status: AC
  Administered 2020-11-11: 10 mg via INTRAVENOUS
  Filled 2020-11-10: qty 1

## 2020-11-10 NOTE — ED Triage Notes (Addendum)
Pt c/o elevated BP x 1.5 weeks-states she was seen by PCP yesterday-increase in BP meds and labs (results in epic)-pt c/o numbness to both hands x 15 min ~930am-numbness to lips "all day today"-pain to "shoulder blades"-NAD-steady gait
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
## 2020-11-10 NOTE — ED Notes (Signed)
EDP at bedside  

## 2020-11-11 ENCOUNTER — Telehealth: Payer: Self-pay

## 2020-11-11 ENCOUNTER — Telehealth (HOSPITAL_COMMUNITY): Payer: Self-pay | Admitting: Emergency Medicine

## 2020-11-11 LAB — TROPONIN I (HIGH SENSITIVITY): Troponin I (High Sensitivity): 3 ng/L (ref <18)

## 2020-11-11 NOTE — ED Notes (Addendum)
Pt got dizzy after bending to put on shoes; EDP wants pt to sleep here for a few hours since pt drove herself, says pt does not need to be placed back on monitor, vitals to be held until patient is ready to leave

## 2020-11-11 NOTE — ED Provider Notes (Signed)
I assumed care in signout to follow-up on labs.  Repeat troponin is unremarkable.  Blood pressure is much improved.  On my evaluation, patient reports feeling improved.  She denies any chest pain or pleuritic chest pain.  Back pain is resolved.  She reports chronic abdominal pain but no other acute complaints.  On exam there are no murmurs, lung sounds are clear, no focal weakness, distal pulses equal and intact She is appropriate for discharge home.  Discussed need to monitor blood pressure at home and follow-up with PCP    Ripley Fraise, MD 11/11/20 [phone]

## 2020-11-11 NOTE — Telephone Encounter (Signed)
Contacted patient to schedule new patient appointment tomorrow. Patient reports "blood spots" on her hands. Describes as little pin pricks all over. No pain currently, has experienced numbness from risk occasionally off and on for past day in lips and hands.

## 2020-11-11 NOTE — ED Notes (Signed)
Pt ambulated to restroom and back  Pt standing by the bed and became very dizzy and off balance  VS taken  EDP made aware

## 2020-11-11 NOTE — ED Notes (Signed)
EDP at bedside  

## 2020-11-11 NOTE — Telephone Encounter (Signed)
Pt instructed to return to the ED for an evaluation if numbness. Pt has an appt tomorrow with Dr. Harriet Masson.

## 2020-11-11 NOTE — ED Provider Notes (Signed)
Patient walked to the bathroom around 130 and felt very lightheaded.  No falls.  No syncope. No focal weakness reported.  EKG Interpretation  Date/Time:  Wednesday November 11 2020 01:56:25 EST Ventricular Rate:  60 PR Interval:  152 QRS Duration: 99 QT Interval:  465 QTC Calculation: 465 R Axis:   58 Text Interpretation: Sinus rhythm No significant change since last tracing Confirmed by Ripley Fraise [phone]) on 11/11/2020 3:05:12 AM      Repeat EKG unchanged After she got back to the room, orthostatic vital signs did not show significant difference.  However its possible since patient was in her bed for several hours receiving blood pressure medicines, this could have contributed to her symptoms No signs of stroke  3:06 AM Patient was monitored is now feeling improved.  She ambulated independently without any difficulty.  She feels comfortable for discharge home   Ripley Fraise, MD 11/11/20 [phone]

## 2020-11-11 NOTE — ED Notes (Signed)
Pt ambulated per EDP, gait steady and pt reports she "feels much better than before"; EDP aware

## 2020-11-11 NOTE — ED Notes (Signed)
ED Provider at bedside. 

## 2020-11-11 NOTE — Telephone Encounter (Signed)
Ambulatory referral to cardiology placed. ?

## 2020-11-12 ENCOUNTER — Other Ambulatory Visit: Payer: Self-pay

## 2020-11-12 ENCOUNTER — Encounter: Payer: Self-pay | Admitting: Cardiology

## 2020-11-12 ENCOUNTER — Ambulatory Visit (INDEPENDENT_AMBULATORY_CARE_PROVIDER_SITE_OTHER): Payer: Medicare PPO | Admitting: Cardiology

## 2020-11-12 VITALS — BP 140/80 | HR 82 | Ht 67.0 in | Wt 226.0 lb

## 2020-11-12 DIAGNOSIS — R5383 Other fatigue: Secondary | ICD-10-CM | POA: Insufficient documentation

## 2020-11-12 DIAGNOSIS — I10 Essential (primary) hypertension: Secondary | ICD-10-CM | POA: Diagnosis not present

## 2020-11-12 DIAGNOSIS — E782 Mixed hyperlipidemia: Secondary | ICD-10-CM | POA: Insufficient documentation

## 2020-11-12 DIAGNOSIS — E669 Obesity, unspecified: Secondary | ICD-10-CM

## 2020-11-12 DIAGNOSIS — R0789 Other chest pain: Secondary | ICD-10-CM | POA: Insufficient documentation

## 2020-11-12 DIAGNOSIS — I2510 Atherosclerotic heart disease of native coronary artery without angina pectoris: Secondary | ICD-10-CM

## 2020-11-12 HISTORY — DX: Atherosclerotic heart disease of native coronary artery without angina pectoris: I25.10

## 2020-11-12 MED ORDER — HYDROCHLOROTHIAZIDE 12.5 MG PO CAPS: 12.5000 mg | ORAL_CAPSULE | Freq: Every day | ORAL | 3 refills | Status: DC

## 2020-11-12 NOTE — Patient Instructions (Addendum)
Medication Instructions:  Your physician has recommended you make the following change in your medication:  START: Hydrochlorothiazide 12.5 mg daily  *If you need a refill on your cardiac medications before your next appointment, please call your pharmacy*   Lab Work: Your physician recommends that you return for lab work: 3-7 days before CT BMET, Mag, Lipids  If you have labs (blood work) drawn today and your tests are completely normal, you will receive your results only by: Marland Kitchen MyChart Message (if you have MyChart) OR . A paper copy in the mail If you have any lab test that is abnormal or we need to change your treatment, we will call you to review the results.   Testing/Procedures: Your physician has requested that you have an echocardiogram. Echocardiography is a painless test that uses sound waves to create images of your heart. It provides your doctor with information about the size and shape of your heart and how well your heart's chambers and valves are working. This procedure takes approximately one hour. There are no restrictions for this procedure.  Your physician has requested that you have a renal artery duplex. During this test, an ultrasound is used to evaluate blood flow to the kidneys. Allow one hour for this exam. Do not eat after midnight the day before and avoid carbonated beverages. Take your medications as you usually do.  Your cardiac CT will be scheduled at:  [facility] [address] [place], [place] [address] [phone]   If scheduled at [facility], please arrive at the [facility] main entrance (entrance A) of [facility] 30 minutes prior to test start time. Proceed to the [facility] Radiology Department (first floor) to check-in and test prep.  Please follow these instructions carefully (unless otherwise directed):  On the Night Before the Test: . Be sure to Drink plenty of water. . Do not consume any  caffeinated/decaffeinated beverages or chocolate 12 hours prior to your test. . Do not take any antihistamines 12 hours prior to your test.  On the Day of the Test: . Drink plenty of water until 1 hour prior to the test. . Do not eat any food 4 hours prior to the test. . You may take your regular medications prior to the test.  . Take metoprolol (Lopressor) two hours prior to test. . FEMALES- please wear underwire-free bra if available       After the Test: . Drink plenty of water. . After receiving IV contrast, you may experience a mild flushed feeling. This is normal. . On occasion, you may experience a mild rash up to 24 hours after the test. This is not dangerous. If this occurs, you can take Benadryl 25 mg and increase your fluid intake. . If you experience trouble breathing, this can be serious. If it is severe call 911 IMMEDIATELY. If it is mild, please call our office.   Once we have confirmed authorization from your insurance company, we will call you to set up a date and time for your test. Based on how quickly your insurance processes prior authorizations requests, please allow up to 4 weeks to be contacted for scheduling your Cardiac CT appointment. Be advised that routine Cardiac CT appointments could be scheduled as many as 8 weeks after your provider has ordered it.  For non-scheduling related questions, please contact the cardiac imaging nurse navigator should you have any questions/concerns: Marchia Bond, Cardiac Imaging Nurse Navigator Gordy Clement, Cardiac Imaging Nurse Navigator [place]  [facility] Direct Office Dial: [phone]   For scheduling needs, including cancellations and rescheduling, please call [place], [phone].    Follow-Up: At [facility], you and your health needs are our priority.  As part of our continuing mission to provide you with exceptional heart care, we have created designated Provider Care Teams.  These Care  Teams include your primary Cardiologist (physician) and Advanced Practice Providers (APPs -  Physician Assistants and Nurse Practitioners) who all work together to provide you with the care you need, when you need it.  We recommend signing up for the patient portal called "MyChart".  Sign up information is provided on this After Visit Summary.  MyChart is used to connect with patients for Virtual Visits (Telemedicine).  Patients are able to view lab/test results, encounter notes, upcoming appointments, etc.  Non-urgent messages can be sent to your provider as well.   To learn more about what you can do with MyChart, go to [email or web address].    Your next appointment:   4 week(s)  The format for your next appointment:   In Person  Provider:   Berniece Salines, DO   Other Instructions  Echocardiogram An echocardiogram is a test that uses sound waves (ultrasound) to produce images of the heart. Images from an echocardiogram can provide important information about:  Heart size and shape.  The size and thickness and movement of your heart's walls.  Heart muscle function and strength.  Heart valve function or if you have stenosis. Stenosis is when the heart valves are too narrow.  If blood is flowing backward through the heart valves (regurgitation).  A tumor or infectious growth around the heart valves.  Areas of heart muscle that are not working well because of poor blood flow or injury from a heart attack.  Aneurysm detection. An aneurysm is a weak or damaged part of an artery wall. The wall bulges out from the normal force of blood pumping through the body. Tell a health care provider about:  Any allergies you have.  All medicines you are taking, including vitamins, herbs, eye drops, creams, and over-the-counter medicines.  Any blood disorders you have.  Any surgeries you have had.  Any medical conditions you have.  Whether you are pregnant or may be pregnant. What are  the risks? Generally, this is a safe test. However, problems may occur, including an allergic reaction to dye (contrast) that may be used during the test. What happens before the test? No specific preparation is needed. You may eat and drink normally. What happens during the test?  You will take off your clothes from the waist up and put on a hospital gown.  Electrodes or electrocardiogram (ECG)patches may be placed on your chest. The electrodes or patches are then connected to a device that monitors your heart rate and rhythm.  You will lie down on a table for an ultrasound exam. A gel will be applied to your chest to help sound waves pass through your skin.  A handheld device, called a transducer, will be pressed against your chest and moved over your heart. The transducer produces sound waves that travel to your heart and bounce back (or "echo" back) to the transducer. These sound waves will be captured in real-time and changed into images of your heart that can be viewed on a video monitor. The images will be recorded on a computer and reviewed by your health care provider.  You may be asked to change positions or hold  your breath for a short time. This makes it easier to get different views or better views of your heart.  In some cases, you may receive contrast through an IV in one of your veins. This can improve the quality of the pictures from your heart. The procedure may vary among health care providers and hospitals.   What can I expect after the test? You may return to your normal, everyday life, including diet, activities, and medicines, unless your health care provider tells you not to do that. Follow these instructions at home:  It is up to you to get the results of your test. Ask your health care provider, or the department that is doing the test, when your results will be ready.  Keep all follow-up visits. This is important. Summary  An echocardiogram is a test that uses  sound waves (ultrasound) to produce images of the heart.  Images from an echocardiogram can provide important information about the size and shape of your heart, heart muscle function, heart valve function, and other possible heart problems.  You do not need to do anything to prepare before this test. You may eat and drink normally.  After the echocardiogram is completed, you may return to your normal, everyday life, unless your health care provider tells you not to do that. This information is not intended to replace advice given to you by your health care provider. Make sure you discuss any questions you have with your health care provider. Document Revised: 05/05/2020 Document Reviewed: 05/05/2020 Elsevier Patient Education  2021 [employer].
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
## 2020-11-17 ENCOUNTER — Telehealth: Payer: Self-pay | Admitting: Cardiology

## 2020-11-17 ENCOUNTER — Telehealth (HOSPITAL_COMMUNITY): Payer: Self-pay | Admitting: *Deleted

## 2020-11-17 LAB — BASIC METABOLIC PANEL
BUN/Creatinine Ratio: 15 (ref 12–28)
BUN: 14 mg/dL (ref 8–27)
CO2: 25 mmol/L (ref 20–29)
Calcium: 9.3 mg/dL (ref 8.7–10.3)
Chloride: 99 mmol/L (ref 96–106)
Creatinine, Ser: 0.94 mg/dL (ref 0.57–1.00)
GFR calc Af Amer: 72 mL/min/{1.73_m2} (ref >59)
GFR calc non Af Amer: 63 mL/min/{1.73_m2} (ref >59)
Glucose: 86 mg/dL (ref 65–99)
Potassium: 4.2 mmol/L (ref 3.5–5.2)
Sodium: 138 mmol/L (ref 134–144)

## 2020-11-17 LAB — LIPID PANEL
Chol/HDL Ratio: 3.8 ratio (ref 0.0–4.4)
Cholesterol, Total: 226 mg/dL — ABNORMAL HIGH (ref 100–199)
HDL: 60 mg/dL (ref >39)
LDL Chol Calc (NIH): 150 mg/dL — ABNORMAL HIGH (ref 0–99)
Triglycerides: 92 mg/dL (ref 0–149)
VLDL Cholesterol Cal: 16 mg/dL (ref 5–40)

## 2020-11-17 LAB — MAGNESIUM: Magnesium: 2 mg/dL (ref 1.6–2.3)

## 2020-11-17 NOTE — Telephone Encounter (Signed)
Patient is returning call regarding lab results

## 2020-11-17 NOTE — Telephone Encounter (Signed)
Reaching out to patient to offer assistance regarding upcoming cardiac imaging study; pt verbalizes understanding of appt date/time, parking situation and where to check in, pre-test NPO status, and verified current allergies; name and call back number provided for further questions should they arise  Merle Prescott RN Navigator Cardiac [facility] [phone] office [phone] cell  

## 2020-11-18 ENCOUNTER — Other Ambulatory Visit: Payer: Self-pay

## 2020-11-18 MED ORDER — ATORVASTATIN CALCIUM 20 MG PO TABS: 20.0000 mg | ORAL_TABLET | Freq: Every day | ORAL | 3 refills | Status: DC

## 2020-11-18 NOTE — Progress Notes (Signed)
Prescription sent to pharmacy.

## 2020-11-19 ENCOUNTER — Ambulatory Visit (HOSPITAL_COMMUNITY)
Admission: RE | Admit: 2020-11-19 | Discharge: 2020-11-19 | Disposition: A | Discharge status: Home / Self Care | Payer: Medicare PPO | Source: Ambulatory Visit | Attending: Cardiology | Admitting: Cardiology

## 2020-11-19 ENCOUNTER — Other Ambulatory Visit: Payer: Self-pay

## 2020-11-19 DIAGNOSIS — Z006 Encounter for examination for normal comparison and control in clinical research program: Secondary | ICD-10-CM

## 2020-11-19 DIAGNOSIS — K449 Diaphragmatic hernia without obstruction or gangrene: Secondary | ICD-10-CM | POA: Diagnosis not present

## 2020-11-19 DIAGNOSIS — R931 Abnormal findings on diagnostic imaging of heart and coronary circulation: Secondary | ICD-10-CM | POA: Diagnosis not present

## 2020-11-19 DIAGNOSIS — I700 Atherosclerosis of aorta: Secondary | ICD-10-CM | POA: Diagnosis not present

## 2020-11-19 DIAGNOSIS — R0789 Other chest pain: Secondary | ICD-10-CM | POA: Diagnosis present

## 2020-11-19 DIAGNOSIS — I2510 Atherosclerotic heart disease of native coronary artery without angina pectoris: Secondary | ICD-10-CM | POA: Diagnosis not present

## 2020-11-19 MED ORDER — NITROGLYCERIN 0.4 MG SL SUBL
SUBLINGUAL_TABLET | SUBLINGUAL | Status: AC
  Filled 2020-11-19: qty 2

## 2020-11-19 MED ORDER — IOHEXOL 350 MG/ML SOLN
80.0000 mL | Freq: Once | INTRAVENOUS | Status: AC | PRN
  Administered 2020-11-19: 80 mL via INTRAVENOUS

## 2020-11-19 MED ORDER — NITROGLYCERIN 0.4 MG SL SUBL
0.8000 mg | SUBLINGUAL_TABLET | Freq: Once | SUBLINGUAL | Status: AC
  Administered 2020-11-19: 0.8 mg via SUBLINGUAL

## 2020-11-19 NOTE — Research (Signed)
IDENTIFY Informed Consent                  Subject Name: Pamela Sims   Subject met inclusion and exclusion criteria.  The informed consent form, study requirements and expectations were reviewed with the subject and questions and concerns were addressed prior to the signing of the consent form.  The subject verbalized understanding of the trial requirements.  The subject agreed to participate in the IDENTIFY trial and signed the informed consent at 11:55AM on 11/19/20.  The informed consent was obtained prior to performance of any protocol-specific procedures for the subject.  A copy of the signed informed consent was given to the subject and a copy was placed in the subject's medical record.   Meade Maw, [employer]

## 2020-11-27 ENCOUNTER — Telehealth: Payer: Self-pay

## 2020-11-27 NOTE — Telephone Encounter (Signed)
Spoke with patient about her CT results and starting to take Aspirin 81mg  and continuing on Lipitor. Patient verbalizes understanding. No questions or concerns at this time.  Patient states she woke up today at 4am this morning and her blood pressure was 199/104 after taking her medications. Yesterday before her Ramipril her blood pressure was 189/98.  Patient states she has noticed her breathing more since becoming a patient here. She is unsure if she becoming short of breath or if it is from preventing a cough (she has excerises from another doctor to work on her breathing.) Will refer to Dr. Harriet Masson for advice.

## 2020-11-30 ENCOUNTER — Telehealth: Payer: Self-pay | Admitting: Cardiology

## 2020-11-30 NOTE — Telephone Encounter (Signed)
Patient has Bronchitis and got a steroid shot today (3.07.22). She wanted to know if she should keep her appointments for her Echo and Renal Ultrasound Thursday. She was not sure if the steroid shot would affect her test results or not. Please advise

## 2020-11-30 NOTE — Telephone Encounter (Signed)
Spoke to the patient just now and let her know that she should be fine to come in for these ultrasounds. She verbalizes understanding and thanks me for the call back.

## 2020-12-03 ENCOUNTER — Other Ambulatory Visit: Payer: Self-pay

## 2020-12-03 ENCOUNTER — Ambulatory Visit (HOSPITAL_COMMUNITY)
Admission: RE | Admit: 2020-12-03 | Discharge: 2020-12-03 | Disposition: A | Discharge status: Home / Self Care | Payer: Medicare PPO | Source: Ambulatory Visit | Attending: Cardiology | Admitting: Cardiology

## 2020-12-03 ENCOUNTER — Ambulatory Visit (HOSPITAL_BASED_OUTPATIENT_CLINIC_OR_DEPARTMENT_OTHER)
Admission: RE | Admit: 2020-12-03 | Discharge: 2020-12-03 | Disposition: A | Discharge status: Home / Self Care | Payer: Medicare PPO | Source: Ambulatory Visit | Attending: Cardiology | Admitting: Cardiology

## 2020-12-03 DIAGNOSIS — I10 Essential (primary) hypertension: Secondary | ICD-10-CM

## 2020-12-03 DIAGNOSIS — R5383 Other fatigue: Secondary | ICD-10-CM | POA: Diagnosis present

## 2020-12-03 DIAGNOSIS — I340 Nonrheumatic mitral (valve) insufficiency: Secondary | ICD-10-CM

## 2020-12-03 DIAGNOSIS — R0602 Shortness of breath: Secondary | ICD-10-CM | POA: Diagnosis not present

## 2020-12-03 LAB — ECHOCARDIOGRAM COMPLETE
Area-P 1/2: 2.16 cm2
S' Lateral: 3.6 cm

## 2020-12-03 NOTE — Progress Notes (Signed)
  Echocardiogram 2D Echocardiogram has been performed.  Pamela Sims 12/03/2020, 8:54 AM

## 2020-12-07 ENCOUNTER — Telehealth: Payer: Self-pay | Admitting: Cardiology

## 2020-12-07 NOTE — Telephone Encounter (Signed)
Patient returning call for her echo and renal results

## 2020-12-07 NOTE — Telephone Encounter (Signed)
Spoke with patient.

## 2020-12-11 ENCOUNTER — Other Ambulatory Visit: Payer: Self-pay

## 2020-12-11 DIAGNOSIS — A159 Respiratory tuberculosis unspecified: Secondary | ICD-10-CM | POA: Insufficient documentation

## 2020-12-11 DIAGNOSIS — K5790 Diverticulosis of intestine, part unspecified, without perforation or abscess without bleeding: Secondary | ICD-10-CM | POA: Insufficient documentation

## 2020-12-11 DIAGNOSIS — Z8489 Family history of other specified conditions: Secondary | ICD-10-CM | POA: Insufficient documentation

## 2020-12-11 DIAGNOSIS — R1084 Generalized abdominal pain: Secondary | ICD-10-CM | POA: Insufficient documentation

## 2020-12-11 DIAGNOSIS — F419 Anxiety disorder, unspecified: Secondary | ICD-10-CM | POA: Insufficient documentation

## 2020-12-11 DIAGNOSIS — M1990 Unspecified osteoarthritis, unspecified site: Secondary | ICD-10-CM | POA: Insufficient documentation

## 2020-12-11 DIAGNOSIS — K449 Diaphragmatic hernia without obstruction or gangrene: Secondary | ICD-10-CM | POA: Insufficient documentation

## 2020-12-11 DIAGNOSIS — R110 Nausea: Secondary | ICD-10-CM | POA: Insufficient documentation

## 2020-12-11 DIAGNOSIS — T8859XA Other complications of anesthesia, initial encounter: Secondary | ICD-10-CM | POA: Insufficient documentation

## 2020-12-11 DIAGNOSIS — Z1211 Encounter for screening for malignant neoplasm of colon: Secondary | ICD-10-CM

## 2020-12-11 DIAGNOSIS — J45909 Unspecified asthma, uncomplicated: Secondary | ICD-10-CM | POA: Insufficient documentation

## 2020-12-11 DIAGNOSIS — F32A Depression, unspecified: Secondary | ICD-10-CM | POA: Insufficient documentation

## 2020-12-11 DIAGNOSIS — F909 Attention-deficit hyperactivity disorder, unspecified type: Secondary | ICD-10-CM | POA: Insufficient documentation

## 2020-12-11 DIAGNOSIS — R0602 Shortness of breath: Secondary | ICD-10-CM | POA: Insufficient documentation

## 2020-12-11 HISTORY — DX: Encounter for screening for malignant neoplasm of colon: Z12.11

## 2020-12-11 HISTORY — DX: Diaphragmatic hernia without obstruction or gangrene: K44.9

## 2020-12-11 HISTORY — DX: Generalized abdominal pain: R10.84

## 2020-12-11 HISTORY — DX: Nausea: R11.0

## 2020-12-14 ENCOUNTER — Ambulatory Visit (INDEPENDENT_AMBULATORY_CARE_PROVIDER_SITE_OTHER): Payer: Medicare PPO | Admitting: Cardiology

## 2020-12-14 ENCOUNTER — Other Ambulatory Visit: Payer: Self-pay

## 2020-12-14 ENCOUNTER — Encounter: Payer: Self-pay | Admitting: Cardiology

## 2020-12-14 VITALS — BP 148/78 | HR 78 | Ht 66.0 in | Wt 217.0 lb

## 2020-12-14 DIAGNOSIS — N951 Menopausal and female climacteric states: Secondary | ICD-10-CM | POA: Diagnosis not present

## 2020-12-14 DIAGNOSIS — I10 Essential (primary) hypertension: Secondary | ICD-10-CM

## 2020-12-14 DIAGNOSIS — E669 Obesity, unspecified: Secondary | ICD-10-CM

## 2020-12-14 DIAGNOSIS — I25110 Atherosclerotic heart disease of native coronary artery with unstable angina pectoris: Secondary | ICD-10-CM

## 2020-12-14 DIAGNOSIS — E782 Mixed hyperlipidemia: Secondary | ICD-10-CM | POA: Diagnosis not present

## 2020-12-14 MED ORDER — ISOSORBIDE MONONITRATE ER 30 MG PO TB24: 30.0000 mg | ORAL_TABLET | Freq: Every day | ORAL | 3 refills | Status: DC

## 2020-12-14 MED ORDER — HYDROCHLOROTHIAZIDE 25 MG PO TABS: 25.0000 mg | ORAL_TABLET | Freq: Every day | ORAL | 3 refills | Status: DC

## 2020-12-14 NOTE — Patient Instructions (Addendum)
Medication Instructions:  Your physician has recommended you make the following change in your medication:   Increase your hydrochlorothiazide to 25 mg daily. Start Imdur 30 mg daily.  *If you need a refill on your cardiac medications before your next appointment, please call your pharmacy*   Lab Work: Your physician recommends that you have labs done in the office today. Your test included  basic metabolic panel and magnesium.  If you have labs (blood work) drawn today and your tests are completely normal, you will receive your results only by: Marland Kitchen MyChart Message (if you have MyChart) OR . A paper copy in the mail If you have any lab test that is abnormal or we need to change your treatment, we will call you to review the results.   Testing/Procedures: None ordered   Follow-Up: At [facility], you and your health needs are our priority.  As part of our continuing mission to provide you with exceptional heart care, we have created designated Provider Care Teams.  These Care Teams include your primary Cardiologist (physician) and Advanced Practice Providers (APPs -  Physician Assistants and Nurse Practitioners) who all work together to provide you with the care you need, when you need it.  We recommend signing up for the patient portal called "MyChart".  Sign up information is provided on this After Visit Summary.  MyChart is used to connect with patients for Virtual Visits (Telemedicine).  Patients are able to view lab/test results, encounter notes, upcoming appointments, etc.  Non-urgent messages can be sent to your provider as well.   To learn more about what you can do with MyChart, go to [email or web address].    Your next appointment:   12 week(s)  The format for your next appointment:   In Person  Provider:   Berniece Salines, DO   Other Instructions Isosorbide Mononitrate extended-release tablets What is this medicine? ISOSORBIDE MONONITRATE (eye soe SOR bide mon oh NYE  trate) is a vasodilator. It relaxes blood vessels, increasing the blood and oxygen supply to your heart. This medicine is used to prevent chest pain caused by angina. It will not help to stop an episode of chest pain. This medicine may be used for other purposes; ask your health care provider or pharmacist if you have questions. COMMON BRAND NAME(S): Imdur, Isotrate ER What should I tell my health care provider before I take this medicine? They need to know if you have any of these conditions:  previous heart attack or heart failure  an unusual or allergic reaction to isosorbide mononitrate, nitrates, other medicines, foods, dyes, or preservatives  pregnant or trying to get pregnant  breast-feeding How should I use this medicine? Take this medicine by mouth with a glass of water. Follow the directions on the prescription label. Do not crush or chew. Take your medicine at regular intervals. Do not take your medicine more often than directed. Do not stop taking this medicine except on the advice of your doctor or health care professional. Talk to your pediatrician regarding the use of this medicine in children. Special care may be needed. Overdosage: If you think you have taken too much of this medicine contact a poison control center or emergency room at once. NOTE: This medicine is only for you. Do not share this medicine with others. What if I miss a dose? If you miss a dose, take it as soon as you can. If it is almost time for your next dose, take only that dose. Do not take  double or extra doses. What may interact with this medicine? Do not take this medicine with any of the following medications:  medicines used to treat erectile dysfunction (ED) like avanafil, sildenafil, tadalafil, and vardenafil  riociguat This medicine may also interact with the following medications:  medicines for high blood pressure  other medicines for angina or heart failure This list may not describe all  possible interactions. Give your health care provider a list of all the medicines, herbs, non-prescription drugs, or dietary supplements you use. Also tell them if you smoke, drink alcohol, or use illegal drugs. Some items may interact with your medicine. What should I watch for while using this medicine? Check your heart rate and blood pressure regularly while you are taking this medicine. Ask your doctor or health care professional what your heart rate and blood pressure should be and when you should contact him or her. Tell your doctor or health care professional if you feel your medicine is no longer working. You may get dizzy. Do not drive, use machinery, or do anything that needs mental alertness until you know how this medicine affects you. To reduce the risk of dizzy or fainting spells, do not sit or stand up quickly, especially if you are an older patient. Alcohol can make you more dizzy, and increase flushing and rapid heartbeats. Avoid alcoholic drinks. Do not treat yourself for coughs, colds, or pain while you are taking this medicine without asking your doctor or health care professional for advice. Some ingredients may increase your blood pressure. What side effects may I notice from receiving this medicine? Side effects that you should report to your doctor or health care professional as soon as possible:  bluish discoloration of lips, fingernails, or palms of hands  irregular heartbeat, palpitations  low blood pressure  nausea, vomiting  persistent headache  unusually weak or tired Side effects that usually do not require medical attention (report to your doctor or health care professional if they continue or are bothersome):  flushing of the face or neck  rash This list may not describe all possible side effects. Call your doctor for medical advice about side effects. You may report side effects to FDA at 1-800-FDA-1088. Where should I keep my medicine? Keep out of the  reach of children. Store between 15 and 30 degrees C (59 and 86 degrees F). Keep container tightly closed. Throw away any unused medicine after the expiration date. NOTE: This sheet is a summary. It may not cover all possible information. If you have questions about this medicine, talk to your doctor, pharmacist, or health care provider.  2021 [employer] Standard (2013-07-12 14:48:19)

## 2020-12-14 NOTE — Progress Notes (Signed)
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 more important than the amount. Some sources of carbohydrates, such as vegetables, fruits, whole grains, and beans-are healthier than others.   Finally, stay active  Signed, Berniece Salines, DO  12/14/2020 11:56 AM    [place]

## 2020-12-15 LAB — BASIC METABOLIC PANEL
BUN/Creatinine Ratio: 15 (ref 12–28)
BUN: 14 mg/dL (ref 8–27)
CO2: 23 mmol/L (ref 20–29)
Calcium: 9.3 mg/dL (ref 8.7–10.3)
Chloride: 101 mmol/L (ref 96–106)
Creatinine, Ser: 0.93 mg/dL (ref 0.57–1.00)
Glucose: 102 mg/dL — ABNORMAL HIGH (ref 65–99)
Potassium: 3.8 mmol/L (ref 3.5–5.2)
Sodium: 142 mmol/L (ref 134–144)
eGFR: 67 mL/min/{1.73_m2} (ref >59)

## 2020-12-15 LAB — MAGNESIUM: Magnesium: 2.1 mg/dL (ref 1.6–2.3)

## 2020-12-16 ENCOUNTER — Other Ambulatory Visit (HOSPITAL_BASED_OUTPATIENT_CLINIC_OR_DEPARTMENT_OTHER): Payer: Medicare PPO

## 2020-12-21 ENCOUNTER — Telehealth: Payer: Self-pay | Admitting: Cardiology

## 2020-12-21 NOTE — Telephone Encounter (Signed)
Spoke with patient about her symptoms. She states her symptoms started Friday night. Told patient to stop taking her Imdur. Patient verbalizes understanding. No further questions or concerns at this time.

## 2020-12-21 NOTE — Telephone Encounter (Signed)
     Pt c/o medication issue:  1. Name of Medication:   isosorbide mononitrate (IMDUR) 30 MG 24 hr tablet    2. How are you currently taking this medication (dosage and times per day)? Take 1 tablet (30 mg total) by mouth daily.  3. Are you having a reaction (difficulty breathing--STAT)?   4. What is your medication issue? Pt said since Friday night after taking her isosorbide and fluid pill, she wok up in the middle of the night feeling she needs to go to the bathroom, she did went but couldn't urinate. She said that happened all night like 6-8 times, the next day she tried drinking a lot of water and able to urinate but Saturday afternoon it did it again. She said the only thing that's swollen is her uvula. She also, added Saturday morning she took erythromycin for her bronchitis. But she felt like its the isosorbide causing her not able to urinate.

## 2020-12-31 ENCOUNTER — Telehealth: Payer: Self-pay | Admitting: Cardiology

## 2020-12-31 ENCOUNTER — Ambulatory Visit: Payer: Medicare PPO | Admitting: Obstetrics and Gynecology

## 2020-12-31 ENCOUNTER — Other Ambulatory Visit: Payer: Self-pay

## 2020-12-31 ENCOUNTER — Encounter: Payer: Self-pay | Admitting: Obstetrics and Gynecology

## 2020-12-31 VITALS — BP 142/70 | HR 81 | Ht 65.5 in | Wt 224.0 lb

## 2020-12-31 DIAGNOSIS — B356 Tinea cruris: Secondary | ICD-10-CM | POA: Diagnosis not present

## 2020-12-31 DIAGNOSIS — R1031 Right lower quadrant pain: Secondary | ICD-10-CM | POA: Diagnosis not present

## 2020-12-31 DIAGNOSIS — L680 Hirsutism: Secondary | ICD-10-CM

## 2020-12-31 DIAGNOSIS — R61 Generalized hyperhidrosis: Secondary | ICD-10-CM

## 2020-12-31 MED ORDER — CLOTRIMAZOLE 1 % EX CREA: 1.0000 "application " | TOPICAL_CREAM | Freq: Two times a day (BID) | CUTANEOUS | 0 refills | Status: DC

## 2020-12-31 NOTE — Patient Instructions (Signed)
Hyperhidrosis Hyperhidrosis is a condition in which the body sweats a lot more than normal (excessively). Sweating is a necessary function for a human body. It is normal to sweat when you are hot, physically active, or anxious. However, hyperhidrosis is sweating to an excessive degree. Although the condition is not a serious one, it can make you feel embarrassed. There are two kinds of hyperhidrosis:  Primary hyperhidrosis. The sweating usually localizes in one part of your body, such as your underarms, or in a few areas, such as your feet, face, underarms, and hands. This is the more common kind of hyperhidrosis.  Secondary hyperhidrosis. This type usually affects your entire body. What are the causes? The cause of this condition depends on the kind of hyperhidrosis that you have.  Primary hyperhidrosis may be caused by sweat glands that are more active than normal.  Secondary hyperhidrosis may be caused by an underlying condition or by taking certain medicines, such as antidepressants or diabetes medicines. Possible conditions that may cause secondary hyperhidrosis include: ? Diabetes. ? Gout. ? Anxiety. ? Obesity. ? Menopause. ? Overactive thyroid (hyperthyroidism). ? Tumors. ? Frostbite. ? Certain types of cancers. ? Alcoholism. ? Injury to your nervous system. ? Stroke. ? Parkinson's disease. What increases the risk? You are more likely to develop primary hyperhidrosis if you have a family history of the condition. What are the signs or symptoms? Symptoms of this condition include:  Feeling like you are sweating constantly, even while you are not being active.  Having skin that peels or gets paler or softer in the areas where you sweat the most.  Being able to see sweat on your skin. Other symptoms depend on the kind of hyperhidrosis that you have.  Symptoms of primary hyperhidrosis may include: ? Sweating in the same location on both sides of your body. ? Sweating only  during the day and not while you are sleeping. ? Sweating in specific areas, such as your underarms, palms, feet, and face.  Symptoms of secondary hyperhidrosis may include: ? Sweating all over your body. ? Sweating even while you sleep. How is this diagnosed? This condition may be diagnosed by:  Medical history.  Physical exam. You may also have other tests, including:  Tests to measure the amount of sweat you produce and to show the areas where you sweat the most. These tests may involve: ? Using color-changing chemicals to show patterns of sweating on the skin. ? Weighing paper that has been applied to the skin. This will show the amount of sweat that your body produces. ? Measuring the amount of water that evaporates from the skin. ? Using infrared technology to show patterns of sweating on the skin.  Tests to check for other conditions that may be causing excess sweating. This may include blood, urine, or imaging tests. How is this treated? Treatment for this condition depends on the kind of hyperhidrosis that you have and the areas of your body that are affected. Your health care provider will also treat any underlying conditions. Treatment may include:  Medicines, such as: ? Antiperspirants. These are medicines that stop sweat. ? Injectable medicines. These may include small injections of botulinum toxin. ? Oral medicines. These are taken by mouth to treat underlying conditions and other symptoms.  A procedure to: ? Temporarily turn off the sweat glands in your hands and feet (iontophoresis). ? Remove your sweat glands. ? Cut or destroy the nerves so that they do not send a signal to the sweat  glands (sympathectomy). Follow these instructions at home: Lifestyle  Limit or avoid foods or beverages that may increase your risk of sweating, such as: ? Spicy food. ? Caffeine. ? Alcohol. ? Foods that contain monosodium glutamate (MSG).  If your feet sweat: ? Wear sandals  when possible. ? Do not wear cotton socks. Wear socks that remove or wick moisture from your feet. ? Wear leather shoes. ? Avoid wearing the same pair of shoes for two days in a row.  Try placing sweat pads under your clothes to prevent underarm sweat from showing.  Keep a journal of your sweat symptoms and when they occur. This may help you identify things that trigger your sweating.   General instructions  Take over-the-counter and prescription medicines only as told by your health care provider.  Use antiperspirants as told by your health care provider.  Consider joining a hyperhidrosis support group.  Keep all follow-up visits as told by your health care provider. This is important. Contact a health care provider if:  You have new symptoms.  Your symptoms get worse. Summary  Hyperhidrosis is a condition in which the body sweats a lot more than normal (excessively).  With primary hyperhidrosis, the sweating usually localizes in one part of your body, such as your underarms, or in a few areas, such as your feet, face, underarms, and hands. It is caused by overactive sweat glands in the affected area.  With secondary hyperhidrosis, the sweating affects your entire body. This is caused by an underlying condition.  Treatment for this condition depends on the kind of hyperhidrosis that you have and the parts of your body that are affected. This information is not intended to replace advice given to you by your health care provider. Make sure you discuss any questions you have with your health care provider. Document Revised: 07/08/2020 Document Reviewed: 07/08/2020 Elsevier Patient Education  2021 [employer].

## 2020-12-31 NOTE — Telephone Encounter (Signed)
New Message:    Pt called and said she asked Dr Harriet Masson to refer her to a Primary Doctor. She would like it to be in [facility], pt says this is better for her, she lives in [place].If there is a Engineer, production in the same building with Dr Harriet Masson, she would love that.k
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
## 2021-01-06 LAB — TESTOS,TOTAL,FREE AND SHBG (FEMALE)
Free Testosterone: 0.6 pg/mL (ref 0.1–6.4)
Sex Hormone Binding: 79 nmol/L — ABNORMAL HIGH (ref 14–73)
Testosterone, Total, LC-MS-MS: 7 ng/dL (ref 2–45)

## 2021-01-14 ENCOUNTER — Telehealth: Payer: Self-pay | Admitting: Cardiology

## 2021-01-14 NOTE — Telephone Encounter (Signed)
Yes please for [facility] [place]

## 2021-01-14 NOTE — Telephone Encounter (Signed)
Patient is requesting a PCP dr in [place]. Please advise

## 2021-01-15 ENCOUNTER — Other Ambulatory Visit: Payer: Self-pay

## 2021-01-15 DIAGNOSIS — Z7689 Persons encountering health services in other specified circumstances: Secondary | ICD-10-CM

## 2021-01-15 NOTE — Telephone Encounter (Signed)
Referral made 

## 2021-02-04 ENCOUNTER — Other Ambulatory Visit: Payer: Medicare PPO

## 2021-02-04 ENCOUNTER — Other Ambulatory Visit: Payer: Medicare PPO | Admitting: Obstetrics and Gynecology

## 2021-03-10 ENCOUNTER — Telehealth: Payer: Self-pay

## 2021-03-10 DIAGNOSIS — Z006 Encounter for examination for normal comparison and control in clinical research program: Secondary | ICD-10-CM

## 2021-03-10 NOTE — Telephone Encounter (Signed)
I called patient for her 90-day Identify Study follow up phone call. Patient is doing well. Pt did state she was still experiencing chest pain at rest and chest pain with exertion. Pt saw her Cardiologist 12/14/2020. Her next scheduled appointment with her Cardiologist is 04/01/2021.  I reminded patient I would call her in February for her 1 year follow-up.

## 2021-04-01 ENCOUNTER — Telehealth: Payer: Self-pay

## 2021-04-01 ENCOUNTER — Other Ambulatory Visit: Payer: Self-pay

## 2021-04-01 ENCOUNTER — Ambulatory Visit: Payer: Medicare PPO | Admitting: Cardiology

## 2021-04-01 ENCOUNTER — Encounter: Payer: Self-pay | Admitting: Cardiology

## 2021-04-01 ENCOUNTER — Other Ambulatory Visit: Payer: Self-pay | Admitting: *Deleted

## 2021-04-01 VITALS — BP 148/78 | HR 70 | Ht 66.0 in | Wt 216.1 lb

## 2021-04-01 DIAGNOSIS — R1031 Right lower quadrant pain: Secondary | ICD-10-CM

## 2021-04-01 DIAGNOSIS — E782 Mixed hyperlipidemia: Secondary | ICD-10-CM

## 2021-04-01 DIAGNOSIS — R5383 Other fatigue: Secondary | ICD-10-CM

## 2021-04-01 DIAGNOSIS — R61 Generalized hyperhidrosis: Secondary | ICD-10-CM

## 2021-04-01 DIAGNOSIS — I2510 Atherosclerotic heart disease of native coronary artery without angina pectoris: Secondary | ICD-10-CM | POA: Diagnosis not present

## 2021-04-01 DIAGNOSIS — E669 Obesity, unspecified: Secondary | ICD-10-CM | POA: Diagnosis not present

## 2021-04-01 DIAGNOSIS — I10 Essential (primary) hypertension: Secondary | ICD-10-CM | POA: Diagnosis not present

## 2021-04-01 MED ORDER — RAMIPRIL 10 MG PO CAPS: 10.0000 mg | ORAL_CAPSULE | Freq: Every day | ORAL | 3 refills | Status: DC
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
## 2021-04-01 NOTE — Patient Instructions (Signed)
Medication Instructions:  Your physician has recommended you make the following change in your medication:  INCREASE: Ramipril (ALTACE) 10 mg once daily  *If you need a refill on your cardiac medications before your next appointment, please call your pharmacy*   Lab Work: Your physician recommends that you return for lab work in:  TODAY: BMET, Mag, CBC, TSH, Lipids  If you have labs (blood work) drawn today and your tests are completely normal, you will receive your results only by: [place] (if you have [place]) OR A paper copy in the mail If you have any lab test that is abnormal or we need to change your treatment, we will call you to review the results.   Testing/Procedures: NOne   Follow-Up: At [facility], you and your health needs are our priority.  As part of our continuing mission to provide you with exceptional heart care, we have created designated Provider Care Teams.  These Care Teams include your primary Cardiologist (physician) and Advanced Practice Providers (APPs -  Physician Assistants and Nurse Practitioners) who all work together to provide you with the care you need, when you need it.  We recommend signing up for the patient portal called "MyChart".  Sign up information is provided on this After Visit Summary.  MyChart is used to connect with patients for Virtual Visits (Telemedicine).  Patients are able to view lab/test results, encounter notes, upcoming appointments, etc.  Non-urgent messages can be sent to your provider as well.   To learn more about what you can do with MyChart, go to [email or web address].    Your next appointment:   12 month(s)  The format for your next appointment:   In Person  Provider:   [address] - Berniece Salines, DO    Other Instructions

## 2021-04-01 NOTE — Telephone Encounter (Signed)
Patient is calling because she said Dr. Harriet Masson told her that Dr. Quincy Simmonds was referring her to endocrinologist and she states she is uaware of that.   12/31/20 visit "PLAN   Patient will return for pelvic ultrasound to evaluate ovaries as a potential cause for her pelvic pain. 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  Written information given. Will check a testosterone level. If her gynecologic evaluation is normal, I recommend she see an endocrinologist to rule out rare causes of hyperhidrosis like carcinoid or pheochromocytoma. Rx for Clotrimazole cream."

## 2021-04-02 LAB — BASIC METABOLIC PANEL
BUN/Creatinine Ratio: 12 (ref 12–28)
BUN: 12 mg/dL (ref 8–27)
CO2: 30 mmol/L — ABNORMAL HIGH (ref 20–29)
Calcium: 9.7 mg/dL (ref 8.7–10.3)
Chloride: 99 mmol/L (ref 96–106)
Creatinine, Ser: 0.98 mg/dL (ref 0.57–1.00)
Glucose: 87 mg/dL (ref 65–99)
Potassium: 4 mmol/L (ref 3.5–5.2)
Sodium: 143 mmol/L (ref 134–144)
eGFR: 63 mL/min/{1.73_m2} (ref >59)

## 2021-04-02 LAB — CBC WITH DIFFERENTIAL/PLATELET
Basophils Absolute: 0.1 10*3/uL (ref 0.0–0.2)
Basos: 1 %
EOS (ABSOLUTE): 0.1 10*3/uL (ref 0.0–0.4)
Eos: 2 %
Hematocrit: 41.6 % (ref 34.0–46.6)
Hemoglobin: 14.2 g/dL (ref 11.1–15.9)
Immature Grans (Abs): 0 10*3/uL (ref 0.0–0.1)
Immature Granulocytes: 0 %
Lymphocytes Absolute: 0.9 10*3/uL (ref 0.7–3.1)
Lymphs: 12 %
MCH: 30 pg (ref 26.6–33.0)
MCHC: 34.1 g/dL (ref 31.5–35.7)
MCV: 88 fL (ref 79–97)
Monocytes Absolute: 0.5 10*3/uL (ref 0.1–0.9)
Monocytes: 7 %
Neutrophils Absolute: 5.7 10*3/uL (ref 1.4–7.0)
Neutrophils: 78 %
Platelets: 165 10*3/uL (ref 150–450)
RBC: 4.73 x10E6/uL (ref 3.77–5.28)
RDW: 13.4 % (ref 11.7–15.4)
WBC: 7.3 10*3/uL (ref 3.4–10.8)

## 2021-04-02 LAB — LIPID PANEL
Chol/HDL Ratio: 2.6 ratio (ref 0.0–4.4)
Cholesterol, Total: 151 mg/dL (ref 100–199)
HDL: 58 mg/dL (ref >39)
LDL Chol Calc (NIH): 80 mg/dL (ref 0–99)
Triglycerides: 65 mg/dL (ref 0–149)
VLDL Cholesterol Cal: 13 mg/dL (ref 5–40)

## 2021-04-02 LAB — MAGNESIUM: Magnesium: 2 mg/dL (ref 1.6–2.3)

## 2021-04-02 LAB — TSH: TSH: 1.68 u[IU]/mL (ref 0.450–4.500)

## 2021-04-04 NOTE — Telephone Encounter (Signed)
Ok for her to have referral to endocrinology, first available, for hyperhidrosis.   Please have her keep the appointment of the pelvic ultrasound.

## 2021-04-05 NOTE — Telephone Encounter (Signed)
Patient aware, referral placed at [facility], they will call to schedule. Number to office given to patient as well to call.

## 2021-04-10 IMAGING — CT CT HEART MORP W/ CTA COR W/ SCORE W/ CA W/CM &/OR W/O CM
4 of 7 series · 8 of 20 positions shown, 9 images · non-contrast
Comparison: None.
COMPARISON: None.

Addendum:
EXAM:
OVER-READ INTERPRETATION  CT CHEST

The following report is an over-read performed by radiologist Dr.
Tae Seok Kisung [[email or web address]] on 11/19/2020. This
over-read does not include interpretation of cardiac or coronary
anatomy or pathology. The coronary calcium score/coronary CTA
interpretation by the cardiologist is attached.
CLINICAL DATA: This is a 69 year old female with chest pain.
Cardiac/Coronary  CT
TECHNIQUE: The patient was scanned on a [employer] Force scanner.

[Series 6: best diast 74 % · axial · 0.35mm/px · z∈[-384,-345]mm · 2 of 298 slices shown]
[im 100/298  vessel]
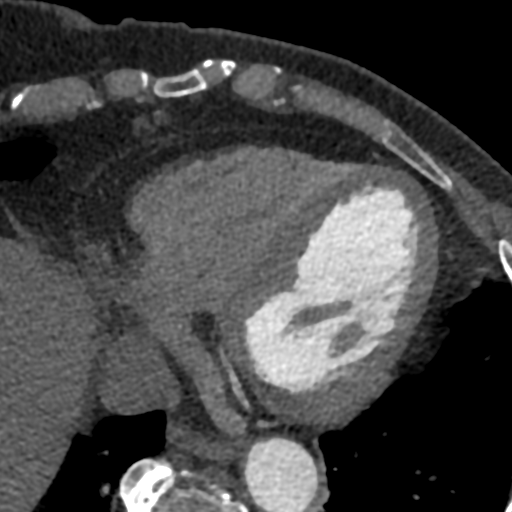
[im 199/298  vessel]
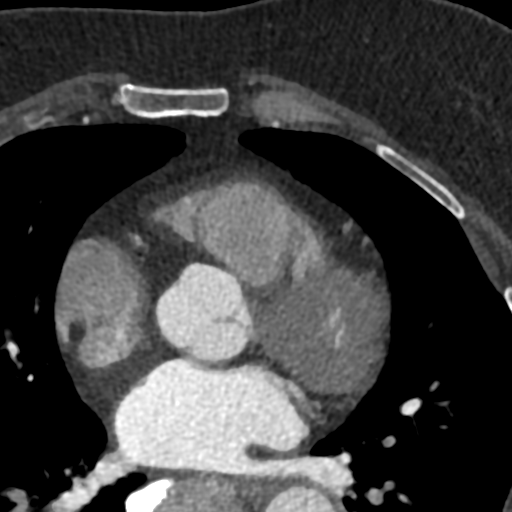

[Series 7: best syst · axial · 0.35mm/px · z∈[-384,-345]mm · 2 of 298 slices shown, 3 images]
[im 100/298  vessel]
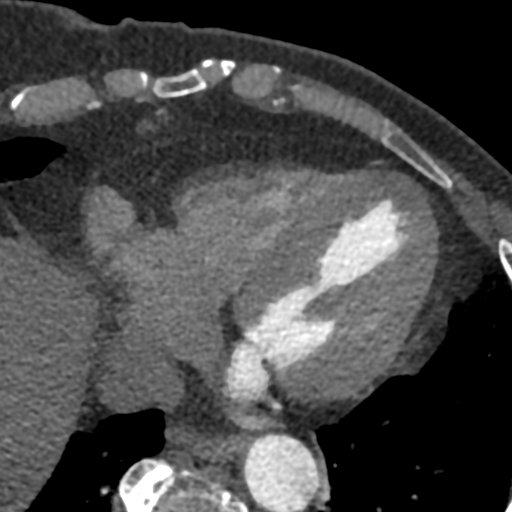
[im 100/298  lung]
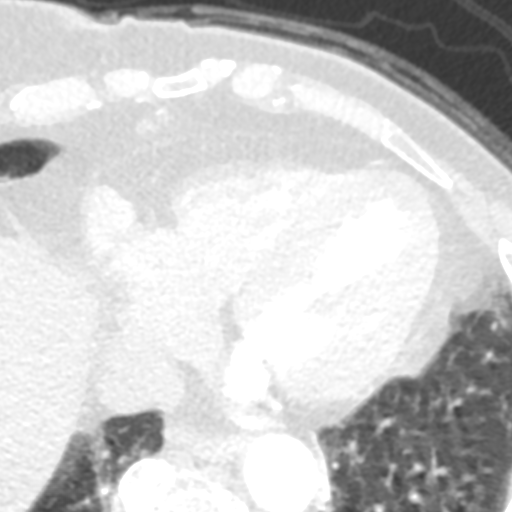
[im 199/298  vessel]
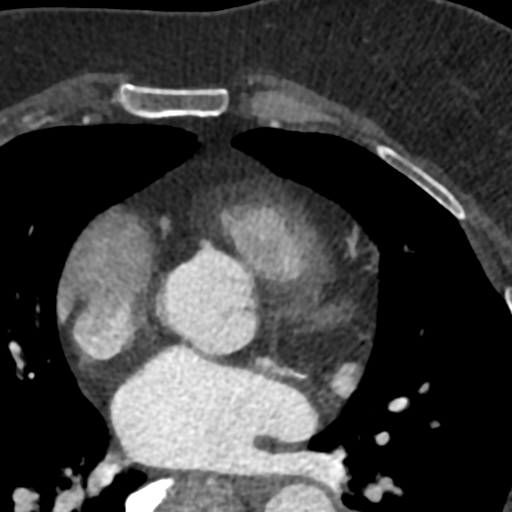

[Series 8: ts diast sharp 74 % · axial · 0.35mm/px · z∈[-384,-345]mm · 2 of 298 slices shown]
[im 100/298  lung]
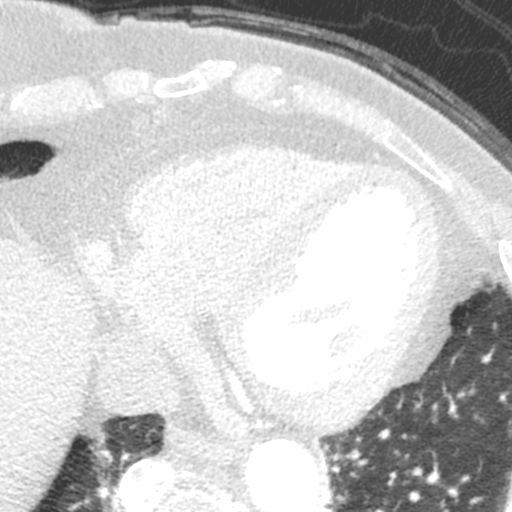
[im 199/298  lung]
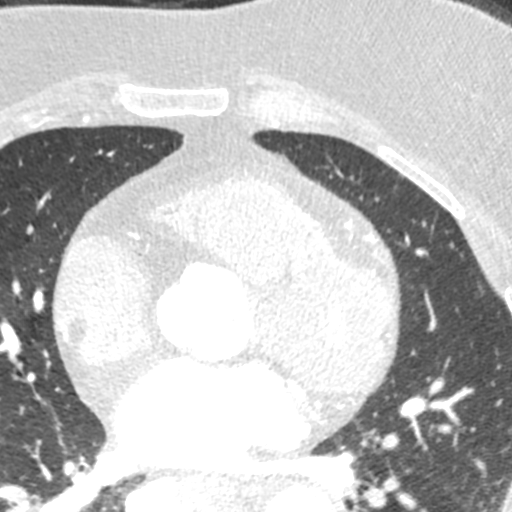

[Series 9: ts syst sharp · axial · 0.35mm/px · z∈[-384,-345]mm · 2 of 298 slices shown]
[im 100/298  lung]
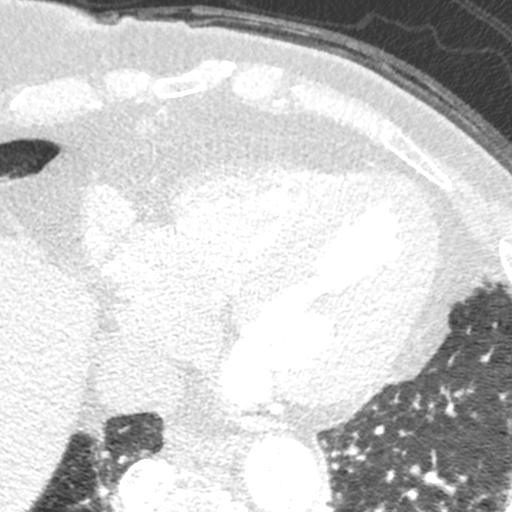
[im 199/298  lung]
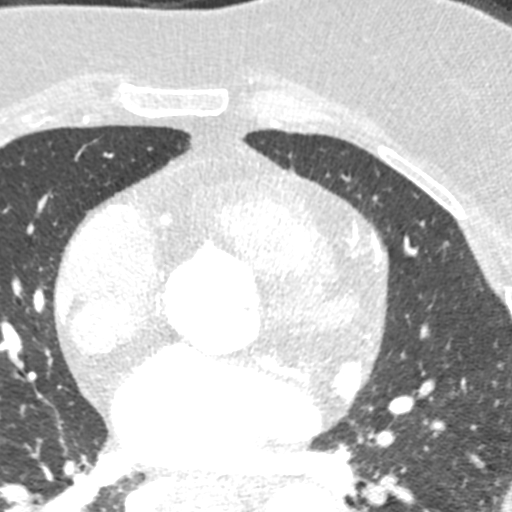

[8 of 20 positions shown; findings below may reference images not displayed]

FINDINGS: Aortic atherosclerosis. Small hiatal hernia. Areas of mild linear
scarring or subsegmental atelectasis are noted in the lung bases.
Within the visualized portions of the thorax there are no suspicious
appearing pulmonary nodules or masses, there is no acute
consolidative airspace disease, no pleural effusions, no
pneumothorax and no lymphadenopathy. Visualized portions of the
upper abdomen are unremarkable. There are no aggressive appearing
lytic or blastic lesions noted in the visualized portions of the
skeleton.
IMPRESSION: 1.  Aortic Atherosclerosis (93IWS-X2Z.Z).
2. Small hiatal hernia.
FINDINGS: A 120 kV prospective scan was triggered in the descending thoracic
aorta at 111 HU's. Axial non-contrast 3 mm slices were carried out
through the heart. The data set was analyzed on a dedicated work
station and scored using the Agatson method. Gantry rotation speed
was 250 msecs and collimation was .6 mm. No beta blockade and 0.8 mg
of sl NTG was given. The 3D data set was reconstructed in 5%
intervals of the 67-82 % of the R-R cycle. Diastolic phases were
analyzed on a dedicated work station using MPR, MIP and VRT modes.
The patient received 80 cc of contrast.

Aorta: Normal size. Mild aortic root calcifications.  No dissection.

Aortic Valve:  Trileaflet.  No calcifications.

Coronary Arteries:  Normal coronary origin.  Right dominance.

Coronary calcium score 299.

RCA is a large dominant artery that gives rise to PDA and PLVB.
There is a very small area mild soft plaque in the proximal- mid
RCA. The mid and distal RCA with no plaques.

Left main is a large artery that gives rise to LAD and LCX arteries.

LAD is a large vessel. The proximal LAD has a long 10.9 mm calcified
lesion, distal to that lesion is a diffuse mixed lesion which can
not be accurately quantified due to some misalignment. The
proximal-mid LAD with a moderate (50-69%) calcified plaque. The
distal LAD with no plaques. D1 is a small caliber vessel with
minimal proximal calcification.

LCX is a non-dominant artery that gives rise to one small OM1
Branch. The proximal LCX with minimal calcifications. The mid LCX
with stair step artifact no appreciable soft plaques. The distal LCX
with no plaques. OM1 with proximal minimal calcifications.

Other findings:

Normal pulmonary vein drainage into the left atrium.

Normal left atrial appendage without a thrombus.

Normal size of the pulmonary artery.
IMPRESSION: 1. Coronary calcium score of 299. This was 89 percentile for age and
sex matched control.

2. Normal coronary origin with right dominance.

3. Moderate CAD. CADRADS 3. This study will be sent for further
analysis with FFRct.

Govanni Bisbal, DO

*** End of Addendum ***
EXAM:
OVER-READ INTERPRETATION  CT CHEST

The following report is an over-read performed by radiologist Dr.
Tae Seok Kisung [REDACTED] on 11/19/2020. This
over-read does not include interpretation of cardiac or coronary
anatomy or pathology. The coronary calcium score/coronary CTA
interpretation by the cardiologist is attached.
FINDINGS: Aortic atherosclerosis. Small hiatal hernia. Areas of mild linear
scarring or subsegmental atelectasis are noted in the lung bases.
Within the visualized portions of the thorax there are no suspicious
appearing pulmonary nodules or masses, there is no acute
consolidative airspace disease, no pleural effusions, no
pneumothorax and no lymphadenopathy. Visualized portions of the
upper abdomen are unremarkable. There are no aggressive appearing
lytic or blastic lesions noted in the visualized portions of the
skeleton.
IMPRESSION: 1.  Aortic Atherosclerosis (93IWS-X2Z.Z).
2. Small hiatal hernia.

## 2021-05-04 ENCOUNTER — Ambulatory Visit: Payer: Medicare PPO | Admitting: Family

## 2021-05-06 ENCOUNTER — Telehealth: Payer: Self-pay | Admitting: Cardiology

## 2021-05-06 NOTE — Telephone Encounter (Signed)
Patient was calling to see if Dr. Harriet Masson can write her a prescription for diltiazem (DILACOR XR) 120 MG 24 hr capsule. Because she is still waiting to see her new PCP and she is completley out of the medication. Patient states she is asking because she said Dr. Harriet Masson knows she takes this medication. Please advise

## 2021-05-06 NOTE — Telephone Encounter (Signed)
That will be fine.  Please check with the patient and find out where she would like this medication prescription sent.

## 2021-05-07 ENCOUNTER — Telehealth: Payer: Self-pay | Admitting: Cardiology

## 2021-05-07 ENCOUNTER — Other Ambulatory Visit: Payer: Self-pay

## 2021-05-07 MED ORDER — DILTIAZEM HCL ER 120 MG PO CP24: 120.0000 mg | ORAL_CAPSULE | Freq: Every day | ORAL | 3 refills | Status: DC

## 2021-05-07 MED ORDER — DILTIAZEM HCL ER 120 MG PO CP24: ORAL_CAPSULE | ORAL | 3 refills | Status: DC

## 2021-05-07 NOTE — Telephone Encounter (Signed)
Follow Up:     He needs the directions for patient's Diltiazem please.[place]

## 2021-05-07 NOTE — Telephone Encounter (Signed)
Prescription sent in with correct instructions.

## 2021-05-07 NOTE — Telephone Encounter (Signed)
Refill sent in per request.  

## 2021-05-07 NOTE — Telephone Encounter (Signed)
  Follow Uo:    Patient is calling to see if Dr Harriet Masson is going to write the prescription for her Diltiazem.         *STAT* If patient is at the pharmacy, call can be transferred to refill team.   1. Which medications need to be refilled? (please list name of each medication and dose if known) new prescription for Diltiazem  2. Which pharmacy/location (including street and city if local pharmacy) is medication to be sent to? [place], [place]  3. Do they need a 30 day or 90 day supply? 90 days and refills

## 2021-05-25 ENCOUNTER — Ambulatory Visit: Payer: Medicare PPO | Admitting: Family

## 2021-06-18 ENCOUNTER — Ambulatory Visit: Payer: Medicare PPO | Admitting: Family

## 2021-06-18 ENCOUNTER — Encounter: Payer: Self-pay | Admitting: Family

## 2021-06-18 ENCOUNTER — Other Ambulatory Visit: Payer: Self-pay

## 2021-06-18 VITALS — BP 110/60 | HR 68 | Temp 98.0°F | Ht 66.0 in | Wt 219.6 lb

## 2021-06-18 DIAGNOSIS — Z23 Encounter for immunization: Secondary | ICD-10-CM | POA: Diagnosis not present

## 2021-06-18 DIAGNOSIS — Z13820 Encounter for screening for osteoporosis: Secondary | ICD-10-CM

## 2021-06-18 DIAGNOSIS — I10 Essential (primary) hypertension: Secondary | ICD-10-CM

## 2021-06-18 DIAGNOSIS — Z1231 Encounter for screening mammogram for malignant neoplasm of breast: Secondary | ICD-10-CM

## 2021-06-18 DIAGNOSIS — R252 Cramp and spasm: Secondary | ICD-10-CM | POA: Diagnosis not present

## 2021-06-18 DIAGNOSIS — R079 Chest pain, unspecified: Secondary | ICD-10-CM | POA: Diagnosis not present

## 2021-06-18 DIAGNOSIS — E2839 Other primary ovarian failure: Secondary | ICD-10-CM

## 2021-06-18 DIAGNOSIS — E782 Mixed hyperlipidemia: Secondary | ICD-10-CM

## 2021-06-18 DIAGNOSIS — Z8719 Personal history of other diseases of the digestive system: Secondary | ICD-10-CM

## 2021-06-18 LAB — MAGNESIUM: Magnesium: 1.9 mg/dL (ref 1.5–2.5)

## 2021-06-18 LAB — BASIC METABOLIC PANEL
BUN: 16 mg/dL (ref 6–23)
CO2: 32 mEq/L (ref 19–32)
Calcium: 9.6 mg/dL (ref 8.4–10.5)
Chloride: 101 mEq/L (ref 96–112)
Creatinine, Ser: 0.92 mg/dL (ref 0.40–1.20)
GFR: 63.79 mL/min (ref >60.00)
Glucose, Bld: 87 mg/dL (ref 70–99)
Potassium: 3.9 mEq/L (ref 3.5–5.1)
Sodium: 142 mEq/L (ref 135–145)
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
## 2021-06-18 NOTE — Addendum Note (Signed)
Addended by: Kittie Plater, Onix Jumper HUA on: 06/18/2021 11:37 AM   Modules accepted: Orders

## 2021-06-24 ENCOUNTER — Encounter: Payer: Self-pay | Admitting: Obstetrics and Gynecology

## 2021-06-24 ENCOUNTER — Ambulatory Visit (INDEPENDENT_AMBULATORY_CARE_PROVIDER_SITE_OTHER): Payer: Medicare PPO

## 2021-06-24 ENCOUNTER — Ambulatory Visit: Payer: Medicare PPO | Admitting: Obstetrics and Gynecology

## 2021-06-24 ENCOUNTER — Ambulatory Visit: Payer: Medicare PPO | Admitting: Endocrinology

## 2021-06-24 ENCOUNTER — Other Ambulatory Visit: Payer: Self-pay

## 2021-06-24 VITALS — BP 142/78 | HR 61 | Ht 66.0 in

## 2021-06-24 DIAGNOSIS — R102 Pelvic and perineal pain: Secondary | ICD-10-CM

## 2021-06-24 DIAGNOSIS — R1031 Right lower quadrant pain: Secondary | ICD-10-CM

## 2021-06-24 NOTE — Patient Instructions (Addendum)
Pamela Sims,   We saw you today for a pelvic ultrasound due to pelvic pain you report that occurs spontaneously and also with voiding and bowel movements.   The ultrasound showed a normal left ovary.  Your right ovary could not be seen.  Your uterus is absent due to hysterectomy.  The bowel wall near the left tube and ovary appeared to be hypoechoic, which was suggestive of possible inflammation.   Please share this with your gastroenterologist.   Please let me know if you need anything further!  [name], MD
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
## 2021-06-25 ENCOUNTER — Encounter: Payer: Self-pay | Admitting: Endocrinology

## 2021-06-25 ENCOUNTER — Ambulatory Visit: Payer: Medicare PPO | Admitting: Endocrinology

## 2021-06-25 DIAGNOSIS — R61 Generalized hyperhidrosis: Secondary | ICD-10-CM
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
## 2021-06-25 NOTE — Patient Instructions (Signed)
Blood tests, and a 24HR urine collection, are requested for you today.  We'll let you know about the results.  I would be happy to see you back here as needed.

## 2021-07-01 ENCOUNTER — Ambulatory Visit
Admission: RE | Admit: 2021-07-01 | Discharge: 2021-07-01 | Disposition: A | Discharge status: Home / Self Care | Payer: Medicare PPO | Source: Ambulatory Visit | Attending: Family | Admitting: Family

## 2021-07-01 ENCOUNTER — Other Ambulatory Visit: Payer: Self-pay

## 2021-07-01 DIAGNOSIS — Z1231 Encounter for screening mammogram for malignant neoplasm of breast: Secondary | ICD-10-CM

## 2021-07-01 DIAGNOSIS — E2839 Other primary ovarian failure: Secondary | ICD-10-CM

## 2021-07-01 DIAGNOSIS — Z13820 Encounter for screening for osteoporosis: Secondary | ICD-10-CM

## 2021-07-02 LAB — METANEPHRINES, PLASMA
Metanephrine, Free: <25 pg/mL (ref <=57)
Normetanephrine, Free: 64 pg/mL (ref <=148)
Total Metanephrines-Plasma: 64 pg/mL (ref <=205)

## 2021-07-02 LAB — CATECHOLAMINES, FRACTIONATED, PLASMA
Dopamine: 11 pg/mL
Epinephrine: <20 pg/mL
Norepinephrine: 414 pg/mL
Total Catecholamines: 425 pg/mL

## 2021-07-05 ENCOUNTER — Other Ambulatory Visit: Payer: Medicare PPO

## 2021-07-05 ENCOUNTER — Other Ambulatory Visit: Payer: Self-pay

## 2021-07-05 DIAGNOSIS — R61 Generalized hyperhidrosis: Secondary | ICD-10-CM

## 2021-07-08 ENCOUNTER — Ambulatory Visit: Payer: Medicare PPO | Admitting: Family

## 2021-07-08 ENCOUNTER — Other Ambulatory Visit: Payer: Self-pay

## 2021-07-08 ENCOUNTER — Ambulatory Visit (HOSPITAL_BASED_OUTPATIENT_CLINIC_OR_DEPARTMENT_OTHER)
Admission: RE | Admit: 2021-07-08 | Discharge: 2021-07-08 | Disposition: A | Discharge status: Home / Self Care | Payer: Medicare PPO | Source: Ambulatory Visit | Attending: Family | Admitting: Family

## 2021-07-08 VITALS — BP 112/64 | HR 58 | Temp 97.8°F | Resp 18 | Ht 65.5 in | Wt 217.2 lb

## 2021-07-08 DIAGNOSIS — R0789 Other chest pain: Secondary | ICD-10-CM | POA: Insufficient documentation

## 2021-07-08 DIAGNOSIS — L989 Disorder of the skin and subcutaneous tissue, unspecified: Secondary | ICD-10-CM | POA: Diagnosis not present

## 2021-07-08 LAB — D-DIMER, QUANTITATIVE: D-Dimer, Quant: 1.17 mcg/mL FEU — ABNORMAL HIGH (ref <0.50)
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
## 2021-07-09 ENCOUNTER — Other Ambulatory Visit: Payer: Self-pay | Admitting: Family

## 2021-07-09 DIAGNOSIS — R079 Chest pain, unspecified: Secondary | ICD-10-CM

## 2021-07-11 LAB — METANEPHRINES, URINE, 24 HOUR
METANEPHRINE: 15 mcg/24 h — ABNORMAL LOW (ref 90–315)
METANEPHRINES, TOTAL: 161 mcg/24 h — ABNORMAL LOW (ref 224–832)
NORMETANEPHRINE: 146 mcg/24 h (ref 122–676)
Total Volume: 2850 mL

## 2021-07-11 LAB — CATECHOLAMINES, FRACTIONATED, URINE, 24 HOUR
Calc Total (E+NE): 49 mcg/24 h (ref 26–121)
Creatinine, Urine mg/day-CATEUR: 1.76 g/(24.h) (ref 0.50–2.15)
Dopamine 24 Hr Urine: 180 mcg/24 h (ref 52–480)
Norepinephrine, 24H, Ur: 49 mcg/24 h (ref 15–100)
Total Volume: 2850 mL

## 2021-07-13 ENCOUNTER — Other Ambulatory Visit: Payer: Self-pay

## 2021-07-13 ENCOUNTER — Ambulatory Visit
Admission: RE | Admit: 2021-07-13 | Discharge: 2021-07-13 | Disposition: A | Discharge status: Home / Self Care | Payer: Medicare PPO | Source: Ambulatory Visit | Attending: Family | Admitting: Family

## 2021-07-13 DIAGNOSIS — R079 Chest pain, unspecified: Secondary | ICD-10-CM

## 2021-07-13 MED ORDER — IOPAMIDOL (ISOVUE-370) INJECTION 76%
75.0000 mL | Freq: Once | INTRAVENOUS | Status: AC | PRN
  Administered 2021-07-13: 75 mL via INTRAVENOUS

## 2021-07-22 ENCOUNTER — Ambulatory Visit: Payer: Medicare PPO

## 2021-11-23 ENCOUNTER — Other Ambulatory Visit: Payer: Self-pay | Admitting: Cardiology

## 2021-12-16 ENCOUNTER — Encounter: Payer: Self-pay | Admitting: Family Medicine

## 2021-12-16 ENCOUNTER — Ambulatory Visit (HOSPITAL_BASED_OUTPATIENT_CLINIC_OR_DEPARTMENT_OTHER)
Admission: RE | Admit: 2021-12-16 | Discharge: 2021-12-16 | Disposition: A | Discharge status: Home / Self Care | Payer: Medicare PPO | Source: Ambulatory Visit | Attending: Family Medicine | Admitting: Family Medicine

## 2021-12-16 ENCOUNTER — Other Ambulatory Visit: Payer: Self-pay

## 2021-12-16 ENCOUNTER — Ambulatory Visit: Payer: Medicare PPO | Admitting: Family Medicine

## 2021-12-16 VITALS — BP 148/72 | HR 78 | Temp 98.7°F | Resp 20 | Ht 65.5 in | Wt 221.0 lb

## 2021-12-16 DIAGNOSIS — J40 Bronchitis, not specified as acute or chronic: Secondary | ICD-10-CM | POA: Diagnosis not present

## 2021-12-16 DIAGNOSIS — R051 Acute cough: Secondary | ICD-10-CM

## 2021-12-16 MED ORDER — AZITHROMYCIN 250 MG PO TABS: ORAL_TABLET | ORAL | 0 refills | Status: DC

## 2021-12-16 MED ORDER — PREDNISONE 20 MG PO TABS: 40.0000 mg | ORAL_TABLET | Freq: Every day | ORAL | 0 refills | Status: DC

## 2021-12-16 NOTE — Patient Instructions (Signed)
Acute Bronchitis, Adult °Acute bronchitis is sudden inflammation of the main airways (bronchi) that come off the windpipe (trachea) in the lungs. The swelling causes the airways to get smaller and make more mucus than normal. This can make it hard to breathe and can cause coughing or noisy breathing (wheezing). °Acute bronchitis may last several weeks. The cough may last longer. Allergies, asthma, and exposure to smoke may make the condition worse. °What are the causes? °This condition can be caused by germs and by substances that irritate the lungs, including: °Cold and flu viruses. The most common cause of this condition is the virus that causes the common cold. °Bacteria. This is less common. °Breathing in substances that irritate the lungs, including: °Smoke from cigarettes and other forms of tobacco. °Dust and pollen. °Fumes from household cleaning products, gases, or burned fuel. °Indoor or outdoor air pollution. °What increases the risk? °The following factors may make you more likely to develop this condition: °A weak body's defense system, also called the immune system. °A condition that affects your lungs and breathing, such as asthma. °What are the signs or symptoms? °Common symptoms of this condition include: °Coughing. This may bring up clear, yellow, or green mucus from your lungs (sputum). °Wheezing. °Runny or stuffy nose. °Having too much mucus in your lungs (chest congestion). °Shortness of breath. °Aches and pains, including sore throat or chest. °How is this diagnosed? °This condition is usually diagnosed based on: °Your symptoms and medical history. °A physical exam. °You may also have other tests, including tests to rule out other conditions, such as pneumonia. These tests include: °A test of lung function. °Test of a mucus sample to look for the presence of bacteria. °Tests to check the oxygen level in your blood. °Blood tests. °Chest X-ray. °How is this treated? °Most cases of acute bronchitis  clear up over time without treatment. Your health care provider may recommend: °Drinking more fluids to help thin your mucus so it is easier to cough up. °Taking inhaled medicine (inhaler) to improve air flow in and out of your lungs. °Using a vaporizer or a humidifier. These are machines that add water to the air to help you breathe better. °Taking a medicine that thins mucus and clears congestion (expectorant). °Taking a medicine that prevents or stops coughing (cough suppressant). °It is notcommon to take an antibiotic medicine for this condition. °Follow these instructions at home: ° °Take over-the-counter and prescription medicines only as told by your health care provider. °Use an inhaler, vaporizer, or humidifier as told by your health care provider. °Take two teaspoons (10 mL) of honey at bedtime to lessen coughing at night. °Drink enough fluid to keep your urine pale yellow. °Do not use any products that contain nicotine or tobacco. These products include cigarettes, chewing tobacco, and vaping devices, such as e-cigarettes. If you need help quitting, ask your health care provider. °Get plenty of rest. °Return to your normal activities as told by your health care provider. Ask your health care provider what activities are safe for you. °Keep all follow-up visits. This is important. °How is this prevented? °To lower your risk of getting this condition again: °Wash your hands often with soap and water for at least 20 seconds. If soap and water are not available, use hand sanitizer. °Avoid contact with people who have cold symptoms. °Try not to touch your mouth, nose, or eyes with your hands. °Avoid breathing in smoke or chemical fumes. Breathing smoke or chemical fumes will make your condition   worse. °Get the flu shot every year. °Contact a health care provider if: °Your symptoms do not improve after 2 weeks. °You have trouble coughing up the mucus. °Your cough keeps you awake at night. °You have a  fever. °Get help right away if you: °Cough up blood. °Feel pain in your chest. °Have severe shortness of breath. °Faint or keep feeling like you are going to faint. °Have a severe headache. °Have a fever or chills that get worse. °These symptoms may represent a serious problem that is an emergency. Do not wait to see if the symptoms will go away. Get medical help right away. Call your local emergency services (911 in the [place] Do not drive yourself to the hospital. °Summary °Acute bronchitis is inflammation of the main airways (bronchi) that come off the windpipe (trachea) in the lungs. The swelling causes the airways to get smaller and make more mucus than normal. °Drinking more fluids can help thin your mucus so it is easier to cough up. °Take over-the-counter and prescription medicines only as told by your health care provider. °Do not use any products that contain nicotine or tobacco. These products include cigarettes, chewing tobacco, and vaping devices, such as e-cigarettes. If you need help quitting, ask your health care provider. °Contact a health care provider if your symptoms do not improve after 2 weeks. °This information is not intended to replace advice given to you by your health care provider. Make sure you discuss any questions you have with your health care provider. °Document Revised: 01/13/2021 Document Reviewed: 01/13/2021 °Elsevier Patient Education © 2022 [employer] ° °

## 2021-12-16 NOTE — Assessment & Plan Note (Signed)
z pak ?con't otc cough meds  ?pred taper  ?cxr  ?rto prn  ?
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
## 2021-12-17 ENCOUNTER — Telehealth: Payer: Self-pay

## 2021-12-17 ENCOUNTER — Other Ambulatory Visit: Payer: Self-pay | Admitting: Family Medicine

## 2021-12-17 DIAGNOSIS — R051 Acute cough: Secondary | ICD-10-CM

## 2021-12-17 MED ORDER — PROMETHAZINE-DM 6.25-15 MG/5ML PO SYRP: 5.0000 mL | ORAL_SOLUTION | Freq: Four times a day (QID) | ORAL | 0 refills | Status: DC | PRN

## 2021-12-17 NOTE — Telephone Encounter (Signed)
Pt made aware

## 2021-12-17 NOTE — Telephone Encounter (Signed)
Pt called and said she would like to try the cough medicine that was discussed at [facility] yesterday. She wold rather be awake with no cough than with a cough. Please send to [facility]. ? ?

## 2022-01-06 ENCOUNTER — Ambulatory Visit (INDEPENDENT_AMBULATORY_CARE_PROVIDER_SITE_OTHER): Payer: Medicare PPO

## 2022-01-06 VITALS — Ht 65.5 in | Wt 215.0 lb

## 2022-01-06 DIAGNOSIS — Z0000 Encounter for general adult medical examination without abnormal findings: Secondary | ICD-10-CM | POA: Diagnosis not present

## 2022-01-06 DIAGNOSIS — Z780 Asymptomatic menopausal state: Secondary | ICD-10-CM | POA: Diagnosis not present

## 2022-01-06 NOTE — Patient Instructions (Signed)
Pamela Sims , ?Thank you for taking time to come for your Medicare Wellness Visit. I appreciate your ongoing commitment to your health goals. Please review the following plan we discussed and let me know if I can assist you in the future.  ? ?Screening recommendations/referrals: ?Colonoscopy: Done 12/23/2019 Repeat in 10 years ? ?Mammogram: Done 07/01/2021 Repeat annually ? ?Bone Density: Order placed today. ? ?Recommended yearly ophthalmology/optometry visit for glaucoma screening and checkup ?Recommended yearly dental visit for hygiene and checkup ? ?Vaccinations: ?Influenza vaccine: Done 06/18/2021 Repeat annually ? ?Pneumococcal vaccine: Done 07/25/2019. ?Tdap vaccine: Due Repeat in 10 years ? ?Shingles vaccine: Discussed.   ?Covid-19:Done 04/21/2021, 06/18/2020, 11/06/2019, 10/16/2019 ? ?Advanced directives: Advance directive discussed with you today. Even though you declined this today, please call our office should you change your mind, and we can give you the proper paperwork for you to fill out. ? ? ?Conditions/risks identified: KEEP UP THE GOOD WORK!! ?Aim for 30 minutes of exercise or brisk walking, 6-8 glasses of water, and 5 servings of fruits and vegetables each day. ? ? ?Next appointment: Follow up in one year for your annual wellness visit 2024. ? ? ?Preventive Care 16 Years and Older, Female ?Preventive care refers to lifestyle choices and visits with your health care provider that can promote health and wellness. ?What does preventive care include? ?A yearly physical exam. This is also called an annual well check. ?Dental exams once or twice a year. ?Routine eye exams. Ask your health care provider how often you should have your eyes checked. ?Personal lifestyle choices, including: ?Daily care of your teeth and gums. ?Regular physical activity. ?Eating a healthy diet. ?Avoiding tobacco and drug use. ?Limiting alcohol use. ?Practicing safe sex. ?Taking low-dose aspirin every day. ?Taking vitamin and  mineral supplements as recommended by your health care provider. ?What happens during an annual well check? ?The services and screenings done by your health care provider during your annual well check will depend on your age, overall health, lifestyle risk factors, and family history of disease. ?Counseling  ?Your health care provider may ask you questions about your: ?Alcohol use. ?Tobacco use. ?Drug use. ?Emotional well-being. ?Home and relationship well-being. ?Sexual activity. ?Eating habits. ?History of falls. ?Memory and ability to understand (cognition). ?Work and work Statistician. ?Reproductive health. ?Screening  ?You may have the following tests or measurements: ?Height, weight, and BMI. ?Blood pressure. ?Lipid and cholesterol levels. These may be checked every 5 years, or more frequently if you are over 12 years old. ?Skin check. ?Lung cancer screening. You may have this screening every year starting at age 54 if you have a 30-pack-year history of smoking and currently smoke or have quit within the past 15 years. ?Fecal occult blood test (FOBT) of the stool. You may have this test every year starting at age 53. ?Flexible sigmoidoscopy or colonoscopy. You may have a sigmoidoscopy every 5 years or a colonoscopy every 10 years starting at age 68. ?Hepatitis C blood test. ?Hepatitis B blood test. ?Sexually transmitted disease (STD) testing. ?Diabetes screening. This is done by checking your blood sugar (glucose) after you have not eaten for a while (fasting). You may have this done every 1-3 years. ?Bone density scan. This is done to screen for osteoporosis. You may have this done starting at age 33. ?Mammogram. This may be done every 1-2 years. Talk to your health care provider about how often you should have regular mammograms. ?Talk with your health care provider about your test results, treatment options,  and if necessary, the need for more tests. ?Vaccines  ?Your health care provider may recommend  certain vaccines, such as: ?Influenza vaccine. This is recommended every year. ?Tetanus, diphtheria, and acellular pertussis (Tdap, Td) vaccine. You may need a Td booster every 10 years. ?Zoster vaccine. You may need this after age 44. ?Pneumococcal 13-valent conjugate (PCV13) vaccine. One dose is recommended after age 27. ?Pneumococcal polysaccharide (PPSV23) vaccine. One dose is recommended after age 66. ?Talk to your health care provider about which screenings and vaccines you need and how often you need them. ?This information is not intended to replace advice given to you by your health care provider. Make sure you discuss any questions you have with your health care provider. ?Document Released: 10/09/2015 Document Revised: 06/01/2016 Document Reviewed: 07/14/2015 ?Elsevier Interactive Patient Education ? 2017 [place]. ? ?Fall Prevention in the Home ?Falls can cause injuries. They can happen to people of all ages. There are many things you can do to make your home safe and to help prevent falls. ?What can I do on the outside of my home? ?Regularly fix the edges of walkways and driveways and fix any cracks. ?Remove anything that might make you trip as you walk through a door, such as a raised step or threshold. ?Trim any bushes or trees on the path to your home. ?Use bright outdoor lighting. ?Clear any walking paths of anything that might make someone trip, such as rocks or tools. ?Regularly check to see if handrails are loose or broken. Make sure that both sides of any steps have handrails. ?Any raised decks and porches should have guardrails on the edges. ?Have any leaves, snow, or ice cleared regularly. ?Use sand or salt on walking paths during winter. ?Clean up any spills in your garage right away. This includes oil or grease spills. ?What can I do in the bathroom? ?Use night lights. ?Install grab bars by the toilet and in the tub and shower. Do not use towel bars as grab bars. ?Use non-skid mats or  decals in the tub or shower. ?If you need to sit down in the shower, use a plastic, non-slip stool. ?Keep the floor dry. Clean up any water that spills on the floor as soon as it happens. ?Remove soap buildup in the tub or shower regularly. ?Attach bath mats securely with double-sided non-slip rug tape. ?Do not have throw rugs and other things on the floor that can make you trip. ?What can I do in the bedroom? ?Use night lights. ?Make sure that you have a light by your bed that is easy to reach. ?Do not use any sheets or blankets that are too big for your bed. They should not hang down onto the floor. ?Have a firm chair that has side arms. You can use this for support while you get dressed. ?Do not have throw rugs and other things on the floor that can make you trip. ?What can I do in the kitchen? ?Clean up any spills right away. ?Avoid walking on wet floors. ?Keep items that you use a lot in easy-to-reach places. ?If you need to reach something above you, use a strong step stool that has a grab bar. ?Keep electrical cords out of the way. ?Do not use floor polish or wax that makes floors slippery. If you must use wax, use non-skid floor wax. ?Do not have throw rugs and other things on the floor that can make you trip. ?What can I do with my stairs? ?Do not leave  any items on the stairs. ?Make sure that there are handrails on both sides of the stairs and use them. Fix handrails that are broken or loose. Make sure that handrails are as long as the stairways. ?Check any carpeting to make sure that it is firmly attached to the stairs. Fix any carpet that is loose or worn. ?Avoid having throw rugs at the top or bottom of the stairs. If you do have throw rugs, attach them to the floor with carpet tape. ?Make sure that you have a light switch at the top of the stairs and the bottom of the stairs. If you do not have them, ask someone to add them for you. ?What else can I do to help prevent falls? ?Wear shoes that: ?Do not  have high heels. ?Have rubber bottoms. ?Are comfortable and fit you well. ?Are closed at the toe. Do not wear sandals. ?If you use a stepladder: ?Make sure that it is fully opened. Do not climb a closed stepladder.
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
## 2022-01-13 ENCOUNTER — Encounter (HOSPITAL_BASED_OUTPATIENT_CLINIC_OR_DEPARTMENT_OTHER): Payer: Self-pay

## 2022-01-13 ENCOUNTER — Ambulatory Visit (HOSPITAL_BASED_OUTPATIENT_CLINIC_OR_DEPARTMENT_OTHER)
Admission: RE | Admit: 2022-01-13 | Discharge: 2022-01-13 | Disposition: A | Discharge status: Home / Self Care | Payer: Medicare PPO | Source: Ambulatory Visit | Attending: Family | Admitting: Family

## 2022-01-13 DIAGNOSIS — Z780 Asymptomatic menopausal state: Secondary | ICD-10-CM

## 2022-01-20 ENCOUNTER — Encounter: Payer: Self-pay | Admitting: Family

## 2022-01-20 ENCOUNTER — Telehealth: Payer: Self-pay | Admitting: Cardiology

## 2022-01-20 ENCOUNTER — Ambulatory Visit: Payer: Medicare PPO | Admitting: Family

## 2022-01-20 VITALS — BP 128/88 | HR 70 | Temp 98.6°F | Ht 66.0 in | Wt 222.2 lb

## 2022-01-20 DIAGNOSIS — M791 Myalgia, unspecified site: Secondary | ICD-10-CM | POA: Diagnosis not present

## 2022-01-20 DIAGNOSIS — R200 Anesthesia of skin: Secondary | ICD-10-CM | POA: Diagnosis not present

## 2022-01-20 DIAGNOSIS — R5383 Other fatigue: Secondary | ICD-10-CM | POA: Diagnosis not present

## 2022-01-20 DIAGNOSIS — R202 Paresthesia of skin: Secondary | ICD-10-CM

## 2022-01-20 DIAGNOSIS — J019 Acute sinusitis, unspecified: Secondary | ICD-10-CM | POA: Diagnosis not present

## 2022-01-20 DIAGNOSIS — R7309 Other abnormal glucose: Secondary | ICD-10-CM

## 2022-01-20 DIAGNOSIS — E538 Deficiency of other specified B group vitamins: Secondary | ICD-10-CM | POA: Diagnosis not present

## 2022-01-20 DIAGNOSIS — R221 Localized swelling, mass and lump, neck: Secondary | ICD-10-CM

## 2022-01-20 DIAGNOSIS — R7989 Other specified abnormal findings of blood chemistry: Secondary | ICD-10-CM

## 2022-01-20 DIAGNOSIS — H9190 Unspecified hearing loss, unspecified ear: Secondary | ICD-10-CM

## 2022-01-20 LAB — COMPREHENSIVE METABOLIC PANEL
ALT: 11 U/L (ref 0–35)
AST: 14 U/L (ref 0–37)
Albumin: 4.2 g/dL (ref 3.5–5.2)
Alkaline Phosphatase: 63 U/L (ref 39–117)
BUN: 16 mg/dL (ref 6–23)
CO2: 28 mEq/L (ref 19–32)
Calcium: 9.1 mg/dL (ref 8.4–10.5)
Chloride: 106 mEq/L (ref 96–112)
Creatinine, Ser: 0.86 mg/dL (ref 0.40–1.20)
GFR: 68.88 mL/min (ref >60.00)
Glucose, Bld: 94 mg/dL (ref 70–99)
Potassium: 4.1 mEq/L (ref 3.5–5.1)
Sodium: 141 mEq/L (ref 135–145)
Total Bilirubin: 0.7 mg/dL (ref 0.2–1.2)
Total Protein: 6.1 g/dL (ref 6.0–8.3)

## 2022-01-20 LAB — CBC WITH DIFFERENTIAL/PLATELET
Basophils Absolute: 0 10*3/uL (ref 0.0–0.1)
Basophils Relative: 0.8 % (ref 0.0–3.0)
Eosinophils Absolute: 0.1 10*3/uL (ref 0.0–0.7)
Eosinophils Relative: 2.7 % (ref 0.0–5.0)
HCT: 40.5 % (ref 36.0–46.0)
Hemoglobin: 13.7 g/dL (ref 12.0–15.0)
Lymphocytes Relative: 13.3 % (ref 12.0–46.0)
Lymphs Abs: 0.6 10*3/uL — ABNORMAL LOW (ref 0.7–4.0)
MCHC: 33.9 g/dL (ref 30.0–36.0)
MCV: 87.9 fl (ref 78.0–100.0)
Monocytes Absolute: 0.4 10*3/uL (ref 0.1–1.0)
Monocytes Relative: 8.2 % (ref 3.0–12.0)
Neutro Abs: 3.6 10*3/uL (ref 1.4–7.7)
Neutrophils Relative %: 75 % (ref 43.0–77.0)
Platelets: 155 10*3/uL (ref 150.0–400.0)
RBC: 4.61 Mil/uL (ref 3.87–5.11)
RDW: 14.6 % (ref 11.5–15.5)
WBC: 4.8 10*3/uL (ref 4.0–10.5)

## 2022-01-20 LAB — MAGNESIUM: Magnesium: 1.9 mg/dL (ref 1.5–2.5)

## 2022-01-20 LAB — SEDIMENTATION RATE: Sed Rate: 7 mm/hr (ref 0–30)

## 2022-01-20 LAB — HEMOGLOBIN A1C: Hgb A1c MFr Bld: 5.2 % (ref 4.6–6.5)

## 2022-01-20 LAB — VITAMIN B12: Vitamin B-12: 1268 pg/mL — ABNORMAL HIGH (ref 211–911)

## 2022-01-20 MED ORDER — PREDNISONE 20 MG PO TABS: 20.0000 mg | ORAL_TABLET | Freq: Every day | ORAL | 0 refills | Status: DC

## 2022-01-20 MED ORDER — DOXYCYCLINE HYCLATE 100 MG PO TABS: 100.0000 mg | ORAL_TABLET | Freq: Two times a day (BID) | ORAL | 0 refills | Status: DC

## 2022-01-20 NOTE — Telephone Encounter (Signed)
?*  STAT* If patient is at the pharmacy, call can be transferred to refill team. ? ? ?1. Which medications need to be refilled? (please list name of each medication and dose if known)  ? hydrochlorothiazide (HYDRODIURIL) 25 MG tablet  ? ?2. Which pharmacy/location (including street and city if local pharmacy) is medication to be sent to?  ?[facility] [id number] - [address] ?3. Do they need a 30 day or 90 day supply? ?90 day ? ?

## 2022-01-20 NOTE — Patient Instructions (Signed)
Please reach out to your orthopedist about the carpal tunnel symptoms and let me know if you need a referral; ? ?Please call your GI about recurrent abdominal pain;  ?
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
## 2022-01-21 MED ORDER — HYDROCHLOROTHIAZIDE 25 MG PO TABS: 25.0000 mg | ORAL_TABLET | Freq: Every day | ORAL | 3 refills | Status: DC

## 2022-01-23 LAB — RHEUMATOID FACTOR: Rheumatoid fact SerPl-aCnc: <14 IU/mL (ref <14)

## 2022-01-23 LAB — ANA: Anti Nuclear Antibody (ANA): NEGATIVE

## 2022-01-24 ENCOUNTER — Ambulatory Visit: Payer: Medicare PPO | Admitting: Cardiology

## 2022-01-24 ENCOUNTER — Encounter: Payer: Self-pay | Admitting: Cardiology

## 2022-01-24 VITALS — BP 160/80 | HR 57 | Ht 66.0 in | Wt 223.4 lb

## 2022-01-24 DIAGNOSIS — R0602 Shortness of breath: Secondary | ICD-10-CM | POA: Diagnosis not present

## 2022-01-24 DIAGNOSIS — R079 Chest pain, unspecified: Secondary | ICD-10-CM | POA: Diagnosis not present

## 2022-01-24 DIAGNOSIS — I2510 Atherosclerotic heart disease of native coronary artery without angina pectoris: Secondary | ICD-10-CM

## 2022-01-24 MED ORDER — ISOSORBIDE MONONITRATE ER 30 MG PO TB24: 30.0000 mg | ORAL_TABLET | Freq: Every day | ORAL | 3 refills | Status: DC
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
## 2022-01-24 NOTE — Patient Instructions (Signed)
Medication Instructions:  ?Your physician has recommended you make the following change in your medication:  ?START: Imdur 30 mg once daily ?*If you need a refill on your cardiac medications before your next appointment, please call your pharmacy* ? ? ?Lab Work: ?None ?If you have labs (blood work) drawn today and your tests are completely normal, you will receive your results only by: ?MyChart Message (if you have MyChart) OR ?A paper copy in the mail ?If you have any lab test that is abnormal or we need to change your treatment, we will call you to review the results. ? ? ?Testing/Procedures: ?None ? ? ?Follow-Up: ?At [facility], you and your health needs are our priority.  As part of our continuing mission to provide you with exceptional heart care, we have created designated Provider Care Teams.  These Care Teams include your primary Cardiologist (physician) and Advanced Practice Providers (APPs -  Physician Assistants and Nurse Practitioners) who all work together to provide you with the care you need, when you need it. ? ?We recommend signing up for the patient portal called "MyChart".  Sign up information is provided on this After Visit Summary.  MyChart is used to connect with patients for Virtual Visits (Telemedicine).  Patients are able to view lab/test results, encounter notes, upcoming appointments, etc.  Non-urgent messages can be sent to your provider as well.   ?To learn more about what you can do with MyChart, go to [email or web address].   ? ?Your next appointment:   ?3 month(s) ? ?The format for your next appointment:   ?In Person ? ?Provider:   ?Berniece Salines, DO   ? ? ?Other Instructions ? ? ?Important Information About Sugar ? ? ? ? ?  ?

## 2022-01-26 ENCOUNTER — Telehealth: Payer: Self-pay | Admitting: Cardiology

## 2022-01-26 ENCOUNTER — Other Ambulatory Visit: Payer: Self-pay

## 2022-01-26 DIAGNOSIS — R079 Chest pain, unspecified: Secondary | ICD-10-CM

## 2022-01-26 DIAGNOSIS — H532 Diplopia: Secondary | ICD-10-CM

## 2022-01-26 DIAGNOSIS — I2510 Atherosclerotic heart disease of native coronary artery without angina pectoris: Secondary | ICD-10-CM

## 2022-01-26 NOTE — Telephone Encounter (Signed)
Call transferred into triage- patient states she came by a few days ago after seeing her eye doctor who gave her note about having double vision and having to close her right eye to see with concerns of it being a "blockage" problem and not an eye problem. She states she is not sure if starting the new medication (Imdur) is a good idea since she is having eye issues and it can cause headaches. She would like to know what else Dr.Tobb recommends, and if further testing is recommended due to her eye doctor telling her to be evaluated.  ? ?I advised with patient I would route to Dr.Tobb to give recommendations and will call back.  ? ?Thanks! ?

## 2022-01-26 NOTE — Telephone Encounter (Signed)
Called patient, advised of note from Dr.Tobb.  ?Carotid US ordered- patient aware she will be receiving a call.  ? ?Patient states she will go ahead and start the medication given to her.  ? ?

## 2022-01-27 ENCOUNTER — Other Ambulatory Visit: Payer: Self-pay

## 2022-02-14 ENCOUNTER — Ambulatory Visit (HOSPITAL_COMMUNITY)
Admission: RE | Admit: 2022-02-14 | Discharge: 2022-02-14 | Disposition: A | Discharge status: Home / Self Care | Payer: Medicare PPO | Source: Ambulatory Visit | Attending: Internal Medicine | Admitting: Internal Medicine

## 2022-02-14 ENCOUNTER — Other Ambulatory Visit: Payer: Self-pay

## 2022-02-14 DIAGNOSIS — H532 Diplopia: Secondary | ICD-10-CM | POA: Insufficient documentation

## 2022-02-14 DIAGNOSIS — I2510 Atherosclerotic heart disease of native coronary artery without angina pectoris: Secondary | ICD-10-CM | POA: Insufficient documentation

## 2022-02-24 ENCOUNTER — Ambulatory Visit: Payer: Medicare PPO | Admitting: Cardiology

## 2022-02-24 ENCOUNTER — Encounter: Payer: Self-pay | Admitting: Cardiology

## 2022-02-24 VITALS — BP 138/72 | HR 66 | Ht 66.0 in | Wt 219.4 lb

## 2022-02-24 DIAGNOSIS — I2510 Atherosclerotic heart disease of native coronary artery without angina pectoris: Secondary | ICD-10-CM | POA: Diagnosis not present

## 2022-02-24 DIAGNOSIS — E782 Mixed hyperlipidemia: Secondary | ICD-10-CM | POA: Diagnosis not present

## 2022-02-24 DIAGNOSIS — R2681 Unsteadiness on feet: Secondary | ICD-10-CM

## 2022-02-24 DIAGNOSIS — I10 Essential (primary) hypertension: Secondary | ICD-10-CM

## 2022-02-24 MED ORDER — ISOSORBIDE MONONITRATE ER 60 MG PO TB24: 60.0000 mg | ORAL_TABLET | Freq: Every day | ORAL | 3 refills | Status: DC
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
## 2022-02-24 NOTE — Patient Instructions (Addendum)
Medication Instructions:  Your physician has recommended you make the following change in your medication:  INCREASE: Imdur 60 mg once daily  *If you need a refill on your cardiac medications before your next appointment, please call your pharmacy*   Lab Work: None If you have labs (blood work) drawn today and your tests are completely normal, you will receive your results only by: [place] (if you have MyChart) OR A paper copy in the mail If you have any lab test that is abnormal or we need to change your treatment, we will call you to review the results.   Testing/Procedures: None   Follow-Up: At [facility], you and your health needs are our priority.  As part of our continuing mission to provide you with exceptional heart care, we have created designated Provider Care Teams.  These Care Teams include your primary Cardiologist (physician) and Advanced Practice Providers (APPs -  Physician Assistants and Nurse Practitioners) who all work together to provide you with the care you need, when you need it.  We recommend signing up for the patient portal called "MyChart".  Sign up information is provided on this After Visit Summary.  MyChart is used to connect with patients for Virtual Visits (Telemedicine).  Patients are able to view lab/test results, encounter notes, upcoming appointments, etc.  Non-urgent messages can be sent to your provider as well.   To learn more about what you can do with MyChart, go to [email or web address].    Your next appointment:   3 month(s)  The format for your next appointment:   In Person  Provider:   Berniece Salines, DO     Other Instructions Please discuss with your PCP about a referral to Neurology for gait instability.   Important Information About Sugar

## 2022-03-03 ENCOUNTER — Ambulatory Visit: Payer: Medicare PPO | Admitting: Family

## 2022-03-03 VITALS — BP 118/80 | HR 66 | Temp 97.9°F | Resp 18 | Ht 66.0 in | Wt 219.0 lb

## 2022-03-03 DIAGNOSIS — S80862A Insect bite (nonvenomous), left lower leg, initial encounter: Secondary | ICD-10-CM | POA: Diagnosis not present

## 2022-03-03 DIAGNOSIS — R202 Paresthesia of skin: Secondary | ICD-10-CM

## 2022-03-03 DIAGNOSIS — R2689 Other abnormalities of gait and mobility: Secondary | ICD-10-CM | POA: Diagnosis not present

## 2022-03-03 DIAGNOSIS — F909 Attention-deficit hyperactivity disorder, unspecified type: Secondary | ICD-10-CM | POA: Diagnosis not present

## 2022-03-03 DIAGNOSIS — F419 Anxiety disorder, unspecified: Secondary | ICD-10-CM | POA: Diagnosis not present

## 2022-03-03 DIAGNOSIS — W57XXXA Bitten or stung by nonvenomous insect and other nonvenomous arthropods, initial encounter: Secondary | ICD-10-CM

## 2022-03-03 DIAGNOSIS — F3341 Major depressive disorder, recurrent, in partial remission: Secondary | ICD-10-CM | POA: Diagnosis not present

## 2022-03-03 DIAGNOSIS — H532 Diplopia: Secondary | ICD-10-CM | POA: Diagnosis not present

## 2022-03-03 MED ORDER — DOXYCYCLINE HYCLATE 100 MG PO TABS: ORAL_TABLET | ORAL | 0 refills | Status: DC
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
## 2022-03-10 ENCOUNTER — Ambulatory Visit (HOSPITAL_BASED_OUTPATIENT_CLINIC_OR_DEPARTMENT_OTHER)
Admission: RE | Admit: 2022-03-10 | Discharge: 2022-03-10 | Disposition: A | Discharge status: Home / Self Care | Payer: Medicare PPO | Source: Ambulatory Visit | Attending: Family | Admitting: Family

## 2022-03-10 DIAGNOSIS — R202 Paresthesia of skin: Secondary | ICD-10-CM

## 2022-03-10 DIAGNOSIS — R2689 Other abnormalities of gait and mobility: Secondary | ICD-10-CM | POA: Insufficient documentation

## 2022-03-10 DIAGNOSIS — H532 Diplopia: Secondary | ICD-10-CM | POA: Diagnosis not present

## 2022-03-11 ENCOUNTER — Telehealth: Payer: Self-pay

## 2022-03-11 ENCOUNTER — Other Ambulatory Visit: Payer: Self-pay | Admitting: Family

## 2022-03-11 MED ORDER — AMOXICILLIN-POT CLAVULANATE 875-125 MG PO TABS: 1.0000 | ORAL_TABLET | Freq: Two times a day (BID) | ORAL | 0 refills | Status: AC

## 2022-03-14 NOTE — Telephone Encounter (Signed)
done

## 2022-03-23 DIAGNOSIS — G5622 Lesion of ulnar nerve, left upper limb: Secondary | ICD-10-CM | POA: Diagnosis not present

## 2022-03-23 DIAGNOSIS — G5602 Carpal tunnel syndrome, left upper limb: Secondary | ICD-10-CM | POA: Diagnosis not present

## 2022-03-28 DIAGNOSIS — R1319 Other dysphagia: Secondary | ICD-10-CM | POA: Diagnosis not present

## 2022-03-28 DIAGNOSIS — R0789 Other chest pain: Secondary | ICD-10-CM | POA: Diagnosis not present

## 2022-03-28 DIAGNOSIS — K222 Esophageal obstruction: Secondary | ICD-10-CM | POA: Diagnosis not present

## 2022-04-01 ENCOUNTER — Other Ambulatory Visit: Payer: Self-pay | Admitting: *Deleted

## 2022-04-01 ENCOUNTER — Telehealth: Payer: Self-pay | Admitting: Family

## 2022-04-01 MED ORDER — FAMOTIDINE 40 MG PO TABS: 40.0000 mg | ORAL_TABLET | Freq: Every day | ORAL | 1 refills | Status: DC

## 2022-04-01 NOTE — Telephone Encounter (Signed)
Refill sent.

## 2022-04-01 NOTE — Telephone Encounter (Signed)
Medication:   famotidine (PEPCID) 40 MG tablet [[id number]]   Has the patient contacted their pharmacy? No. (If no, request that the patient contact the pharmacy for the refill.) (If yes, when and what did the pharmacy advise?)  Preferred Pharmacy (with phone number or street name):   [id number] - [address], [place] [address]  Phone:  [phone]  Fax:  [phone]   Agent: Please be advised that RX refills may take up to 3 business days. We ask that you follow-up with your pharmacy.

## 2022-04-04 DIAGNOSIS — M13842 Other specified arthritis, left hand: Secondary | ICD-10-CM | POA: Diagnosis not present

## 2022-04-04 DIAGNOSIS — M13849 Other specified arthritis, unspecified hand: Secondary | ICD-10-CM | POA: Diagnosis not present

## 2022-04-04 DIAGNOSIS — G5602 Carpal tunnel syndrome, left upper limb: Secondary | ICD-10-CM | POA: Diagnosis not present

## 2022-04-04 DIAGNOSIS — G5622 Lesion of ulnar nerve, left upper limb: Secondary | ICD-10-CM | POA: Diagnosis not present

## 2022-04-04 DIAGNOSIS — M79642 Pain in left hand: Secondary | ICD-10-CM | POA: Diagnosis not present

## 2022-04-04 DIAGNOSIS — M67432 Ganglion, left wrist: Secondary | ICD-10-CM | POA: Diagnosis not present

## 2022-04-07 DIAGNOSIS — H903 Sensorineural hearing loss, bilateral: Secondary | ICD-10-CM | POA: Diagnosis not present

## 2022-04-07 DIAGNOSIS — J329 Chronic sinusitis, unspecified: Secondary | ICD-10-CM | POA: Diagnosis not present

## 2022-04-08 ENCOUNTER — Telehealth: Payer: Self-pay

## 2022-04-08 NOTE — Telephone Encounter (Signed)
   Pre-operative Risk Assessment    Patient Name: Pamela Sims  DOB: 04-18-1951 MRN: [id number]      Request for Surgical Clearance    Procedure:   Left open carpal tunnel release. Left ulnar nerve release in situ with possible transposition if necessary.  Date of Surgery:  Clearance TBD                                 Surgeon:  Dr. Roseanne Kaufman Surgeon's Group or Practice Name:  [facility] Phone number:  [phone] Fax number:  [phone]   Type of Clearance Requested:   - Medical    Type of Anesthesia:  General    Additional requests/questions:  Please fax your recommendations to [facility] [phone]  Signed, Monia Pouch   04/08/2022, 1:56 PM

## 2022-04-20 ENCOUNTER — Ambulatory Visit: Payer: Medicare PPO | Admitting: Diagnostic Neuroimaging

## 2022-04-20 ENCOUNTER — Encounter: Payer: Self-pay | Admitting: Diagnostic Neuroimaging

## 2022-04-20 VITALS — BP 120/71 | HR 62 | Ht 66.0 in | Wt 221.2 lb

## 2022-04-20 DIAGNOSIS — H532 Diplopia: Secondary | ICD-10-CM | POA: Diagnosis not present

## 2022-04-20 DIAGNOSIS — R269 Unspecified abnormalities of gait and mobility: Secondary | ICD-10-CM
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
## 2022-04-21 ENCOUNTER — Telehealth: Payer: Self-pay | Admitting: Diagnostic Neuroimaging

## 2022-04-21 NOTE — Telephone Encounter (Signed)
Pamela Sims: [id number] exp. 04/21/22-05/21/22 sent to GI

## 2022-04-28 ENCOUNTER — Ambulatory Visit: Payer: Medicare PPO | Admitting: Cardiology

## 2022-04-29 LAB — CK: Total CK: 66 U/L (ref 32–182)

## 2022-04-29 LAB — ACHR ABS WITH REFLEX TO MUSK: AChR Binding Ab, Serum: <0.03 nmol/L (ref 0.00–0.24)

## 2022-04-29 LAB — MUSK ANTIBODIES: MuSK Antibodies: <1 U/mL

## 2022-04-29 LAB — ALDOLASE: Aldolase: 4.5 U/L (ref 3.3–10.3)

## 2022-05-02 ENCOUNTER — Other Ambulatory Visit: Payer: Self-pay | Admitting: Cardiology

## 2022-05-03 ENCOUNTER — Telehealth: Payer: Self-pay

## 2022-05-03 NOTE — Telephone Encounter (Signed)
Called pt and notified her about her lab results. She thanked me and appreciated the call.

## 2022-05-05 ENCOUNTER — Ambulatory Visit
Admission: RE | Admit: 2022-05-05 | Discharge: 2022-05-05 | Disposition: A | Discharge status: Home / Self Care | Payer: Medicare PPO | Source: Ambulatory Visit | Attending: Diagnostic Neuroimaging | Admitting: Diagnostic Neuroimaging

## 2022-05-05 DIAGNOSIS — H532 Diplopia: Secondary | ICD-10-CM

## 2022-05-05 DIAGNOSIS — R269 Unspecified abnormalities of gait and mobility: Secondary | ICD-10-CM

## 2022-05-05 MED ORDER — GADOBENATE DIMEGLUMINE 529 MG/ML IV SOLN
20.0000 mL | Freq: Once | INTRAVENOUS | Status: AC | PRN
  Administered 2022-05-05: 20 mL via INTRAVENOUS

## 2022-05-09 DIAGNOSIS — J329 Chronic sinusitis, unspecified: Secondary | ICD-10-CM | POA: Diagnosis not present

## 2022-05-11 DIAGNOSIS — J329 Chronic sinusitis, unspecified: Secondary | ICD-10-CM | POA: Diagnosis not present

## 2022-05-11 DIAGNOSIS — J343 Hypertrophy of nasal turbinates: Secondary | ICD-10-CM | POA: Diagnosis not present

## 2022-05-11 DIAGNOSIS — G4733 Obstructive sleep apnea (adult) (pediatric): Secondary | ICD-10-CM | POA: Diagnosis not present

## 2022-05-16 ENCOUNTER — Telehealth: Payer: Self-pay

## 2022-05-16 NOTE — Telephone Encounter (Signed)
Called pt back and notified her of advice. She states that she will follow up with her PCP.

## 2022-05-16 NOTE — Telephone Encounter (Signed)
Called pt to relay results she understood. She states that she is still having symptoms of severe pain in left ear that starts in her ear down to the back of her neck, still having double vision, experiencing forgetfulness and also not having good balance when she walks as if her body steers to the wrong way that she is going. She is asking for advice to help with her symptoms. Please advise.

## 2022-05-16 NOTE — Telephone Encounter (Signed)
-----   Message from Penni Bombard, MD sent at 05/16/2022 10:39 AM EDT ----- Unremarkable imaging results. Please call patient. Continue current plan. -VRP

## 2022-06-27 ENCOUNTER — Telehealth: Payer: Self-pay | Admitting: Cardiology

## 2022-06-27 ENCOUNTER — Encounter: Payer: Self-pay | Admitting: Cardiology

## 2022-06-27 ENCOUNTER — Ambulatory Visit: Payer: Medicare PPO | Attending: Cardiology | Admitting: Cardiology

## 2022-06-27 VITALS — BP 170/80 | HR 56 | Ht 66.0 in | Wt 219.0 lb

## 2022-06-27 DIAGNOSIS — E782 Mixed hyperlipidemia: Secondary | ICD-10-CM | POA: Diagnosis not present

## 2022-06-27 DIAGNOSIS — I25118 Atherosclerotic heart disease of native coronary artery with other forms of angina pectoris: Secondary | ICD-10-CM | POA: Diagnosis not present

## 2022-06-27 DIAGNOSIS — I10 Essential (primary) hypertension: Secondary | ICD-10-CM | POA: Diagnosis not present

## 2022-06-27 DIAGNOSIS — R400 Somnolence: Secondary | ICD-10-CM | POA: Diagnosis not present

## 2022-06-27 DIAGNOSIS — R0683 Snoring: Secondary | ICD-10-CM | POA: Diagnosis not present

## 2022-06-27 MED ORDER — ATORVASTATIN CALCIUM 20 MG PO TABS: 20.0000 mg | ORAL_TABLET | Freq: Every day | ORAL | 3 refills | Status: DC

## 2022-06-27 MED ORDER — VALSARTAN 80 MG PO TABS: 80.0000 mg | ORAL_TABLET | Freq: Every day | ORAL | 3 refills | Status: DC

## 2022-06-27 NOTE — Telephone Encounter (Signed)
  Pt is calling to schedule her Heart Cath

## 2022-06-27 NOTE — Patient Instructions (Signed)
Medication Instructions:  Your physician has recommended you make the following change in your medication:  STOP: Ramipril START: Valsartan '80mg'$  daily  *If you need a refill on your cardiac medications before your next appointment, please call your pharmacy*   Lab Work: Your physician recommends that you have the following labs drawn today: BMET, CBC and Magnesium  If you have labs (blood work) drawn today and your tests are completely normal, you will receive your results only by: [place] (if you have MyChart) OR A paper copy in the mail If you have any lab test that is abnormal or we need to change your treatment, we will call you to review the results.   Testing/Procedures: Your physician has requested that you have a cardiac catheterization. Cardiac catheterization is used to diagnose and/or treat various heart conditions. Doctors may recommend this procedure for a number of different reasons. The most common reason is to evaluate chest pain. Chest pain can be a symptom of coronary artery disease (CAD), and cardiac catheterization can show whether plaque is narrowing or blocking your heart's arteries. This procedure is also used to evaluate the valves, as well as measure the blood flow and oxygen levels in different parts of your heart. For further information please visit [email or web address]. Please follow instruction sheet, as given.    MODEST DRAEGER  06/27/2022  You are scheduled for a Cardiac Catheterization on Thursday, October 12 with Dr. Lauree Chandler.  1. Please arrive at the [facility] (Main Entrance A) at [facility]: [address] [place], [place] [address] at 7:00 AM (This time is two hours before your procedure to ensure your preparation). Free valet parking service is available.   Special note: Every effort is made to have your procedure done on time. Please understand that emergencies sometimes delay scheduled procedures.  2. Diet: Do not eat  solid foods after midnight.  The patient may have clear liquids until 5am upon the day of the procedure.  3. Labs will be drawn at your office visit today.   4. Medication instructions in preparation for your procedure:   Contrast Allergy: No  On the morning of your procedure, take your Aspirin 81 mg and any morning medicines NOT listed above.  You may use sips of water.  5. Plan for one night stay--bring personal belongings. 6. Bring a current list of your medications and current insurance cards. 7. You MUST have a responsible person to drive you home. 8. Someone MUST be with you the first 24 hours after you arrive home or your discharge will be delayed. 9. Please wear clothes that are easy to get on and off and wear slip-on shoes.  Thank you for allowing [place] to care for you!   -- [facility]    Follow-Up: At [facility], you and your health needs are our priority.  As part of our continuing mission to provide you with exceptional heart care, we have created designated Provider Care Teams.  These Care Teams include your primary Cardiologist (physician) and Advanced Practice Providers (APPs -  Physician Assistants and Nurse Practitioners) who all work together to provide you with the care you need, when you need it.  We recommend signing up for the patient portal called "MyChart".  Sign up information is provided on this After Visit Summary.  MyChart is used to connect with patients for Virtual Visits (Telemedicine).  Patients are able to view lab/test results, encounter notes, upcoming appointments, etc.  Non-urgent messages can be sent to your provider as well.   To learn more about what you can do with MyChart, go to [email or web address].    Your next appointment:   2 week(s)  The format for your next appointment:   In Person  Provider:   Berniece Salines, DO

## 2022-06-28 LAB — CBC
Hematocrit: 41.2 % (ref 34.0–46.6)
Hemoglobin: 13.7 g/dL (ref 11.1–15.9)
MCH: 29.3 pg (ref 26.6–33.0)
MCHC: 33.3 g/dL (ref 31.5–35.7)
MCV: 88 fL (ref 79–97)
Platelets: 152 10*3/uL (ref 150–450)
RBC: 4.68 x10E6/uL (ref 3.77–5.28)
RDW: 13.7 % (ref 11.7–15.4)
WBC: 4.8 10*3/uL (ref 3.4–10.8)

## 2022-06-28 LAB — BASIC METABOLIC PANEL
BUN/Creatinine Ratio: 17 (ref 12–28)
BUN: 15 mg/dL (ref 8–27)
CO2: 29 mmol/L (ref 20–29)
Calcium: 9.8 mg/dL (ref 8.7–10.3)
Chloride: 101 mmol/L (ref 96–106)
Creatinine, Ser: 0.89 mg/dL (ref 0.57–1.00)
Glucose: 91 mg/dL (ref 70–99)
Potassium: 4.4 mmol/L (ref 3.5–5.2)
Sodium: 144 mmol/L (ref 134–144)
eGFR: 70 mL/min/{1.73_m2} (ref >59)

## 2022-06-28 LAB — MAGNESIUM: Magnesium: 2.2 mg/dL (ref 1.6–2.3)

## 2022-06-28 NOTE — Progress Notes (Addendum)
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   Finally, stay active  Signed, Berniece Salines, DO  06/28/2022 1:52 PM    [facility]

## 2022-06-28 NOTE — H&P (View-Only) (Signed)
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  Signed, Pamela Salines, DO  06/28/2022 1:52 PM    [facility]

## 2022-06-28 NOTE — Telephone Encounter (Signed)
Pt is returning call and states that Wednesdays would be preferable to reschedule her heart cath appt. Requesting call back to schedule.

## 2022-06-28 NOTE — Telephone Encounter (Signed)
Spoke to patient she stated she needs to reschedule cardiac cath.Stated she will be going out of town 10/13.Advised I will send message to Dr.Tobb's RN.

## 2022-06-29 ENCOUNTER — Other Ambulatory Visit: Payer: Self-pay | Admitting: Cardiology

## 2022-06-29 DIAGNOSIS — I10 Essential (primary) hypertension: Secondary | ICD-10-CM

## 2022-06-29 DIAGNOSIS — R0683 Snoring: Secondary | ICD-10-CM

## 2022-06-30 NOTE — Telephone Encounter (Signed)
Calling back for update. Please advise  

## 2022-07-01 ENCOUNTER — Encounter: Payer: Self-pay | Admitting: *Deleted

## 2022-07-01 ENCOUNTER — Telehealth: Payer: Self-pay | Admitting: Cardiology

## 2022-07-01 ENCOUNTER — Telehealth: Payer: Self-pay

## 2022-07-01 NOTE — Telephone Encounter (Signed)
Patient called to get clarification on when she will be scheduled for the catheterization.  Patient is also concerned she is being scheduled for a sleep study.

## 2022-07-01 NOTE — Telephone Encounter (Signed)
Spoke with pt, cath rescheduled for 07/13/22 @ 8:30 am with dr Tamala Julian. We discussed instructions over the phone and will mail her a letter with the instructions. Aware will forward this message to Dr Harriet Masson regarding the sleep study as there is no mention of that in her chart. Pt agreed with this plan.

## 2022-07-01 NOTE — Telephone Encounter (Signed)
Called pt. See note.

## 2022-07-01 NOTE — Telephone Encounter (Signed)
Called pt after speaking with Dr. Harriet Masson she is in fact supposed to have a sleep study. Pt states she someone called her to set it up she told them she did not need a sleep study she only needed a cath. Pt aware and agreeable with having the sleep study. Will call to have sleep staff reach back out to pt to set up appointment.

## 2022-07-01 NOTE — Telephone Encounter (Signed)
See telephone note from today.

## 2022-07-07 ENCOUNTER — Encounter (HOSPITAL_COMMUNITY): Payer: Self-pay

## 2022-07-07 ENCOUNTER — Ambulatory Visit (HOSPITAL_COMMUNITY): Admit: 2022-07-07 | Payer: Medicare PPO | Admitting: Cardiovascular Disease

## 2022-07-07 SURGERY — LEFT HEART CATH AND CORONARY ANGIOGRAPHY
Anesthesia: LOCAL

## 2022-07-11 ENCOUNTER — Telehealth: Payer: Self-pay | Admitting: *Deleted

## 2022-07-11 NOTE — Telephone Encounter (Signed)
Cardiac Catheterization scheduled at [facility] for: Wednesday July 13, 2022 8:30 AM Arrival time and place: [place] Entrance A at: 6:30 AM  Nothing to eat after midnight prior to procedure, clear liquids until 5 AM day of procedure.  Medication instructions: -Hold:  HCTZ-AM of procedure -Except hold medications usual morning medications can be taken with sips of water including aspirin 81 mg.  Confirmed patient has responsible adult to drive home post procedure and be with patient first 24 hours after arriving home.  Patient reports no new symptoms concerning for COVID-19 in the past 10 days.  Left message to call back to review procedure instructions with patient.

## 2022-07-11 NOTE — Telephone Encounter (Signed)
Patient returning call.

## 2022-07-11 NOTE — Telephone Encounter (Signed)
Reviewed procedure instructions with patient.  

## 2022-07-12 NOTE — H&P (Signed)
Chest pain, esophageal disease, chest pain and dyspnea unrelieved by up titration of medical therapy.  Coronary CTA in February 2022 Coronary calcium score 299, moderate less than 70% mid LAD, nondominant circumflex, and RCA with some mild plaque.  Unable to see the actual CT FFR data. Risk factors include hypertension, hyperlipidemia, obesity, and age.

## 2022-07-13 ENCOUNTER — Ambulatory Visit (HOSPITAL_COMMUNITY)
Admission: RE | Disposition: A | Discharge status: Home / Self Care | Payer: Self-pay | Source: Ambulatory Visit | Attending: Interventional Cardiology

## 2022-07-13 ENCOUNTER — Other Ambulatory Visit: Payer: Self-pay

## 2022-07-13 ENCOUNTER — Other Ambulatory Visit (HOSPITAL_COMMUNITY): Payer: Self-pay

## 2022-07-13 ENCOUNTER — Observation Stay (HOSPITAL_COMMUNITY)
Admission: RE | Admit: 2022-07-13 | Discharge: 2022-07-14 | Disposition: A | Discharge status: Home / Self Care | Payer: Medicare PPO | Source: Ambulatory Visit | Attending: Interventional Cardiology | Admitting: Interventional Cardiology

## 2022-07-13 DIAGNOSIS — E782 Mixed hyperlipidemia: Secondary | ICD-10-CM | POA: Diagnosis present

## 2022-07-13 DIAGNOSIS — J45909 Unspecified asthma, uncomplicated: Secondary | ICD-10-CM | POA: Diagnosis not present

## 2022-07-13 DIAGNOSIS — Z96653 Presence of artificial knee joint, bilateral: Secondary | ICD-10-CM | POA: Insufficient documentation

## 2022-07-13 DIAGNOSIS — Z79899 Other long term (current) drug therapy: Secondary | ICD-10-CM | POA: Insufficient documentation

## 2022-07-13 DIAGNOSIS — Z6839 Body mass index (BMI) 39.0-39.9, adult: Secondary | ICD-10-CM | POA: Insufficient documentation

## 2022-07-13 DIAGNOSIS — E669 Obesity, unspecified: Secondary | ICD-10-CM | POA: Diagnosis present

## 2022-07-13 DIAGNOSIS — Z955 Presence of coronary angioplasty implant and graft: Secondary | ICD-10-CM

## 2022-07-13 DIAGNOSIS — R079 Chest pain, unspecified: Secondary | ICD-10-CM | POA: Diagnosis not present

## 2022-07-13 DIAGNOSIS — Z7982 Long term (current) use of aspirin: Secondary | ICD-10-CM | POA: Diagnosis not present

## 2022-07-13 DIAGNOSIS — G4733 Obstructive sleep apnea (adult) (pediatric): Secondary | ICD-10-CM | POA: Diagnosis present

## 2022-07-13 DIAGNOSIS — Z87891 Personal history of nicotine dependence: Secondary | ICD-10-CM | POA: Diagnosis not present

## 2022-07-13 DIAGNOSIS — I10 Essential (primary) hypertension: Secondary | ICD-10-CM | POA: Diagnosis not present

## 2022-07-13 DIAGNOSIS — G8929 Other chronic pain: Secondary | ICD-10-CM | POA: Diagnosis present

## 2022-07-13 DIAGNOSIS — E785 Hyperlipidemia, unspecified: Secondary | ICD-10-CM | POA: Diagnosis present

## 2022-07-13 DIAGNOSIS — I25119 Atherosclerotic heart disease of native coronary artery with unspecified angina pectoris: Principal | ICD-10-CM | POA: Insufficient documentation

## 2022-07-13 DIAGNOSIS — I2510 Atherosclerotic heart disease of native coronary artery without angina pectoris: Secondary | ICD-10-CM | POA: Diagnosis present

## 2022-07-13 DIAGNOSIS — I209 Angina pectoris, unspecified: Secondary | ICD-10-CM

## 2022-07-13 HISTORY — PX: LEFT HEART CATH AND CORONARY ANGIOGRAPHY: CATH118249

## 2022-07-13 HISTORY — PX: CORONARY STENT INTERVENTION: CATH118234

## 2022-07-13 HISTORY — PX: INTRAVASCULAR PRESSURE WIRE/FFR STUDY: CATH118243

## 2022-07-13 SURGERY — LEFT HEART CATH AND CORONARY ANGIOGRAPHY
Anesthesia: LOCAL

## 2022-07-13 MED ORDER — SODIUM CHLORIDE 0.9 % WEIGHT BASED INFUSION
3.0000 mL/kg/h | INTRAVENOUS | Status: DC
  Administered 2022-07-13: 3 mL/kg/h via INTRAVENOUS

## 2022-07-13 MED ORDER — ESCITALOPRAM OXALATE 10 MG PO TABS
10.0000 mg | ORAL_TABLET | Freq: Every day | ORAL | Status: DC
  Administered 2022-07-13: 10 mg via ORAL
  Filled 2022-07-13: qty 1

## 2022-07-13 MED ORDER — HEPARIN SODIUM (PORCINE) 1000 UNIT/ML IJ SOLN
INTRAMUSCULAR | Status: AC
  Filled 2022-07-13: qty 10

## 2022-07-13 MED ORDER — NITROGLYCERIN 0.4 MG SL SUBL
0.4000 mg | SUBLINGUAL_TABLET | SUBLINGUAL | 1 refills | Status: DC | PRN
  Filled 2022-07-13: qty 25, 25d supply, fill #0

## 2022-07-13 MED ORDER — CLOPIDOGREL BISULFATE 300 MG PO TABS
ORAL_TABLET | ORAL | Status: DC | PRN
  Administered 2022-07-13: 600 mg via ORAL

## 2022-07-13 MED ORDER — DILTIAZEM HCL ER COATED BEADS 120 MG PO CP24
120.0000 mg | ORAL_CAPSULE | Freq: Every day | ORAL | Status: DC
  Administered 2022-07-14: 120 mg via ORAL
  Filled 2022-07-13: qty 1

## 2022-07-13 MED ORDER — HEPARIN SODIUM (PORCINE) 1000 UNIT/ML IJ SOLN
INTRAMUSCULAR | Status: DC | PRN
  Administered 2022-07-13: 7500 [IU] via INTRAVENOUS
  Administered 2022-07-13: 5000 [IU] via INTRAVENOUS

## 2022-07-13 MED ORDER — NITROGLYCERIN 0.4 MG SL SUBL: 0.4000 mg | SUBLINGUAL_TABLET | SUBLINGUAL | Status: DC | PRN

## 2022-07-13 MED ORDER — CLOPIDOGREL BISULFATE 75 MG PO TABS
75.0000 mg | ORAL_TABLET | Freq: Every day | ORAL | 3 refills | Status: AC
  Filled 2022-07-13: qty 30, 30d supply, fill #0

## 2022-07-13 MED ORDER — LIDOCAINE HCL (PF) 1 % IJ SOLN
INTRAMUSCULAR | Status: DC | PRN
  Administered 2022-07-13: 2 mL

## 2022-07-13 MED ORDER — MIDAZOLAM HCL 2 MG/2ML IJ SOLN
INTRAMUSCULAR | Status: DC | PRN
  Administered 2022-07-13: 1 mg via INTRAVENOUS
  Administered 2022-07-13: .5 mg via INTRAVENOUS

## 2022-07-13 MED ORDER — ATORVASTATIN CALCIUM 40 MG PO TABS
40.0000 mg | ORAL_TABLET | Freq: Every day | ORAL | 3 refills | Status: DC
  Filled 2022-07-13: qty 30, 30d supply, fill #0

## 2022-07-13 MED ORDER — HEPARIN (PORCINE) IN NACL 1000-0.9 UT/500ML-% IV SOLN
INTRAVENOUS | Status: DC | PRN
  Administered 2022-07-13 (×2): 500 mL

## 2022-07-13 MED ORDER — SODIUM CHLORIDE 0.9% FLUSH
3.0000 mL | Freq: Two times a day (BID) | INTRAVENOUS | Status: DC
  Administered 2022-07-13: 3 mL via INTRAVENOUS

## 2022-07-13 MED ORDER — FENTANYL CITRATE (PF) 100 MCG/2ML IJ SOLN
INTRAMUSCULAR | Status: DC | PRN
  Administered 2022-07-13: 50 ug via INTRAVENOUS
  Administered 2022-07-13: 25 ug via INTRAVENOUS

## 2022-07-13 MED ORDER — SODIUM CHLORIDE 0.9 % IV SOLN: 250.0000 mL | INTRAVENOUS | Status: DC | PRN

## 2022-07-13 MED ORDER — OXYCODONE HCL 5 MG PO TABS: 5.0000 mg | ORAL_TABLET | ORAL | Status: DC | PRN

## 2022-07-13 MED ORDER — IOHEXOL 350 MG/ML SOLN
INTRAVENOUS | Status: DC | PRN
  Administered 2022-07-13: 180 mL

## 2022-07-13 MED ORDER — MIDAZOLAM HCL 2 MG/2ML IJ SOLN
INTRAMUSCULAR | Status: AC
  Filled 2022-07-13: qty 2

## 2022-07-13 MED ORDER — ADULT MULTIVITAMIN W/MINERALS CH
1.0000 | ORAL_TABLET | Freq: Every day | ORAL | Status: DC
  Administered 2022-07-13: 1 via ORAL
  Filled 2022-07-13 (×2): qty 1

## 2022-07-13 MED ORDER — VITAMIN B-12 1000 MCG PO TABS
1000.0000 ug | ORAL_TABLET | Freq: Every day | ORAL | Status: DC
  Administered 2022-07-14: 1000 ug via ORAL
  Filled 2022-07-13: qty 1

## 2022-07-13 MED ORDER — SODIUM CHLORIDE 0.9% FLUSH: 3.0000 mL | INTRAVENOUS | Status: DC | PRN

## 2022-07-13 MED ORDER — FAMOTIDINE IN NACL 20-0.9 MG/50ML-% IV SOLN
INTRAVENOUS | Status: AC
  Filled 2022-07-13: qty 50

## 2022-07-13 MED ORDER — ASPIRIN 81 MG PO CHEW: 81.0000 mg | CHEWABLE_TABLET | ORAL | Status: DC

## 2022-07-13 MED ORDER — ISOSORBIDE MONONITRATE ER 60 MG PO TB24
60.0000 mg | ORAL_TABLET | Freq: Every day | ORAL | Status: DC
  Administered 2022-07-13 – 2022-07-14 (×2): 60 mg via ORAL
  Filled 2022-07-13 (×2): qty 1

## 2022-07-13 MED ORDER — FAMOTIDINE 20 MG PO TABS
40.0000 mg | ORAL_TABLET | Freq: Every day | ORAL | Status: DC
  Administered 2022-07-13: 40 mg via ORAL
  Filled 2022-07-13: qty 2

## 2022-07-13 MED ORDER — LIDOCAINE HCL (PF) 1 % IJ SOLN
INTRAMUSCULAR | Status: AC
  Filled 2022-07-13: qty 30

## 2022-07-13 MED ORDER — VERAPAMIL HCL 2.5 MG/ML IV SOLN
INTRAVENOUS | Status: DC | PRN
  Administered 2022-07-13: 10 mL via INTRA_ARTERIAL

## 2022-07-13 MED ORDER — ASPIRIN 81 MG PO CHEW
81.0000 mg | CHEWABLE_TABLET | Freq: Every day | ORAL | Status: DC
  Administered 2022-07-14: 81 mg via ORAL
  Filled 2022-07-13: qty 1

## 2022-07-13 MED ORDER — SODIUM CHLORIDE 0.9 % WEIGHT BASED INFUSION: 1.0000 mL/kg/h | INTRAVENOUS | Status: DC

## 2022-07-13 MED ORDER — HYDROCHLOROTHIAZIDE 25 MG PO TABS
25.0000 mg | ORAL_TABLET | Freq: Every day | ORAL | Status: DC
  Administered 2022-07-14: 25 mg via ORAL
  Filled 2022-07-13: qty 1

## 2022-07-13 MED ORDER — VITAMIN D 25 MCG (1000 UNIT) PO TABS
1000.0000 [IU] | ORAL_TABLET | Freq: Every day | ORAL | Status: DC
  Administered 2022-07-14: 1000 [IU] via ORAL
  Filled 2022-07-13: qty 1

## 2022-07-13 MED ORDER — FENTANYL CITRATE (PF) 100 MCG/2ML IJ SOLN
INTRAMUSCULAR | Status: AC
  Filled 2022-07-13: qty 2

## 2022-07-13 MED ORDER — PANTOPRAZOLE SODIUM 40 MG PO TBEC
40.0000 mg | DELAYED_RELEASE_TABLET | Freq: Two times a day (BID) | ORAL | Status: DC
  Administered 2022-07-13 – 2022-07-14 (×3): 40 mg via ORAL
  Filled 2022-07-13 (×3): qty 1

## 2022-07-13 MED ORDER — ACETAMINOPHEN 325 MG PO TABS: 650.0000 mg | ORAL_TABLET | ORAL | Status: DC | PRN

## 2022-07-13 MED ORDER — NITROGLYCERIN 1 MG/10 ML FOR IR/CATH LAB
INTRA_ARTERIAL | Status: AC
  Filled 2022-07-13: qty 10

## 2022-07-13 MED ORDER — ONDANSETRON HCL 4 MG/2ML IJ SOLN: 4.0000 mg | Freq: Four times a day (QID) | INTRAMUSCULAR | Status: DC | PRN

## 2022-07-13 MED ORDER — ATORVASTATIN CALCIUM 40 MG PO TABS: 40.0000 mg | ORAL_TABLET | Freq: Every day | ORAL | 11 refills | Status: DC

## 2022-07-13 MED ORDER — ATORVASTATIN CALCIUM 80 MG PO TABS
80.0000 mg | ORAL_TABLET | Freq: Every day | ORAL | Status: DC
  Administered 2022-07-14: 80 mg via ORAL
  Filled 2022-07-13 (×2): qty 1

## 2022-07-13 MED ORDER — SODIUM CHLORIDE 0.9 % IV SOLN: INTRAVENOUS | Status: AC

## 2022-07-13 MED ORDER — LABETALOL HCL 5 MG/ML IV SOLN: 10.0000 mg | INTRAVENOUS | Status: AC | PRN

## 2022-07-13 MED ORDER — HYDRALAZINE HCL 20 MG/ML IJ SOLN: 10.0000 mg | INTRAMUSCULAR | Status: AC | PRN

## 2022-07-13 MED ORDER — VERAPAMIL HCL 2.5 MG/ML IV SOLN
INTRAVENOUS | Status: AC
  Filled 2022-07-13: qty 2

## 2022-07-13 MED ORDER — CLOPIDOGREL BISULFATE 75 MG PO TABS: 75.0000 mg | ORAL_TABLET | Freq: Every day | ORAL | 11 refills | Status: DC

## 2022-07-13 MED ORDER — DILTIAZEM HCL ER 120 MG PO CP24: 120.0000 mg | ORAL_CAPSULE | Freq: Every day | ORAL | Status: DC

## 2022-07-13 MED ORDER — CLOPIDOGREL BISULFATE 75 MG PO TABS
75.0000 mg | ORAL_TABLET | Freq: Every day | ORAL | Status: DC
  Administered 2022-07-14: 75 mg via ORAL
  Filled 2022-07-13: qty 1

## 2022-07-13 MED ORDER — SODIUM CHLORIDE 0.9 % IV SOLN: INTRAVENOUS | Status: DC

## 2022-07-13 SURGICAL SUPPLY — 20 items
BALL SAPPHIRE NC24 3.0X10 (BALLOONS) ×1
BALLN SAPPHIRE 2.5X12 (BALLOONS) ×1
BALLOON SAPPHIRE 2.5X12 (BALLOONS) IMPLANT
BALLOON SAPPHIRE NC24 3.0X10 (BALLOONS) IMPLANT
CATH 5FR JL3.5 JR4 ANG PIG MP (CATHETERS) IMPLANT
CATH VISTA GUIDE 6FR XBLAD3.0 (CATHETERS) IMPLANT
DEVICE RAD COMP TR BAND LRG (VASCULAR PRODUCTS) IMPLANT
GLIDESHEATH SLEND A-KIT 6F 22G (SHEATH) IMPLANT
GUIDEWIRE INQWIRE 1.5J.035X260 (WIRE) IMPLANT
GUIDEWIRE PRESSURE X 175 (WIRE) IMPLANT
INQWIRE 1.5J .035X260CM (WIRE) ×1
KIT ENCORE 26 ADVANTAGE (KITS) IMPLANT
KIT HEART LEFT (KITS) ×1 IMPLANT
PACK CARDIAC CATHETERIZATION (CUSTOM PROCEDURE TRAY) ×1 IMPLANT
SHEATH PROBE COVER 6X72 (BAG) IMPLANT
STENT SYNERGY XD 2.75X12 (Permanent Stent) IMPLANT
SYNERGY XD 2.75X12 (Permanent Stent) ×1 IMPLANT
TRANSDUCER W/STOPCOCK (MISCELLANEOUS) ×1 IMPLANT
TUBING CIL FLEX 10 FLL-RA (TUBING) ×1 IMPLANT
WIRE HI TORQ VERSACORE-J 145CM (WIRE) IMPLANT

## 2022-07-13 NOTE — Progress Notes (Signed)
TR BAND REMOVAL  LOCATION:    right radial  DEFLATED PER PROTOCOL:    Yes.    TIME BAND OFF / DRESSING APPLIED:    1355 gauze dressing applied   SITE UPON ARRIVAL:    Level 0  SITE AFTER BAND REMOVAL:    Level 0  CIRCULATION SENSATION AND MOVEMENT:    Within Normal Limits   Yes.    COMMENTS:   no issues noted

## 2022-07-13 NOTE — Interval H&P Note (Signed)
Cath Lab Visit (complete for each Cath Lab visit)  Clinical Evaluation Leading to the Procedure:   ACS: No.  Non-ACS:    Anginal Classification: CCS Sims  Anti-ischemic medical therapy: Minimal Therapy (1 class of medications)  Non-Invasive Test Results: Low-risk stress test findings: cardiac mortality <1%/year  Prior CABG: No previous CABG      History and Physical Interval Note:  07/13/2022 9:18 AM  [name]  has presented today for surgery, with the diagnosis of chest pain.  The various methods of treatment have been discussed with the patient and family. After consideration of risks, benefits and other options for treatment, the patient has consented to  Procedure(s): LEFT HEART CATH AND CORONARY ANGIOGRAPHY (N/A) as a surgical intervention.  The patient's history has been reviewed, patient examined, no change in status, stable for surgery.  I have reviewed the patient's chart and labs.  Questions were answered to the patient's satisfaction.     Pamela Sims

## 2022-07-13 NOTE — Discharge Instructions (Signed)

## 2022-07-13 NOTE — Progress Notes (Addendum)
Report received from [facility];  B/P cuff changed from leg to left arm; client states that earlier had had chest pain when B/P checked left arm; now after B/P checked left arm, client c/o left arm and left chest pain 2/10 and states relieved after B/P checked; Ria Comment Roberts,NP notified of complaints

## 2022-07-13 NOTE — Progress Notes (Signed)
CARDIAC REHAB PHASE I     Post stent teaching including risk factors, site care, antiplatelet therapy importance, restrictions, heart healthy diet, exercise guidelines and CRP2 reviewed. All questions and concerns addressed. Will refer to [facility] for CRP2. Plan for home today.   [id number]  Vanessa Barbara, RN BSN 07/13/2022 1:34 PM

## 2022-07-13 NOTE — Progress Notes (Signed)
Pt seated in recliner with feet in chair per request-NAD-O2 4 LNC-pt denies CP-BP cuff to LLE-reported that pt c/o CP when BP taken on LUE-MAR reads order for atorvastatin for 11am-pt states she took med this am-med is not on med list in epic-pt requested family to bring her med list from her purse

## 2022-07-13 NOTE — Progress Notes (Signed)
C/O 3/10 chest discomfort after walking to the bathroom; O2 started at 4l/min via nasal cannula

## 2022-07-13 NOTE — Progress Notes (Signed)
    Spoke with patient, she has continued to have intermittent episodes of chest discomfort since PCI. Repeat EKG without ischemic changes. Initially planned for same day PCI but given ongoing discomfort will keep overnight for observation. Resume home medications.   SignedReino Bellis, NP-C 07/13/2022, 3:32 PM Pager: [phone]

## 2022-07-13 NOTE — CV Procedure (Signed)
85% proximal LAD treated with a 12 x 2.75 Synergy postdilated to 3.0 with a 12 mm balloon at 15 atm.  Less than 10% stenosis noted post stent.  Mid LAD has moderate diffuse disease but RFR was 0.91 post treatment of the proximal lesion. RFR prior to stent 0.86. Other coronaries with irregularities but no high-grade obstruction. Normal LV function.

## 2022-07-14 ENCOUNTER — Encounter (HOSPITAL_COMMUNITY): Payer: Self-pay | Admitting: Interventional Cardiology

## 2022-07-14 ENCOUNTER — Telehealth: Payer: Self-pay

## 2022-07-14 DIAGNOSIS — Z955 Presence of coronary angioplasty implant and graft: Secondary | ICD-10-CM

## 2022-07-14 DIAGNOSIS — I25118 Atherosclerotic heart disease of native coronary artery with other forms of angina pectoris: Secondary | ICD-10-CM

## 2022-07-14 DIAGNOSIS — E782 Mixed hyperlipidemia: Secondary | ICD-10-CM

## 2022-07-14 DIAGNOSIS — I10 Essential (primary) hypertension: Secondary | ICD-10-CM

## 2022-07-14 DIAGNOSIS — I209 Angina pectoris, unspecified: Secondary | ICD-10-CM | POA: Diagnosis not present

## 2022-07-14 DIAGNOSIS — I25119 Atherosclerotic heart disease of native coronary artery with unspecified angina pectoris: Secondary | ICD-10-CM | POA: Diagnosis not present

## 2022-07-14 LAB — POCT ACTIVATED CLOTTING TIME
Activated Clotting Time: 329 seconds
Activated Clotting Time: 377 seconds

## 2022-07-14 MED ORDER — IRBESARTAN 150 MG PO TABS
150.0000 mg | ORAL_TABLET | Freq: Every day | ORAL | Status: DC
  Administered 2022-07-14: 150 mg via ORAL
  Filled 2022-07-14: qty 1

## 2022-07-14 MED ORDER — IRBESARTAN 150 MG PO TABS: 150.0000 mg | ORAL_TABLET | Freq: Every day | ORAL | 11 refills | Status: DC

## 2022-07-14 NOTE — Progress Notes (Signed)
Mobility Specialist - Progress Note   07/14/22 1012  Mobility  Activity Ambulated with assistance in hallway  Level of Assistance Standby assist, set-up cues, supervision of patient - no hands on  Assistive Device None  Distance Ambulated (ft) 500 ft  Activity Response Tolerated well  Mobility Referral Yes  $Mobility charge 1 Mobility    During mobility:90 HR Post-mobility:72 HR  Pt received in bed and agreeable to mobility. Pt c/o slight SOB towards the end of ambulation. Pt was returned to EOB with all needs met.   Larey Seat

## 2022-07-14 NOTE — Discharge Summary (Signed)
Discharge Summary    Patient ID: Pamela Sims MRN: [id number]; DOB: 01-16-1951  Admit date: 07/13/2022 Discharge date: 07/14/2022  PCP:  Marrian Salvage, [place] [facility] Cardiologist:  Berniece Salines, DO     Discharge Diagnoses    Principal Problem:   Angina pectoris [facility]) Active Problems:   Hyperlipidemia   Chest wall pain, chronic   Hypertension   Coronary artery calcification seen on CT scan   Obesity (BMI 30-39.9)   Mixed hyperlipidemia   OSA (obstructive sleep apnea)   CAD (coronary artery disease)  Diagnostic Studies/Procedures    Cath: 07/13/22  CONCLUSIONS: 85% proximal LAD with RFR 0.86, treated with 12 mm x 2.75 Synergy postdilated to 3.0 mm in diameter at high pressure to less than 10% stenosis. LAD beyond the stent contains moderate diffuse disease up to 50%.  RFR post stenting 0.92. Normal left main Widely patent dominant circumflex 60% nondominant right coronary Normal LV function and normal LVEDP EF 55%   RECOMMENDATIONS: Same-day discharge Aspirin and Plavix dual antiplatelet therapy for 12 months. Aggressive antilipid therapy.   Diagnostic Dominance: Left  Intervention   _____________   History of Present Illness     Pamela Sims is a 71 y.o. female with with a hx of coronary  artery disease seen on coronary CTA-moderate stenosis, hypertension, hyperlipidemia who is followed by Dr. Harriet Masson as an outpatient.    Seen back in November 12, 2020 at that time she complained of some chest discomfort as well as had had elevated blood pressure.  At that time it was recommended she undergo a coronary CTA and an echocardiogram. Hydrochlorothiazide was added to her medication regimen.   December 21, 2020 when she was experiencing some chest discomfort given her coronary artery disease and Imdur 30 mg was added to her regimen.  The patient stopped the Imdur in the interim reporting that she had significant headache.   April 01, 2021 she was hypertensive and her ramipril was increased to 10 mg daily. Also continued on ASA and statin   Jan 24, 2022 she at that time was complaining of intermittent chest discomfort she also reported that she had been diagnosed with esophagus neuropathy.  Given the fact that she does have nonobstructive coronary artery disease she was resumed on Imdur.  During her visit she was also recovering from bronchitis.  Her blood pressure was elevated that day but no additional antihypertensive was added given the fact that antianginals Imdur was being started and could affect her blood pressure.  At her most recent visit she was experiencing some chest discomfort and her Imdur was increased to 60 mg daily.  Set up for outpatient cardiac cath.   Hospital Course   Underwent cardiac catheterization noted above with 85% proximal LAD stenosis with RFR 0.86 treated with PCI/DES x1.  LAD beyond the stent with moderate diffuse disease about 50%.  She was started on DAPT with aspirin/Plavix with recommendations for at least 1 year.  Initially planned for same-day discharge but continued to have episodes of intermittent chest discomfort.  Follow-up EKGs remained unchanged.  She was admitted overnight for observation.  She reports not tolerating valsartan.  Willing to transition to a different ARB, therefore will start irbesartan 150 mg daily continue diltiazem and HCTZ at discharge.  No recurrent chest pain overnight.  Did the patient have an acute coronary syndrome (MI, NSTEMI, STEMI, etc) this admission?:  No  Did the patient have a percutaneous coronary intervention (stent / angioplasty)?:  Yes.     Cath/PCI Registry Performance & Quality Measures: Aspirin prescribed? - Yes ADP Receptor Inhibitor (Plavix/Clopidogrel, Brilinta/Ticagrelor or Effient/Prasugrel) prescribed (includes medically managed patients)? - Yes High Intensity Statin (Lipitor 40-79m or Crestor 20-475m  prescribed? - Yes For EF <40%, was ACEI/ARB prescribed? - Not Applicable (EF >/= 4041%For EF <40%, Aldosterone Antagonist (Spironolactone or Eplerenone) prescribed? - Not Applicable (EF >/= 4074%Cardiac Rehab Phase II ordered? - Yes     The patient will be scheduled for a TOC follow up appointment in 10-14 days.  [facility] at the office where the patient should be seen for follow up.  _____________  Discharge Vitals Blood pressure (!) 179/74, pulse 64, temperature 98 F (36.7 C), temperature source Oral, resp. rate 14, height '5\' 6"'  (1.676 m), weight 99 kg, SpO2 99 %.  Filed Weights   07/13/22 0637 07/13/22 2052  Weight: 97.5 kg 99 kg    Labs & Radiologic Studies    CBC No results for input(s): "WBC", "NEUTROABS", "HGB", "HCT", "MCV", "PLT" in the last 72 hours. Basic Metabolic Panel No results for input(s): "NA", "K", "CL", "CO2", "GLUCOSE", "BUN", "CREATININE", "CALCIUM", "MG", "PHOS" in the last 72 hours. Liver Function Tests No results for input(s): "AST", "ALT", "ALKPHOS", "BILITOT", "PROT", "ALBUMIN" in the last 72 hours. No results for input(s): "LIPASE", "AMYLASE" in the last 72 hours. High Sensitivity Troponin:   No results for input(s): "TROPONINIHS" in the last 720 hours.  BNP Invalid input(s): "POCBNP" D-Dimer No results for input(s): "DDIMER" in the last 72 hours. Hemoglobin A1C No results for input(s): "HGBA1C" in the last 72 hours. Fasting Lipid Panel No results for input(s): "CHOL", "HDL", "LDLCALC", "TRIG", "CHOLHDL", "LDLDIRECT" in the last 72 hours. Thyroid Function Tests No results for input(s): "TSH", "T4TOTAL", "T3FREE", "THYROIDAB" in the last 72 hours.  Invalid input(s): "FREET3" _____________  CARDIAC CATHETERIZATION  Result Date: 07/13/2022 CONCLUSIONS: 85% proximal LAD with RFR 0.86, treated with 12 mm x 2.75 Synergy postdilated to 3.0 mm in diameter at high pressure to less than 10% stenosis. LAD  beyond the stent contains moderate diffuse disease up to 50%.  RFR post stenting 0.92. Normal left main Widely patent dominant circumflex 60% nondominant right coronary Normal LV function and normal LVEDP EF 55% RECOMMENDATIONS: Same-day discharge Aspirin and Plavix dual antiplatelet therapy for 12 months. Aggressive antilipid therapy.   Disposition   Pt is being discharged home today in good condition.  Follow-up Plans & Appointments     Follow-up Information     GoDarreld McleanPA-C Follow up on 07/22/2022.   Specialties: PhLibrarian, academicCardiology Why: at 2:20pm for your follow up appt Contact information: [address] [phone]              Discharge Instructions     Amb Referral to Cardiac Rehabilitation   Complete by: As directed    Diagnosis: Coronary Stents   After initial evaluation and assessments completed: Virtual Based Care may be provided alone or in conjunction with Phase 2 Cardiac Rehab based on patient barriers.: Yes   Intensive Cardiac Rehabilitation (ICR) [facility] only OR Traditional Cardiac Rehabilitation (TCR) *If criteria for ICR are not met will enroll in TCR [facility] Yes   Diet - low sodium heart healthy   Complete by: As directed    Discharge instructions  Complete by: As directed    Radial Site Care Refer to this sheet in the next few weeks. These instructions provide you with information on caring for yourself after your procedure. Your caregiver may also give you more specific instructions. Your treatment has been planned according to current medical practices, but problems sometimes occur. Call your caregiver if you have any problems or questions after your procedure. HOME CARE INSTRUCTIONS You may shower the day after the procedure. Remove the bandage (dressing) and gently wash the site with plain soap and water. Gently pat the site dry.  Do not apply powder or lotion to the site.  Do not  submerge the affected site in water for 3 to 5 days.  Inspect the site at least twice daily.  Do not flex or bend the affected arm for 24 hours.  No lifting over 5 pounds (2.3 kg) for 5 days after your procedure.  Do not drive home if you are discharged the same day of the procedure. Have someone else drive you.  You may drive 24 hours after the procedure unless otherwise instructed by your caregiver.  What to expect: Any bruising will usually fade within 1 to 2 weeks.  Blood that collects in the tissue (hematoma) may be painful to the touch. It should usually decrease in size and tenderness within 1 to 2 weeks.  SEEK IMMEDIATE MEDICAL CARE IF: You have unusual pain at the radial site.  You have redness, warmth, swelling, or pain at the radial site.  You have drainage (other than a small amount of blood on the dressing).  You have chills.  You have a fever or persistent symptoms for more than 72 hours.  You have a fever and your symptoms suddenly get worse.  Your arm becomes pale, cool, tingly, or numb.  You have heavy bleeding from the site. Hold pressure on the site.   PLEASE DO NOT MISS ANY DOSES OF YOUR PLAVIX!!!!! Also keep a log of you blood pressures and bring back to your follow up appt. Please call the office with any questions.   Patients taking blood thinners should generally stay away from medicines like ibuprofen, Advil, Motrin, naproxen, and Aleve due to risk of stomach bleeding. You may take Tylenol as directed or talk to your primary doctor about alternatives.   PLEASE ENSURE THAT YOU DO NOT RUN OUT OF YOUR PLAVIX. This medication is very important to remain on for at least one year. IF you have issues obtaining this medication due to cost please CALL the office 3-5 business days prior to running out in order to prevent missing doses of this medication.   Increase activity slowly   Complete by: As directed         Discharge Medications   Allergies as of 07/14/2022        Reactions   Adhesive [tape] Swelling, Rash   BANDAID         Medication List     STOP taking these medications    isosorbide mononitrate 60 MG 24 hr tablet Commonly known as: IMDUR   valsartan 80 MG tablet Commonly known as: Diovan       TAKE these medications    aspirin EC 81 MG tablet Take 1 tablet by mouth daily.   atorvastatin 40 MG tablet Commonly known as: Lipitor Take 1 tablet (40 mg total) by mouth daily. Notes to patient: Continue using your home supply of 25m tablets except take two tablets (413mtotal) daily  cholecalciferol 25 MCG (1000 UNIT) tablet Commonly known as: VITAMIN D3 Take 1,000 Units by mouth daily.   clopidogrel 75 MG tablet Commonly known as: Plavix Take 1 tablet (75 mg total) by mouth daily.   cyanocobalamin 1000 MCG tablet Take 1 tablet by mouth daily.   diltiazem 120 MG 24 hr capsule Commonly known as: DILACOR XR TAKE ONE CAPSULE BY MOUTH DAILY   escitalopram 10 MG tablet Commonly known as: LEXAPRO Take 10 mg by mouth at bedtime.   famotidine 40 MG tablet Commonly known as: PEPCID Take 1 tablet (40 mg total) by mouth daily. What changed: when to take this   hydrochlorothiazide 25 MG tablet Commonly known as: HYDRODIURIL Take 1 tablet (25 mg total) by mouth daily.   irbesartan 150 MG tablet Commonly known as: AVAPRO Take 1 tablet (150 mg total) by mouth daily.   multivitamin with minerals tablet Take 1 tablet by mouth daily.   nitroGLYCERIN 0.4 MG SL tablet Commonly known as: Nitrostat Place 1 tablet (0.4 mg total) under the tongue every 5 (five) minutes as needed for chest pain.   pantoprazole 40 MG tablet Commonly known as: PROTONIX Take 40 mg by mouth 2 (two) times daily.           Outstanding Labs/Studies   N/a   Duration of Discharge Encounter   Greater than 30 minutes including physician time.  Signed, Reino Bellis, NP 07/14/2022, 10:55 AM

## 2022-07-14 NOTE — Progress Notes (Signed)
CARDIAC REHAB PHASE I      Pt preparing for discharge home. She is feeling well this morning and ambulated with mobility team. Tolerated walk well. Reviewed home education provided yesterday. All questions and concerns addressed.   [id number]     Vanessa Barbara, RN BSN 07/14/2022 11:51 AM

## 2022-07-14 NOTE — Telephone Encounter (Signed)
Routed clearance request recommendations to [facility].

## 2022-07-14 NOTE — Telephone Encounter (Signed)
   Pre-operative Risk Assessment    Patient Name: Pamela Sims  DOB: 28-Sep-1950 MRN: [id number]      Request for Surgical Clearance    Procedure:   Left open carpal Tunnel   Date of Surgery:  Clearance TBD                                 Surgeon:  Dr Roseanne Kaufman Surgeon's Group or Practice Name:  [facility] Phone number:  [phone] Fax number:  [phone]   Type of Clearance Requested:   - Medical  Please instruct on Asprin as well as giving cardio clearance   Type of Anesthesia:  General    Additional requests/questions:  Please fax a copy of Cardio clearance fax [phone] to the surgeon's office.  Signed, Jeanmarie Plant Sarina Robleto  CCMA 07/14/2022, 1:36 PM

## 2022-07-14 NOTE — Telephone Encounter (Signed)
Preoperative team, Ms. Rhyne underwent left heart catheterization yesterday and received PCI with stent placement.  She would not be eligible to hold dual antiplatelet therapy until 07/14/2023.  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  Please reach out to the requesting office and let them know.  Thank you for your help.   Jossie Ng. Amaryah Mallen NP-C     07/14/2022, 3:36 PM [place] [address] Office [phone] Fax [phone]

## 2022-07-15 ENCOUNTER — Telehealth (HOSPITAL_COMMUNITY): Payer: Self-pay

## 2022-07-15 ENCOUNTER — Telehealth: Payer: Self-pay | Admitting: Cardiology

## 2022-07-15 MED FILL — Famotidine in NaCl 0.9% IV Soln 20 MG/50ML: INTRAVENOUS | Qty: 50 | Status: AC

## 2022-07-15 MED FILL — Nitroglycerin IV Soln 100 MCG/ML in D5W: INTRA_ARTERIAL | Qty: 10 | Status: AC

## 2022-07-15 NOTE — Telephone Encounter (Signed)
Pt called questioning if she should continue isosorbide and when should she take irbesartan as she use to take her valsartan at night.  Pt made aware that discharge summary indicated to stop isosorbide and ok to take irbesartan at night but to continue to check BP. Pt verbalized understanding.
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
## 2022-07-15 NOTE — Telephone Encounter (Signed)
Pt c/o medication issue:  1. Name of Medication:  irbesartan (AVAPRO) 150 MG tablet  2. How are you currently taking this medication (dosage and times per day)?   3. Are you having a reaction (difficulty breathing--STAT)?   4. What is your medication issue?   Patient would like to know if she should be taking Irbesartan with other BP medications. She would also like to know if she needs to continue on Isosorbide. Please advise.

## 2022-07-15 NOTE — Telephone Encounter (Signed)
Per phase I cardiac rehab, fax referral to [facility] cardiac rehab.

## 2022-07-20 ENCOUNTER — Emergency Department (HOSPITAL_BASED_OUTPATIENT_CLINIC_OR_DEPARTMENT_OTHER): Payer: Medicare PPO

## 2022-07-20 ENCOUNTER — Encounter (HOSPITAL_BASED_OUTPATIENT_CLINIC_OR_DEPARTMENT_OTHER): Payer: Self-pay | Admitting: Urology

## 2022-07-20 ENCOUNTER — Emergency Department (HOSPITAL_BASED_OUTPATIENT_CLINIC_OR_DEPARTMENT_OTHER)
Admission: EM | Admit: 2022-07-20 | Discharge: 2022-07-21 | Disposition: A | Discharge status: Home / Self Care | Payer: Medicare PPO | Attending: Emergency Medicine | Admitting: Emergency Medicine

## 2022-07-20 DIAGNOSIS — Z7982 Long term (current) use of aspirin: Secondary | ICD-10-CM | POA: Diagnosis not present

## 2022-07-20 DIAGNOSIS — R079 Chest pain, unspecified: Secondary | ICD-10-CM | POA: Diagnosis not present

## 2022-07-20 DIAGNOSIS — Z79899 Other long term (current) drug therapy: Secondary | ICD-10-CM | POA: Diagnosis not present

## 2022-07-20 DIAGNOSIS — I10 Essential (primary) hypertension: Secondary | ICD-10-CM | POA: Insufficient documentation

## 2022-07-20 DIAGNOSIS — M1612 Unilateral primary osteoarthritis, left hip: Secondary | ICD-10-CM | POA: Diagnosis not present

## 2022-07-20 DIAGNOSIS — M25552 Pain in left hip: Secondary | ICD-10-CM | POA: Insufficient documentation

## 2022-07-20 DIAGNOSIS — M79605 Pain in left leg: Secondary | ICD-10-CM | POA: Diagnosis not present

## 2022-07-20 DIAGNOSIS — R0602 Shortness of breath: Secondary | ICD-10-CM | POA: Diagnosis not present

## 2022-07-20 NOTE — ED Triage Notes (Signed)
Pt states Had heart CATH Wednesday and had stent placed States left hip sore since then  States went to get out of car today and had a hard time, was unable to lift left leg up

## 2022-07-21 ENCOUNTER — Emergency Department (HOSPITAL_BASED_OUTPATIENT_CLINIC_OR_DEPARTMENT_OTHER): Payer: Medicare PPO

## 2022-07-21 ENCOUNTER — Other Ambulatory Visit: Payer: Self-pay

## 2022-07-21 ENCOUNTER — Encounter (HOSPITAL_BASED_OUTPATIENT_CLINIC_OR_DEPARTMENT_OTHER): Payer: Self-pay | Admitting: Radiology

## 2022-07-21 DIAGNOSIS — M1612 Unilateral primary osteoarthritis, left hip: Secondary | ICD-10-CM | POA: Diagnosis not present

## 2022-07-21 DIAGNOSIS — R079 Chest pain, unspecified: Secondary | ICD-10-CM | POA: Diagnosis not present

## 2022-07-21 DIAGNOSIS — R0602 Shortness of breath: Secondary | ICD-10-CM | POA: Diagnosis not present

## 2022-07-21 LAB — CBC WITH DIFFERENTIAL/PLATELET
Abs Immature Granulocytes: 0.01 10*3/uL (ref 0.00–0.07)
Basophils Absolute: 0.1 10*3/uL (ref 0.0–0.1)
Basophils Relative: 1 %
Eosinophils Absolute: 0.1 10*3/uL (ref 0.0–0.5)
Eosinophils Relative: 1 %
HCT: 40.2 % (ref 36.0–46.0)
Hemoglobin: 13.9 g/dL (ref 12.0–15.0)
Immature Granulocytes: 0 %
Lymphocytes Relative: 13 %
Lymphs Abs: 1 10*3/uL (ref 0.7–4.0)
MCH: 29.3 pg (ref 26.0–34.0)
MCHC: 34.6 g/dL (ref 30.0–36.0)
MCV: 84.6 fL (ref 80.0–100.0)
Monocytes Absolute: 0.6 10*3/uL (ref 0.1–1.0)
Monocytes Relative: 7 %
Neutro Abs: 6 10*3/uL (ref 1.7–7.7)
Neutrophils Relative %: 78 %
Platelets: 154 10*3/uL (ref 150–400)
RBC: 4.75 MIL/uL (ref 3.87–5.11)
RDW: 13.5 % (ref 11.5–15.5)
WBC: 7.7 10*3/uL (ref 4.0–10.5)
nRBC: 0 % (ref 0.0–0.2)

## 2022-07-21 LAB — COMPREHENSIVE METABOLIC PANEL
ALT: 20 U/L (ref 0–44)
AST: 35 U/L (ref 15–41)
Albumin: 4.1 g/dL (ref 3.5–5.0)
Alkaline Phosphatase: 69 U/L (ref 38–126)
Anion gap: 8 (ref 5–15)
BUN: 18 mg/dL (ref 8–23)
CO2: 26 mmol/L (ref 22–32)
Calcium: 8.9 mg/dL (ref 8.9–10.3)
Chloride: 102 mmol/L (ref 98–111)
Creatinine, Ser: 0.8 mg/dL (ref 0.44–1.00)
GFR, Estimated: >60 mL/min (ref >60)
Glucose, Bld: 116 mg/dL — ABNORMAL HIGH (ref 70–99)
Potassium: 3.2 mmol/L — ABNORMAL LOW (ref 3.5–5.1)
Sodium: 136 mmol/L (ref 135–145)
Total Bilirubin: 1 mg/dL (ref 0.3–1.2)
Total Protein: 6.7 g/dL (ref 6.5–8.1)

## 2022-07-21 LAB — TROPONIN I (HIGH SENSITIVITY)
Troponin I (High Sensitivity): 7 ng/L (ref <18)
Troponin I (High Sensitivity): 8 ng/L (ref <18)

## 2022-07-21 LAB — D-DIMER, QUANTITATIVE: D-Dimer, Quant: 1.37 ug/mL-FEU — ABNORMAL HIGH (ref 0.00–0.50)

## 2022-07-21 LAB — LIPASE, BLOOD: Lipase: 33 U/L (ref 11–51)

## 2022-07-21 MED ORDER — IRBESARTAN 150 MG PO TABS
150.0000 mg | ORAL_TABLET | Freq: Once | ORAL | Status: AC
  Administered 2022-07-21: 150 mg via ORAL
  Filled 2022-07-21: qty 1

## 2022-07-21 MED ORDER — ACETAMINOPHEN 325 MG PO TABS
650.0000 mg | ORAL_TABLET | Freq: Once | ORAL | Status: AC
  Administered 2022-07-21: 650 mg via ORAL
  Filled 2022-07-21: qty 2

## 2022-07-21 MED ORDER — NITROGLYCERIN 0.4 MG SL SUBL
SUBLINGUAL_TABLET | SUBLINGUAL | Status: AC
  Administered 2022-07-21: 0.4 mg via SUBLINGUAL
  Filled 2022-07-21: qty 3

## 2022-07-21 MED ORDER — OXYCODONE-ACETAMINOPHEN 5-325 MG PO TABS: 2.0000 | ORAL_TABLET | ORAL | 0 refills | Status: DC | PRN

## 2022-07-21 MED ORDER — NITROGLYCERIN 0.4 MG SL SUBL
0.4000 mg | SUBLINGUAL_TABLET | SUBLINGUAL | Status: DC | PRN
  Administered 2022-07-21: 0.4 mg via SUBLINGUAL

## 2022-07-21 MED ORDER — METHOCARBAMOL 500 MG PO TABS
500.0000 mg | ORAL_TABLET | Freq: Once | ORAL | Status: AC
  Administered 2022-07-21: 500 mg via ORAL
  Filled 2022-07-21: qty 1

## 2022-07-21 MED ORDER — POTASSIUM CHLORIDE CRYS ER 20 MEQ PO TBCR
40.0000 meq | EXTENDED_RELEASE_TABLET | Freq: Once | ORAL | Status: AC
  Administered 2022-07-21: 40 meq via ORAL
  Filled 2022-07-21: qty 2

## 2022-07-21 MED ORDER — METHOCARBAMOL 500 MG PO TABS: 500.0000 mg | ORAL_TABLET | Freq: Two times a day (BID) | ORAL | 0 refills | Status: DC

## 2022-07-21 MED ORDER — HYDROCHLOROTHIAZIDE 25 MG PO TABS
25.0000 mg | ORAL_TABLET | Freq: Once | ORAL | Status: AC
  Administered 2022-07-21: 25 mg via ORAL
  Filled 2022-07-21: qty 1

## 2022-07-21 MED ORDER — IOHEXOL 350 MG/ML SOLN
100.0000 mL | Freq: Once | INTRAVENOUS | Status: AC | PRN
  Administered 2022-07-21: 100 mL via INTRAVENOUS

## 2022-07-21 MED ORDER — OXYCODONE HCL 5 MG PO TABS
10.0000 mg | ORAL_TABLET | Freq: Once | ORAL | Status: AC
  Administered 2022-07-21: 10 mg via ORAL
  Filled 2022-07-21: qty 2

## 2022-07-21 NOTE — ED Notes (Signed)
Pt states chest pressure is going back up   Pt states pain is at a 8/10 now

## 2022-07-21 NOTE — ED Provider Notes (Signed)
[facility] EMERGENCY DEPARTMENT Provider Note   CSN: [id number] Arrival date & time: 07/20/22  2242     History  Chief Complaint  Patient presents with   Leg Pain    Pamela Sims is a 71 y.o. female.  71 year old female who presents the ER today secondary to left hip pain.  Patient states that she first felt pain there when she was moving from a chair to the operating table in the Cath Lab.  She states that when she stood up to get on the table she had a sharp twinge of pain in her left anterior hip.  She got her catheterization was doing fine after that.  She was doing okay [place]  She states that she has to use a wheelchair because she has deconditioning, neuropathy and left foot drop.  She states when she is getting from her wheelchair into her car she had recurrent pain.  She was able to get into the car but hurt every time she flexed her left hip.  She was able to get to her next destination and when she got the car it started to hurt again.  She made it through that day but then today it hurt again trying to get out of the car to the point that she can get out of car secondary to pain.  She called the paramedics and even with their help she could not get out of the car.  She states that it feels more comfortable when held in a flexed and slightly across her body.  She drove here because it hurt too much to get out of the car with the paramedics.  She is never any that this before.  No trauma.  No history of any injuries to that hip in the past.   Leg Pain      Home Medications Prior to Admission medications   Medication Sig Start Date End Date Taking? Authorizing Provider  aspirin 81 MG EC tablet Take 1 tablet by mouth daily.    [provider]  atorvastatin (LIPITOR) 40 MG tablet Take 1 tablet (40 mg total) by mouth daily. 07/13/22 07/13/23  Belva Crome, MD  cholecalciferol (VITAMIN D3) 25 MCG (1000 UT) tablet Take 1,000  Units by mouth daily.    [provider]  clopidogrel (PLAVIX) 75 MG tablet Take 1 tablet (75 mg total) by mouth daily. 07/13/22 07/13/23  Belva Crome, MD  cyanocobalamin 1000 MCG tablet Take 1 tablet by mouth daily.    [provider]  diltiazem (DILACOR XR) 120 MG 24 hr capsule TAKE ONE CAPSULE BY MOUTH DAILY 05/02/22   Tobb, Kardie, DO  escitalopram (LEXAPRO) 10 MG tablet Take 10 mg by mouth at bedtime.    [provider]  famotidine (PEPCID) 40 MG tablet Take 1 tablet (40 mg total) by mouth daily. Patient taking differently: Take 40 mg by mouth at bedtime. 04/01/22   Marrian Salvage, FNP  hydrochlorothiazide (HYDRODIURIL) 25 MG tablet Take 1 tablet (25 mg total) by mouth daily. 01/21/22   Tobb, Kardie, DO  irbesartan (AVAPRO) 150 MG tablet Take 1 tablet (150 mg total) by mouth daily. 07/14/22   Belva Crome, MD  Multiple Vitamins-Minerals (MULTIVITAMIN WITH MINERALS) tablet Take 1 tablet by mouth daily.    [provider]  nitroGLYCERIN (NITROSTAT) 0.4 MG SL tablet Place 1 tablet (0.4 mg total) under the tongue every 5 (five) minutes as needed for chest  pain. 07/13/22 07/13/23  Belva Crome, MD  pantoprazole (PROTONIX) 40 MG tablet Take 40 mg by mouth 2 (two) times daily. 03/28/22   [provider]      Allergies    Adhesive [tape]    Review of Systems   Review of Systems  Physical Exam Updated Vital Signs BP (!) 185/78 (BP Location: Right Arm)   Pulse 63   Temp 97.8 F (36.6 C)   Resp 18   Ht '5\' 6"'$  (1.676 m)   Wt 97.5 kg   SpO2 95%   BMI 34.70 kg/m  Physical Exam Vitals and nursing note reviewed.  Constitutional:      Appearance: She is well-developed.  HENT:     Head: Normocephalic and atraumatic.  Eyes:     Pupils: Pupils are equal, round, and reactive to light.  Cardiovascular:     Rate and Rhythm: Normal rate and regular rhythm.  Pulmonary:     Effort: No respiratory distress.     Breath sounds: No stridor.   Abdominal:     General: Abdomen is flat. There is no distension.  Musculoskeletal:        General: Tenderness (Over her left hip flexor.  worse with active range of motion.) present. Normal range of motion.     Cervical back: Normal range of motion.  Skin:    General: Skin is warm and dry.  Neurological:     General: No focal deficit present.     Mental Status: She is alert.     ED Results / Procedures / Treatments   Labs (all labs ordered are listed, but only abnormal results are displayed) Labs Reviewed - No data to display  EKG None  Radiology DG Hip Unilat W or Wo Pelvis 2-3 Views Left  Result Date: 07/20/2022 CLINICAL DATA:  Left hip pain. EXAM: DG HIP (WITH OR WITHOUT PELVIS) 2-3V LEFT COMPARISON:  None Available. FINDINGS: There is no evidence of an acute hip fracture or dislocation. Mild degenerative changes are seen involving the left hip, in the form of joint space narrowing and acetabular sclerosis. Degenerative changes are also seen within the visualized portion of the lower lumbar spine. IMPRESSION: Mild degenerative changes without evidence of an acute osseous abnormality. Electronically Signed   By: Virgina Norfolk M.D.   On: 07/20/2022 23:24    Procedures Procedures    Medications Ordered in ED Medications  acetaminophen (TYLENOL) tablet 650 mg (650 mg Oral Given 07/21/22 0022)    ED Course/ Medical Decision Making/ A&P                           Medical Decision Making Amount and/or Complexity of Data Reviewed Labs: ordered. Radiology: ordered.  Risk OTC drugs. Prescription drug management.  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  Could be just osteoarthritis but also concern for possible avascular necrosis.  We will get a CT scan.  Overall I suspect likely hip flexor sprain and may need prolonged physical therapy.  Offered pain medication and muscle lectures however she  states it does not work for her and actually caused her to be up all night and she prefers not to take them. Patient pain still present, requesting stronger pain medication.   Patient now stating she is having chest pressure with sob. No nausea or diaphoresis. Had recent stent placed. ECG has artifact but is reassuring  for no e/o STEMI. Will check d dimer, troponin, cxr and basic labs. NTG. Could very well be rlated to BP causing chest discomfort but NTG should help that tooo.   Pain improved with NTG. D dimer elevated, will await renal function but probably needs PE scan.   First troponin reassuring. Pain gone when BP improved. Leg pain also seems improved.   Still pending ct results and troponin at time of checkout. Suspect she can go home with already established PCP/PT support.   Final Clinical Impression(s) / ED Diagnoses Final diagnoses:  None    Rx / DC Orders ED Discharge Orders     None         Sarahanne Novakowski, Corene Cornea, MD 07/21/22 2316

## 2022-07-21 NOTE — ED Notes (Signed)
ED Provider at bedside. 

## 2022-07-21 NOTE — ED Notes (Signed)
Pt is c/o chest pain and pressure  States she had a heart cath with stent placement last week  Pt placed on monitor and EKG performed  Pt is also c/o leg cramping  EDP aware

## 2022-07-21 NOTE — ED Provider Notes (Signed)
Patient's delta troponins are negative.  CT PE is negative for acute findings.  Findings discussed with the patient.  At this time there is no indication for admission and she is stable for discharge home.   Blanchie Dessert, MD 07/21/22 1026

## 2022-07-21 NOTE — ED Notes (Signed)
Patient transported to CT 

## 2022-07-22 ENCOUNTER — Telehealth: Payer: Self-pay | Admitting: Student

## 2022-07-22 ENCOUNTER — Ambulatory Visit: Payer: Medicare PPO | Attending: Student | Admitting: Student

## 2022-07-22 ENCOUNTER — Encounter: Payer: Self-pay | Admitting: Student

## 2022-07-22 VITALS — BP 112/62 | HR 76 | Ht 66.0 in | Wt 217.6 lb

## 2022-07-22 DIAGNOSIS — E785 Hyperlipidemia, unspecified: Secondary | ICD-10-CM

## 2022-07-22 DIAGNOSIS — R079 Chest pain, unspecified: Secondary | ICD-10-CM | POA: Diagnosis not present

## 2022-07-22 DIAGNOSIS — I10 Essential (primary) hypertension: Secondary | ICD-10-CM

## 2022-07-22 DIAGNOSIS — I25119 Atherosclerotic heart disease of native coronary artery with unspecified angina pectoris: Secondary | ICD-10-CM

## 2022-07-22 DIAGNOSIS — G4733 Obstructive sleep apnea (adult) (pediatric): Secondary | ICD-10-CM

## 2022-07-22 DIAGNOSIS — I2510 Atherosclerotic heart disease of native coronary artery without angina pectoris: Secondary | ICD-10-CM

## 2022-07-22 DIAGNOSIS — R0683 Snoring: Secondary | ICD-10-CM

## 2022-07-22 DIAGNOSIS — E876 Hypokalemia: Secondary | ICD-10-CM

## 2022-07-22 MED ORDER — ISOSORBIDE MONONITRATE ER 30 MG PO TB24: 30.0000 mg | ORAL_TABLET | Freq: Every day | ORAL | 3 refills | Status: DC

## 2022-07-22 NOTE — Patient Instructions (Signed)
Medication Instructions:  RESTART Imdur 30 mg once daily (call us when you need a refill)  *If you need a refill on your cardiac medications before your next appointment, please call your pharmacy*   Lab Work: Your provider would like for you to have the following labs today: BMET  If you have labs (blood work) drawn today and your tests are completely normal, you will receive your results only by: [facility] (if you have MyChart) OR A paper copy in the mail If you have any lab test that is abnormal or we need to change your treatment, we will call you to review the results.   Testing/Procedures: We have reordered the sleep study for one in the sleep clinic   Follow-Up: At [facility], you and your health needs are our priority.  As part of our continuing mission to provide you with exceptional heart care, we have created designated Provider Care Teams.  These Care Teams include your primary Cardiologist (physician) and Advanced Practice Providers (APPs -  Physician Assistants and Nurse Practitioners) who all work together to provide you with the care you need, when you need it.  We recommend signing up for the patient portal called "MyChart".  Sign up information is provided on this After Visit Summary.  MyChart is used to connect with patients for Virtual Visits (Telemedicine).  Patients are able to view lab/test results, encounter notes, upcoming appointments, etc.  Non-urgent messages can be sent to your provider as well.   To learn more about what you can do with MyChart, go to [email or web address].    Your next appointment:   2 month(s)  The format for your next appointment:   In Person  Provider:   Sande Rives, PA

## 2022-07-22 NOTE — Telephone Encounter (Signed)
Pt calling because she forgot to ask at her appt if she can get her covid and flu shot and also if she is still on any restrictions or can she resume to her normal self.

## 2022-07-23 ENCOUNTER — Encounter: Payer: Self-pay | Admitting: Student

## 2022-07-23 LAB — BASIC METABOLIC PANEL
BUN/Creatinine Ratio: 22 (ref 12–28)
BUN: 20 mg/dL (ref 8–27)
CO2: 26 mmol/L (ref 20–29)
Calcium: 9.2 mg/dL (ref 8.7–10.3)
Chloride: 99 mmol/L (ref 96–106)
Creatinine, Ser: 0.92 mg/dL (ref 0.57–1.00)
Glucose: 80 mg/dL (ref 70–99)
Potassium: 4 mmol/L (ref 3.5–5.2)
Sodium: 140 mmol/L (ref 134–144)
eGFR: 67 mL/min/{1.73_m2} (ref >59)

## 2022-07-25 DIAGNOSIS — M1612 Unilateral primary osteoarthritis, left hip: Secondary | ICD-10-CM | POA: Diagnosis not present

## 2022-07-25 NOTE — Telephone Encounter (Signed)
Yes, she can get the COVID and flu shots from a cardiac standpoint. And yes, she can return to usual activities without any restrictions.  Thank you!

## 2022-07-25 NOTE — Telephone Encounter (Signed)
Pt informed of providers result & recommendations. Pt verbalized understanding. No further questions . ? ?

## 2022-08-01 ENCOUNTER — Telehealth: Payer: Self-pay | Admitting: Cardiology

## 2022-08-01 NOTE — Telephone Encounter (Signed)
Gave patient her potassium results and notation from [facility], PA-C.b"Your labs look good. Your potassium is back to normal. Continue with plan discussed at visit on Friday." Patient stated she is still having legs cramps in both legs and will make appointment with PCP. She also requested to deactivate her MyChart account. She does not use it (done).

## 2022-08-01 NOTE — Telephone Encounter (Signed)
Patient would like to discuss lab results and noted she is still having low potassium.

## 2022-08-02 DIAGNOSIS — F909 Attention-deficit hyperactivity disorder, unspecified type: Secondary | ICD-10-CM | POA: Diagnosis not present

## 2022-08-02 DIAGNOSIS — F3341 Major depressive disorder, recurrent, in partial remission: Secondary | ICD-10-CM | POA: Diagnosis not present

## 2022-08-02 DIAGNOSIS — F419 Anxiety disorder, unspecified: Secondary | ICD-10-CM | POA: Diagnosis not present

## 2022-08-04 ENCOUNTER — Telehealth: Payer: Self-pay | Admitting: *Deleted

## 2022-08-04 NOTE — Telephone Encounter (Signed)
Prior Authorization for split night sleep study sent to [facility] via web portal. Approval Number [id number]. Valid dates 08/22/22 to 11/20/22.

## 2022-08-05 ENCOUNTER — Telehealth: Payer: Self-pay | Admitting: *Deleted

## 2022-08-05 NOTE — Telephone Encounter (Signed)
Per notation from [place] [facility] with [facility] sleep lab patient was contacted to schedule sleep study. She declined saying she doesn't know why this was ordered. Ordering provider will be notified.

## 2022-08-05 NOTE — Telephone Encounter (Signed)
Patient has since called back to schedule the sleep study.

## 2022-08-10 DIAGNOSIS — M5451 Vertebrogenic low back pain: Secondary | ICD-10-CM | POA: Diagnosis not present

## 2022-08-15 ENCOUNTER — Encounter (HOSPITAL_BASED_OUTPATIENT_CLINIC_OR_DEPARTMENT_OTHER): Payer: Medicare PPO | Admitting: Cardiology

## 2022-08-17 DIAGNOSIS — M5451 Vertebrogenic low back pain: Secondary | ICD-10-CM | POA: Diagnosis not present

## 2022-08-17 DIAGNOSIS — M5432 Sciatica, left side: Secondary | ICD-10-CM | POA: Diagnosis not present

## 2022-08-24 DIAGNOSIS — M5432 Sciatica, left side: Secondary | ICD-10-CM | POA: Diagnosis not present

## 2022-08-31 ENCOUNTER — Ambulatory Visit (HOSPITAL_BASED_OUTPATIENT_CLINIC_OR_DEPARTMENT_OTHER): Payer: Medicare PPO | Attending: Student | Admitting: Cardiology

## 2022-08-31 DIAGNOSIS — G4736 Sleep related hypoventilation in conditions classified elsewhere: Secondary | ICD-10-CM | POA: Insufficient documentation

## 2022-08-31 DIAGNOSIS — G4733 Obstructive sleep apnea (adult) (pediatric): Secondary | ICD-10-CM | POA: Diagnosis not present

## 2022-09-01 NOTE — Procedures (Signed)
   Patient Name: Pamela Sims, Skaff Date: 08/31/2022 Gender: Female D.O.B: April 21, 1951 Age (years): 7 Referring Provider: Godfrey Pick Tobb DO Height (inches): 67 Interpreting Physician: Fransico Him MD, ABSM Weight (lbs): 215 RPSGT: Laren Everts BMI: 34 MRN: [id number] Neck Size: 14.00  CLINICAL INFORMATION Sleep Study Type: NPSG  Indication for sleep study: Fatigue, Hypertension, Obesity, OSA  Epworth Sleepiness Score: 6  SLEEP STUDY TECHNIQUE As per the AASM Manual for the Scoring of Sleep and Associated Events v2.3 (April 2016) with a hypopnea requiring 4% desaturations.  The channels recorded and monitored were frontal, central and occipital EEG, electrooculogram (EOG), submentalis EMG (chin), nasal and oral airflow, thoracic and abdominal wall motion, anterior tibialis EMG, snore microphone, electrocardiogram, and pulse oximetry.  MEDICATIONS Medications self-administered by patient taken the night of the study : N/A  SLEEP ARCHITECTURE The study was initiated at 10:23:43 PM and ended at 4:55:11 AM.  Sleep onset time was 47.6 minutes and the sleep efficiency was 77.0%. The total sleep time was 301.5 minutes.  Stage REM latency was 233.0 minutes.  The patient spent 11.8% of the night in stage N1 sleep, 82.4% in stage N2 sleep, 0.0% in stage N3 and 5.8% in REM.  Alpha intrusion was absent.  Supine sleep was 55.05%.  RESPIRATORY PARAMETERS The overall apnea/hypopnea index (AHI) was 12.9 per hour. There were 7 total apneas, including 1 obstructive, 6 central and 0 mixed apneas. There were 58 hypopneas and 49 RERAs.  The AHI during Stage REM sleep was 41.1 per hour.  AHI while supine was 17.0 per hour.  The mean oxygen saturation was 91.8%. The minimum SpO2 during sleep was 85.0%.  soft snoring was noted during this study.  CARDIAC DATA The 2 lead EKG demonstrated sinus rhythm. The mean heart rate was 57.3 beats per minute. Other EKG findings include:  None.  LEG MOVEMENT DATA The total PLMS were 0 with a resulting PLMS index of 0.0. Associated arousal with leg movement index was 0.0 .  IMPRESSIONS - Mild obstructive sleep apnea occurred during this study (AHI = 12.9/h). - No significant central sleep apnea occurred during this study (CAI = 1.2/h). - Mild oxygen desaturation was noted during this study (Min O2 = 85.0%). - The patient snored with soft snoring volume. - No cardiac abnormalities were noted during this study. - Clinically significant periodic limb movements did not occur during sleep. No significant associated arousals.  DIAGNOSIS - Obstructive Sleep Apnea (G47.33) - Nocturnal Hypoxemia (G47.36)  RECOMMENDATIONS - Therapeutic CPAP titration to determine optimal pressure required to alleviate sleep disordered breathing. - Avoid alcohol, sedatives and other CNS depressants that may worsen sleep apnea and disrupt normal sleep architecture. - Sleep hygiene should be reviewed to assess factors that may improve sleep quality. - Weight management and regular exercise should be initiated or continued if appropriate.  [Electronically signed] 09/01/2022 07:57 AM  Fransico Him MD, ABSM Diplomate, American Board of Sleep Medicine

## 2022-09-05 ENCOUNTER — Ambulatory Visit: Payer: Medicare PPO | Admitting: Family Medicine

## 2022-09-06 ENCOUNTER — Encounter: Payer: Self-pay | Admitting: Family

## 2022-09-06 ENCOUNTER — Ambulatory Visit (INDEPENDENT_AMBULATORY_CARE_PROVIDER_SITE_OTHER): Payer: Medicare PPO | Admitting: Family

## 2022-09-06 VITALS — BP 118/74 | HR 76 | Temp 99.2°F | Resp 18 | Ht 67.0 in | Wt 219.0 lb

## 2022-09-06 DIAGNOSIS — R9389 Abnormal findings on diagnostic imaging of other specified body structures: Secondary | ICD-10-CM | POA: Diagnosis not present

## 2022-09-06 DIAGNOSIS — J209 Acute bronchitis, unspecified: Secondary | ICD-10-CM

## 2022-09-06 MED ORDER — PREDNISONE 20 MG PO TABS: 20.0000 mg | ORAL_TABLET | Freq: Every day | ORAL | 0 refills | Status: DC

## 2022-09-06 MED ORDER — AMOXICILLIN-POT CLAVULANATE 875-125 MG PO TABS: 1.0000 | ORAL_TABLET | Freq: Two times a day (BID) | ORAL | 0 refills | Status: AC
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
## 2022-09-06 NOTE — Patient Instructions (Signed)
We will be in touch once we get the CT results;

## 2022-09-08 ENCOUNTER — Telehealth: Payer: Self-pay | Admitting: Family

## 2022-09-08 ENCOUNTER — Other Ambulatory Visit: Payer: Self-pay | Admitting: Family

## 2022-09-08 MED ORDER — TOBRAMYCIN 0.3 % OP SOLN: 1.0000 [drp] | Freq: Four times a day (QID) | OPHTHALMIC | 0 refills | Status: DC

## 2022-09-08 NOTE — Telephone Encounter (Signed)
Patient called to advise that she woke up this morning with her left eye swollen and runny. She was just seen on 12/12. She would like to know if something can be called in for her to:   [id number] - [place], [address], [place] [address] Phone: [phone]  Fax: [phone]

## 2022-09-15 ENCOUNTER — Ambulatory Visit: Payer: Medicare PPO | Attending: Cardiology | Admitting: Cardiology

## 2022-09-15 ENCOUNTER — Encounter: Payer: Self-pay | Admitting: Cardiology

## 2022-09-15 VITALS — BP 122/60 | HR 75 | Ht 66.0 in | Wt 215.4 lb

## 2022-09-15 DIAGNOSIS — E782 Mixed hyperlipidemia: Secondary | ICD-10-CM

## 2022-09-15 DIAGNOSIS — E669 Obesity, unspecified: Secondary | ICD-10-CM | POA: Diagnosis not present

## 2022-09-15 DIAGNOSIS — G4733 Obstructive sleep apnea (adult) (pediatric): Secondary | ICD-10-CM | POA: Diagnosis not present

## 2022-09-15 DIAGNOSIS — I10 Essential (primary) hypertension: Secondary | ICD-10-CM | POA: Diagnosis not present

## 2022-09-15 DIAGNOSIS — I2510 Atherosclerotic heart disease of native coronary artery without angina pectoris: Secondary | ICD-10-CM | POA: Diagnosis not present

## 2022-09-15 NOTE — Patient Instructions (Signed)
Medication Instructions:  Your physician has recommended you make the following change in your medication:  STOP: Imdur  *If you need a refill on your cardiac medications before your next appointment, please call your pharmacy*   Lab Work: NONE If you have labs (blood work) drawn today and your tests are completely normal, you will receive your results only by: [place] (if you have MyChart) OR A paper copy in the mail If you have any lab test that is abnormal or we need to change your treatment, we will call you to review the results.   Testing/Procedures: NONE   Follow-Up: At [facility], you and your health needs are our priority.  As part of our continuing mission to provide you with exceptional heart care, we have created designated Provider Care Teams.  These Care Teams include your primary Cardiologist (physician) and Advanced Practice Providers (APPs -  Physician Assistants and Nurse Practitioners) who all work together to provide you with the care you need, when you need it.  We recommend signing up for the patient portal called "MyChart".  Sign up information is provided on this After Visit Summary.  MyChart is used to connect with patients for Virtual Visits (Telemedicine).  Patients are able to view lab/test results, encounter notes, upcoming appointments, etc.  Non-urgent messages can be sent to your provider as well.   To learn more about what you can do with MyChart, go to [email or web address].    Your next appointment:   6 month(s)  The format for your next appointment:   In Person  Provider:   Berniece Salines, DO
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
## 2022-09-20 ENCOUNTER — Encounter: Payer: Self-pay | Admitting: Family Medicine

## 2022-09-20 ENCOUNTER — Ambulatory Visit (INDEPENDENT_AMBULATORY_CARE_PROVIDER_SITE_OTHER): Payer: Medicare PPO | Admitting: Family Medicine

## 2022-09-20 VITALS — BP 146/72 | HR 94 | Temp 98.4°F | Resp 16 | Wt 219.0 lb

## 2022-09-20 DIAGNOSIS — R059 Cough, unspecified: Secondary | ICD-10-CM | POA: Diagnosis not present

## 2022-09-20 DIAGNOSIS — I10 Essential (primary) hypertension: Secondary | ICD-10-CM | POA: Diagnosis not present

## 2022-09-20 MED ORDER — AZITHROMYCIN 250 MG PO TABS: ORAL_TABLET | ORAL | 0 refills | Status: DC

## 2022-09-20 MED ORDER — PROMETHAZINE-DM 6.25-15 MG/5ML PO SYRP: 5.0000 mL | ORAL_SOLUTION | Freq: Four times a day (QID) | ORAL | 0 refills | Status: DC | PRN
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
## 2022-09-20 NOTE — Patient Instructions (Signed)
Continue to push fluids, practice good hand hygiene, and cover your mouth if you cough.  If you start having fevers, shaking or shortness of breath, seek immediate care.  OK to take Tylenol 1000 mg (2 extra strength tabs) or 975 mg (3 regular strength tabs) every 6 hours as needed.  Send me a message if we are not turning the corner in the next few days.  Let us know if you need anything.

## 2022-09-23 ENCOUNTER — Other Ambulatory Visit: Payer: Self-pay | Admitting: Family

## 2022-09-23 ENCOUNTER — Telehealth: Payer: Self-pay | Admitting: *Deleted

## 2022-09-23 NOTE — Telephone Encounter (Signed)
-----   Message from Lauralee Evener, [place] sent at 09/07/2022  2:30 PM EST -----  ----- Message ----- From: Sueanne Margarita, MD Sent: 09/01/2022   7:58 AM EST To: Cv Div Sleep Studies  Please let patient know that they have sleep apnea.  Recommend therapeutic CPAP titration for treatment of patient's sleep disordered breathing.  If unable to perform an in lab titration then initiate ResMed auto CPAP from 4 to 15cm H2O with heated humidity and mask of choice and overnight pulse ox on CPAP.

## 2022-09-29 NOTE — Telephone Encounter (Signed)
09/29/22 LVM to schedule appt with Dr. Radford Pax for sleep study results - LCN

## 2022-10-03 ENCOUNTER — Encounter: Payer: Self-pay | Admitting: Family

## 2022-10-03 ENCOUNTER — Ambulatory Visit: Payer: Medicare PPO | Admitting: Family

## 2022-10-03 VITALS — BP 133/74 | HR 82 | Temp 98.6°F | Resp 16 | Wt 219.0 lb

## 2022-10-03 DIAGNOSIS — N3001 Acute cystitis with hematuria: Secondary | ICD-10-CM | POA: Diagnosis not present

## 2022-10-03 DIAGNOSIS — R3911 Hesitancy of micturition: Secondary | ICD-10-CM

## 2022-10-03 DIAGNOSIS — N3000 Acute cystitis without hematuria: Secondary | ICD-10-CM | POA: Insufficient documentation

## 2022-10-03 LAB — POC URINALSYSI DIPSTICK (AUTOMATED)
Glucose, UA: NEGATIVE
Nitrite, UA: POSITIVE
Protein, UA: POSITIVE — AB
Spec Grav, UA: 1.01 (ref 1.010–1.025)
Urobilinogen, UA: 4 E.U./dL — AB
pH, UA: 7.5 (ref 5.0–8.0)

## 2022-10-03 MED ORDER — CEPHALEXIN 500 MG PO CAPS: 500.0000 mg | ORAL_CAPSULE | Freq: Four times a day (QID) | ORAL | 0 refills | Status: AC

## 2022-10-03 NOTE — Assessment & Plan Note (Signed)
New.  UA + Nitrites, leuks, blood.  Will send for culture and begin empiric keflex.  Pt is advised to go to the ER if she develops inability to urinate and to call if symptoms are not improved in 2-3 days. Pt verbalizes understanding.
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
## 2022-10-05 LAB — URINE CULTURE
MICRO NUMBER:: 14401426
SPECIMEN QUALITY:: ADEQUATE

## 2022-10-13 ENCOUNTER — Ambulatory Visit: Payer: Medicare PPO | Attending: Cardiology | Admitting: Cardiology

## 2022-10-13 ENCOUNTER — Encounter: Payer: Self-pay | Admitting: Cardiology

## 2022-10-13 VITALS — BP 138/70 | HR 75 | Ht 66.0 in | Wt 223.0 lb

## 2022-10-13 DIAGNOSIS — G5622 Lesion of ulnar nerve, left upper limb: Secondary | ICD-10-CM | POA: Diagnosis not present

## 2022-10-13 DIAGNOSIS — I10 Essential (primary) hypertension: Secondary | ICD-10-CM

## 2022-10-13 DIAGNOSIS — M13842 Other specified arthritis, left hand: Secondary | ICD-10-CM | POA: Diagnosis not present

## 2022-10-13 DIAGNOSIS — G5602 Carpal tunnel syndrome, left upper limb: Secondary | ICD-10-CM | POA: Diagnosis not present

## 2022-10-13 DIAGNOSIS — G4733 Obstructive sleep apnea (adult) (pediatric): Secondary | ICD-10-CM

## 2022-10-13 DIAGNOSIS — M67432 Ganglion, left wrist: Secondary | ICD-10-CM | POA: Diagnosis not present

## 2022-10-13 DIAGNOSIS — M79642 Pain in left hand: Secondary | ICD-10-CM | POA: Diagnosis not present
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
## 2022-10-13 NOTE — Patient Instructions (Addendum)
Medication Instructions:  Your physician recommends that you continue on your current medications as directed. Please refer to the Current Medication list given to you today.  *If you need a refill on your cardiac medications before your next appointment, please call your pharmacy*   Lab Work: None. If you have labs (blood work) drawn today and your tests are completely normal, you will receive your results only by: [place] (if you have MyChart) OR A paper copy in the mail If you have any lab test that is abnormal or we need to change your treatment, we will call you to review the results.   Testing/Procedures: None.   Follow-Up: At [facility], you and your health needs are our priority.  As part of our continuing mission to provide you with exceptional heart care, we have created designated Provider Care Teams.  These Care Teams include your primary Cardiologist (physician) and Advanced Practice Providers (APPs -  Physician Assistants and Nurse Practitioners) who all work together to provide you with the care you need, when you need it.  We recommend signing up for the patient portal called "MyChart".  Sign up information is provided on this After Visit Summary.  MyChart is used to connect with patients for Virtual Visits (Telemedicine).  Patients are able to view lab/test results, encounter notes, upcoming appointments, etc.  Non-urgent messages can be sent to your provider as well.   To learn more about what you can do with MyChart, go to [email or web address].    Your next appointment will be as needed with:   Dr. Fransico Him, MD   Other Instructions You have been referred to see the ENT you saw previously. Please follow up with her to address your sleep apnea.  Marrian Salvage, [address] [place], [place] [address]  Phone: [phone]  Fax: [phone]

## 2022-10-18 ENCOUNTER — Ambulatory Visit: Payer: Medicare PPO | Admitting: Family

## 2022-10-18 ENCOUNTER — Encounter: Payer: Self-pay | Admitting: Family

## 2022-10-18 VITALS — BP 131/81 | HR 77 | Temp 97.8°F | Ht 66.0 in | Wt 220.4 lb

## 2022-10-18 DIAGNOSIS — R221 Localized swelling, mass and lump, neck: Secondary | ICD-10-CM | POA: Diagnosis not present

## 2022-10-18 DIAGNOSIS — R3915 Urgency of urination: Secondary | ICD-10-CM

## 2022-10-18 LAB — POC URINALSYSI DIPSTICK (AUTOMATED)
Bilirubin, UA: NEGATIVE
Blood, UA: NEGATIVE
Glucose, UA: NEGATIVE
Ketones, UA: NEGATIVE
Nitrite, UA: NEGATIVE
Protein, UA: NEGATIVE
Spec Grav, UA: 1.015 (ref 1.010–1.025)
Urobilinogen, UA: 0.2 E.U./dL
pH, UA: 6 (ref 5.0–8.0)

## 2022-10-18 NOTE — Patient Instructions (Signed)
  Please call your ENT to schedule a follow up;   [facility]  [place], [place] [address]  [phone] (Work)
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
## 2022-10-20 ENCOUNTER — Other Ambulatory Visit: Payer: Self-pay | Admitting: Family

## 2022-10-20 ENCOUNTER — Telehealth (HOSPITAL_BASED_OUTPATIENT_CLINIC_OR_DEPARTMENT_OTHER): Payer: Self-pay

## 2022-10-20 LAB — URINE CULTURE
MICRO NUMBER:: 14460712
SPECIMEN QUALITY:: ADEQUATE

## 2022-10-20 MED ORDER — NITROFURANTOIN MONOHYD MACRO 100 MG PO CAPS: 100.0000 mg | ORAL_CAPSULE | Freq: Two times a day (BID) | ORAL | 0 refills | Status: DC

## 2022-10-25 ENCOUNTER — Ambulatory Visit: Payer: Medicare PPO | Admitting: Cardiology

## 2022-10-31 ENCOUNTER — Other Ambulatory Visit: Payer: Self-pay

## 2022-10-31 ENCOUNTER — Encounter: Payer: Self-pay | Admitting: Internal Medicine

## 2022-10-31 ENCOUNTER — Ambulatory Visit: Payer: Medicare PPO | Admitting: Internal Medicine

## 2022-10-31 VITALS — BP 116/86 | HR 88 | Temp 98.6°F | Resp 18 | Ht 66.0 in | Wt 217.1 lb

## 2022-10-31 DIAGNOSIS — F3341 Major depressive disorder, recurrent, in partial remission: Secondary | ICD-10-CM | POA: Diagnosis not present

## 2022-10-31 DIAGNOSIS — U071 COVID-19: Secondary | ICD-10-CM

## 2022-10-31 DIAGNOSIS — F419 Anxiety disorder, unspecified: Secondary | ICD-10-CM | POA: Diagnosis not present

## 2022-10-31 DIAGNOSIS — N390 Urinary tract infection, site not specified: Secondary | ICD-10-CM

## 2022-10-31 DIAGNOSIS — F909 Attention-deficit hyperactivity disorder, unspecified type: Secondary | ICD-10-CM | POA: Diagnosis not present

## 2022-10-31 LAB — POC COVID19 BINAXNOW: SARS Coronavirus 2 Ag: POSITIVE — AB

## 2022-10-31 MED ORDER — ALBUTEROL SULFATE HFA 108 (90 BASE) MCG/ACT IN AERS: 2.0000 | INHALATION_SPRAY | Freq: Four times a day (QID) | RESPIRATORY_TRACT | 2 refills | Status: DC | PRN

## 2022-10-31 MED ORDER — NIRMATRELVIR/RITONAVIR (PAXLOVID)TABLET: 3.0000 | ORAL_TABLET | Freq: Two times a day (BID) | ORAL | 0 refills | Status: AC

## 2022-10-31 MED ORDER — BENZONATATE 200 MG PO CAPS: 200.0000 mg | ORAL_CAPSULE | Freq: Three times a day (TID) | ORAL | 0 refills | Status: DC | PRN

## 2022-10-31 NOTE — Patient Instructions (Addendum)
  Rest, fluids , tylenol  Take Paxlovid, the antiviral for the next 5 days.  Hold atorvastatin x 6-7 days   For cough:  Take Mucinex DM or Robitussin-DM OTC.  Follow the instructions in the box. In addition take Tessalon Perles (benzonatate) 3 times a day.  If nasal congestion: -Use over-the-counter Flonase: 2 nasal sprays on each side of the nose in the morning until you feel better  Avoid decongestants such as  Pseudoephedrine or phenylephrine   Albuterol as needed for wheezing.  Call if not gradually better over the next  10 days  Call anytime if the symptoms are severe, you have high fever, short of breath, chest pain  You will be contagious for the next few days.  Take precautions.  Proceed with a vaccine in 4 weeks from today
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
## 2022-11-16 DIAGNOSIS — B351 Tinea unguium: Secondary | ICD-10-CM | POA: Diagnosis not present

## 2022-11-16 DIAGNOSIS — M19272 Secondary osteoarthritis, left ankle and foot: Secondary | ICD-10-CM | POA: Diagnosis not present

## 2022-11-16 DIAGNOSIS — M2022 Hallux rigidus, left foot: Secondary | ICD-10-CM | POA: Diagnosis not present

## 2022-12-08 ENCOUNTER — Ambulatory Visit (HOSPITAL_BASED_OUTPATIENT_CLINIC_OR_DEPARTMENT_OTHER)
Admission: RE | Admit: 2022-12-08 | Discharge: 2022-12-08 | Disposition: A | Discharge status: Home / Self Care | Payer: Medicare PPO | Source: Ambulatory Visit | Attending: Family | Admitting: Family

## 2022-12-08 ENCOUNTER — Ambulatory Visit: Payer: Medicare PPO | Admitting: Family

## 2022-12-08 ENCOUNTER — Encounter: Payer: Self-pay | Admitting: Family

## 2022-12-08 VITALS — BP 134/78 | HR 82 | Resp 18 | Ht 66.0 in | Wt 220.0 lb

## 2022-12-08 DIAGNOSIS — M791 Myalgia, unspecified site: Secondary | ICD-10-CM

## 2022-12-08 DIAGNOSIS — R9389 Abnormal findings on diagnostic imaging of other specified body structures: Secondary | ICD-10-CM | POA: Insufficient documentation

## 2022-12-08 DIAGNOSIS — N390 Urinary tract infection, site not specified: Secondary | ICD-10-CM | POA: Diagnosis not present

## 2022-12-08 DIAGNOSIS — I700 Atherosclerosis of aorta: Secondary | ICD-10-CM | POA: Diagnosis not present

## 2022-12-08 DIAGNOSIS — R079 Chest pain, unspecified: Secondary | ICD-10-CM | POA: Diagnosis not present

## 2022-12-08 LAB — POC URINALSYSI DIPSTICK (AUTOMATED)
Bilirubin, UA: NEGATIVE
Blood, UA: NEGATIVE
Glucose, UA: NEGATIVE
Ketones, UA: NEGATIVE
Nitrite, UA: NEGATIVE
Protein, UA: NEGATIVE
Spec Grav, UA: 1.01 (ref 1.010–1.025)
Urobilinogen, UA: 0.2 E.U./dL
pH, UA: 6 (ref 5.0–8.0)

## 2022-12-08 MED ORDER — NITROFURANTOIN MONOHYD MACRO 100 MG PO CAPS: 100.0000 mg | ORAL_CAPSULE | Freq: Two times a day (BID) | ORAL | 0 refills | Status: DC
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
## 2022-12-08 NOTE — Patient Instructions (Addendum)
Please hold the Atorvastatin for now and let's see if the body aches/ pains improve/ resolve; Let me hear back from you in about 2-3 weeks;

## 2022-12-11 LAB — URINE CULTURE
MICRO NUMBER:: 14693068
SPECIMEN QUALITY:: ADEQUATE

## 2022-12-20 ENCOUNTER — Telehealth: Payer: Self-pay | Admitting: Family

## 2022-12-20 NOTE — Telephone Encounter (Signed)
Pt states [facility] are [facility] booked until the summer and her back pain is coming back. She would like to know what to do.

## 2022-12-27 ENCOUNTER — Telehealth: Payer: Self-pay | Admitting: Cardiology

## 2022-12-27 ENCOUNTER — Other Ambulatory Visit: Payer: Self-pay | Admitting: Family

## 2022-12-27 DIAGNOSIS — N390 Urinary tract infection, site not specified: Secondary | ICD-10-CM

## 2022-12-27 NOTE — Telephone Encounter (Signed)
Spoke to patient she stated she has been having chest pain and pressure,frequent burping for the past 1 month.She saw PCP who ordered  chest ct.PCP advised her to see Dr.Tobb.Appointment scheduled with Dr.Tobb 4/3 at 10:30 am.

## 2022-12-27 NOTE — Telephone Encounter (Signed)
Patient states that her PCP forwarded over CT results and states they suggested sooner f/u appt. Pt is requesting call back to see if she needs to be seen sooner than the appt she was able to schedule. Please advise.

## 2022-12-27 NOTE — Telephone Encounter (Signed)
Patient states she mentioned the back pain because she is concerned that it has to do with her bladder issues. Patient is mainly worried about not being able to see a urogyn until July. Patient would like to know if there is any place that will be able to see her sooner because she does not want to wait any longer. Please advise.

## 2022-12-28 ENCOUNTER — Encounter: Payer: Self-pay | Admitting: Cardiology

## 2022-12-28 ENCOUNTER — Ambulatory Visit: Payer: Medicare PPO | Attending: Cardiology | Admitting: Cardiology

## 2022-12-28 VITALS — BP 138/72 | HR 78 | Ht 66.0 in | Wt 223.0 lb

## 2022-12-28 DIAGNOSIS — I10 Essential (primary) hypertension: Secondary | ICD-10-CM

## 2022-12-28 DIAGNOSIS — E782 Mixed hyperlipidemia: Secondary | ICD-10-CM

## 2022-12-28 DIAGNOSIS — I2510 Atherosclerotic heart disease of native coronary artery without angina pectoris: Secondary | ICD-10-CM | POA: Diagnosis not present

## 2022-12-28 DIAGNOSIS — E669 Obesity, unspecified: Secondary | ICD-10-CM

## 2022-12-28 DIAGNOSIS — Z79899 Other long term (current) drug therapy: Secondary | ICD-10-CM

## 2022-12-28 DIAGNOSIS — G4733 Obstructive sleep apnea (adult) (pediatric): Secondary | ICD-10-CM

## 2022-12-28 MED ORDER — ROSUVASTATIN CALCIUM 10 MG PO TABS: 10.0000 mg | ORAL_TABLET | Freq: Every day | ORAL | 3 refills | Status: DC

## 2022-12-28 MED ORDER — ISOSORBIDE MONONITRATE ER 30 MG PO TB24: 30.0000 mg | ORAL_TABLET | Freq: Every day | ORAL | 3 refills | Status: DC

## 2022-12-28 MED ORDER — ROSUVASTATIN CALCIUM 10 MG PO TABS: 10.0000 mg | ORAL_TABLET | Freq: Every evening | ORAL | 3 refills | Status: DC
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
## 2022-12-28 NOTE — Patient Instructions (Signed)
Medication Instructions:  Your physician has recommended you make the following change in your medication:  START: Imdur 30 mg once daily START: Crestor 10 mg once nightly *If you need a refill on your cardiac medications before your next appointment, please call your pharmacy*   Lab Work: Your physician recommends that you have labs drawn today: CMET, Mag, Lipids If you have labs (blood work) drawn today and your tests are completely normal, you will receive your results only by: [place] (if you have MyChart) OR A paper copy in the mail If you have any lab test that is abnormal or we need to change your treatment, we will call you to review the results.   Testing/Procedures: None   Follow-Up: At [facility], you and your health needs are our priority.  As part of our continuing mission to provide you with exceptional heart care, we have created designated Provider Care Teams.  These Care Teams include your primary Cardiologist (physician) and Advanced Practice Providers (APPs -  Physician Assistants and Nurse Practitioners) who all work together to provide you with the care you need, when you need it.  We recommend signing up for the patient portal called "MyChart".  Sign up information is provided on this After Visit Summary.  MyChart is used to connect with patients for Virtual Visits (Telemedicine).  Patients are able to view lab/test results, encounter notes, upcoming appointments, etc.  Non-urgent messages can be sent to your provider as well.   To learn more about what you can do with MyChart, go to [email or web address].    Your next appointment:   9 month(s)  Provider:   Berniece Salines, DO

## 2022-12-28 NOTE — Telephone Encounter (Signed)
Spoke with pt, pt has an appointment with urologist 01/02/2023.

## 2022-12-29 LAB — LIPID PANEL
Chol/HDL Ratio: 2.9 ratio (ref 0.0–4.4)
Cholesterol, Total: 208 mg/dL — ABNORMAL HIGH (ref 100–199)
HDL: 71 mg/dL (ref >39)
LDL Chol Calc (NIH): 124 mg/dL — ABNORMAL HIGH (ref 0–99)
Triglycerides: 71 mg/dL (ref 0–149)
VLDL Cholesterol Cal: 13 mg/dL (ref 5–40)

## 2022-12-29 LAB — COMPREHENSIVE METABOLIC PANEL
ALT: 13 IU/L (ref 0–32)
AST: 18 IU/L (ref 0–40)
Albumin/Globulin Ratio: 2.2 (ref 1.2–2.2)
Albumin: 4.2 g/dL (ref 3.9–4.9)
Alkaline Phosphatase: 73 IU/L (ref 44–121)
BUN/Creatinine Ratio: 18 (ref 12–28)
BUN: 17 mg/dL (ref 8–27)
Bilirubin Total: 0.6 mg/dL (ref 0.0–1.2)
CO2: 24 mmol/L (ref 20–29)
Calcium: 9.5 mg/dL (ref 8.7–10.3)
Chloride: 102 mmol/L (ref 96–106)
Creatinine, Ser: 0.97 mg/dL (ref 0.57–1.00)
Globulin, Total: 1.9 g/dL (ref 1.5–4.5)
Glucose: 95 mg/dL (ref 70–99)
Potassium: 4.1 mmol/L (ref 3.5–5.2)
Sodium: 142 mmol/L (ref 134–144)
Total Protein: 6.1 g/dL (ref 6.0–8.5)
eGFR: 63 mL/min/{1.73_m2} (ref >59)

## 2022-12-29 LAB — MAGNESIUM: Magnesium: 1.9 mg/dL (ref 1.6–2.3)

## 2023-01-02 ENCOUNTER — Encounter: Payer: Self-pay | Admitting: Urology

## 2023-01-02 ENCOUNTER — Telehealth: Payer: Self-pay | Admitting: Cardiology

## 2023-01-02 ENCOUNTER — Ambulatory Visit: Payer: Medicare PPO | Admitting: Urology

## 2023-01-02 VITALS — BP 148/82 | HR 76 | Ht 66.0 in | Wt 217.0 lb

## 2023-01-02 DIAGNOSIS — N3946 Mixed incontinence: Secondary | ICD-10-CM | POA: Diagnosis not present

## 2023-01-02 DIAGNOSIS — N390 Urinary tract infection, site not specified: Secondary | ICD-10-CM | POA: Diagnosis not present

## 2023-01-02 LAB — MICROSCOPIC EXAMINATION
Cast Type: NONE SEEN
Casts: NONE SEEN /lpf
Crystal Type: NONE SEEN
Crystals: NONE SEEN
Trichomonas, UA: NONE SEEN
Yeast, UA: NONE SEEN

## 2023-01-02 LAB — URINALYSIS, ROUTINE W REFLEX MICROSCOPIC
Bilirubin, UA: NEGATIVE
Glucose, UA: NEGATIVE
Nitrite, UA: NEGATIVE
Specific Gravity, UA: 1.025 (ref 1.005–1.030)
Urobilinogen, Ur: 0.2 mg/dL (ref 0.2–1.0)
pH, UA: 6.5 (ref 5.0–7.5)

## 2023-01-02 MED ORDER — SULFAMETHOXAZOLE-TRIMETHOPRIM 400-80 MG PO TABS
1.0000 | ORAL_TABLET | Freq: Every evening | ORAL | 3 refills | Status: DC
Start: 2023-01-02 — End: 2023-05-25

## 2023-01-02 NOTE — Telephone Encounter (Signed)
Pt c/o medication issue:  1. Name of Medication: isosorbide mononitrate (IMDUR) 30 MG 24 hr tablet   2. How are you currently taking this medication (dosage and times per day)? Has not taken since Friday   3. Are you having a reaction (difficulty breathing--STAT)? no  4. What is your medication issue? Pt states she started this medication Thursday night and Friday her bp was 225/124 with a severe headache. She states she has not taken it since then. Please advise.

## 2023-01-02 NOTE — Telephone Encounter (Signed)
Patient states she had previous issues with Imdur and headaches, but thought it was combination of medications.  She started this again at last visit and states immediatly started having headaches again.  She stopped taking the medication Friday.. She states her BP was 225/124 Friday.  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  Within an hour and half she was feeling unsteady and had a massive H/A.  She took her BP medications at about 5:30 and layed down.  She retook BP after this and was 138/85.  She states it has been running good otherwise with highest at 158/89 and today at PCP was 138/79. Advised on the days she bakes, taking her meds earlier to prevent possibility of that high a blood pressure.  She states the medication Imdur was for palpitations not BP and her BP was never that high until she took the Imdur.  I  noted understanding but to prevent issue in future that may be attributed to increased stress and activity on those days.  She ask for any alternative medication in place of Imdur
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
## 2023-01-16 ENCOUNTER — Ambulatory Visit (INDEPENDENT_AMBULATORY_CARE_PROVIDER_SITE_OTHER): Payer: Medicare PPO | Admitting: *Deleted

## 2023-01-16 VITALS — BP 149/81 | HR 62 | Ht 66.0 in | Wt 216.0 lb

## 2023-01-16 DIAGNOSIS — Z0000 Encounter for general adult medical examination without abnormal findings: Secondary | ICD-10-CM

## 2023-01-16 NOTE — Patient Instructions (Signed)
Pamela Sims , Thank you for taking time to come for your Medicare Wellness Visit. I appreciate your ongoing commitment to your health goals. Please review the following plan we discussed and let me know if I can assist you in the future.     This is a list of the screening recommended for you and due dates:  Health Maintenance  Topic Date Due   DTaP/Tdap/Td vaccine (1 - Tdap) Never done   Zoster (Shingles) Vaccine (1 of 2) Never done   Pneumonia Vaccine (2 of 2 - PPSV23 or PCV20) 07/24/2020   COVID-19 Vaccine (5 - 2023-24 season) 05/27/2022   Flu Shot  04/27/2023   Mammogram  07/02/2023   Medicare Annual Wellness Visit  01/16/2024   Colon Cancer Screening  12/22/2029   DEXA scan (bone density measurement)  Completed   Hepatitis C Screening: USPSTF Recommendation to screen - Ages 18-79 yo.  Completed   HPV Vaccine  Aged Out    Next appointment: Follow up in one year for your annual wellness visit.   Preventive Care 26 Years and Older, Female Preventive care refers to lifestyle choices and visits with your health care provider that can promote health and wellness. What does preventive care include? A yearly physical exam. This is also called an annual well check. Dental exams once or twice a year. Routine eye exams. Ask your health care provider how often you should have your eyes checked. Personal lifestyle choices, including: Daily care of your teeth and gums. Regular physical activity. Eating a healthy diet. Avoiding tobacco and drug use. Limiting alcohol use. Practicing safe sex. Taking low-dose aspirin every day. Taking vitamin and mineral supplements as recommended by your health care provider. What happens during an annual well check? The services and screenings done by your health care provider during your annual well check will depend on your age, overall health, lifestyle risk factors, and family history of disease. Counseling  Your health care provider may ask you  questions about your: Alcohol use. Tobacco use. Drug use. Emotional well-being. Home and relationship well-being. Sexual activity. Eating habits. History of falls. Memory and ability to understand (cognition). Work and work Astronomer. Reproductive health. Screening  You may have the following tests or measurements: Height, weight, and BMI. Blood pressure. Lipid and cholesterol levels. These may be checked every 5 years, or more frequently if you are over 73 years old. Skin check. Lung cancer screening. You may have this screening every year starting at age 52 if you have a 30-pack-year history of smoking and currently smoke or have quit within the past 15 years. Fecal occult blood test (FOBT) of the stool. You may have this test every year starting at age 17. Flexible sigmoidoscopy or colonoscopy. You may have a sigmoidoscopy every 5 years or a colonoscopy every 10 years starting at age 62. Hepatitis C blood test. Hepatitis B blood test. Sexually transmitted disease (STD) testing. Diabetes screening. This is done by checking your blood sugar (glucose) after you have not eaten for a while (fasting). You may have this done every 1-3 years. Bone density scan. This is done to screen for osteoporosis. You may have this done starting at age 67. Mammogram. This may be done every 1-2 years. Talk to your health care provider about how often you should have regular mammograms. Talk with your health care provider about your test results, treatment options, and if necessary, the need for more tests. Vaccines  Your health care provider may recommend certain vaccines, such  as: Influenza vaccine. This is recommended every year. Tetanus, diphtheria, and acellular pertussis (Tdap, Td) vaccine. You may need a Td booster every 10 years. Zoster vaccine. You may need this after age 7. Pneumococcal 13-valent conjugate (PCV13) vaccine. One dose is recommended after age 41. Pneumococcal polysaccharide  (PPSV23) vaccine. One dose is recommended after age 87. Talk to your health care provider about which screenings and vaccines you need and how often you need them. This information is not intended to replace advice given to you by your health care provider. Make sure you discuss any questions you have with your health care provider. Document Released: 10/09/2015 Document Revised: 06/01/2016 Document Reviewed: 07/14/2015 Elsevier Interactive Patient Education  2017 [employer] Prevention in the Home Falls can cause injuries. They can happen to people of all ages. There are many things you can do to make your home safe and to help prevent falls. What can I do on the outside of my home? Regularly fix the edges of walkways and driveways and fix any cracks. Remove anything that might make you trip as you walk through a door, such as a raised step or threshold. Trim any bushes or trees on the path to your home. Use bright outdoor lighting. Clear any walking paths of anything that might make someone trip, such as rocks or tools. Regularly check to see if handrails are loose or broken. Make sure that both sides of any steps have handrails. Any raised decks and porches should have guardrails on the edges. Have any leaves, snow, or ice cleared regularly. Use sand or salt on walking paths during winter. Clean up any spills in your garage right away. This includes oil or grease spills. What can I do in the bathroom? Use night lights. Install grab bars by the toilet and in the tub and shower. Do not use towel bars as grab bars. Use non-skid mats or decals in the tub or shower. If you need to sit down in the shower, use a plastic, non-slip stool. Keep the floor dry. Clean up any water that spills on the floor as soon as it happens. Remove soap buildup in the tub or shower regularly. Attach bath mats securely with double-sided non-slip rug tape. Do not have throw rugs and other things on the  floor that can make you trip. What can I do in the bedroom? Use night lights. Make sure that you have a light by your bed that is easy to reach. Do not use any sheets or blankets that are too big for your bed. They should not hang down onto the floor. Have a firm chair that has side arms. You can use this for support while you get dressed. Do not have throw rugs and other things on the floor that can make you trip. What can I do in the kitchen? Clean up any spills right away. Avoid walking on wet floors. Keep items that you use a lot in easy-to-reach places. If you need to reach something above you, use a strong step stool that has a grab bar. Keep electrical cords out of the way. Do not use floor polish or wax that makes floors slippery. If you must use wax, use non-skid floor wax. Do not have throw rugs and other things on the floor that can make you trip. What can I do with my stairs? Do not leave any items on the stairs. Make sure that there are handrails on both sides of the stairs and use  them. Fix handrails that are broken or loose. Make sure that handrails are as long as the stairways. Check any carpeting to make sure that it is firmly attached to the stairs. Fix any carpet that is loose or worn. Avoid having throw rugs at the top or bottom of the stairs. If you do have throw rugs, attach them to the floor with carpet tape. Make sure that you have a light switch at the top of the stairs and the bottom of the stairs. If you do not have them, ask someone to add them for you. What else can I do to help prevent falls? Wear shoes that: Do not have high heels. Have rubber bottoms. Are comfortable and fit you well. Are closed at the toe. Do not wear sandals. If you use a stepladder: Make sure that it is fully opened. Do not climb a closed stepladder. Make sure that both sides of the stepladder are locked into place. Ask someone to hold it for you, if possible. Clearly mark and make  sure that you can see: Any grab bars or handrails. First and last steps. Where the edge of each step is. Use tools that help you move around (mobility aids) if they are needed. These include: Canes. Walkers. Scooters. Crutches. Turn on the lights when you go into a dark area. Replace any light bulbs as soon as they burn out. Set up your furniture so you have a clear path. Avoid moving your furniture around. If any of your floors are uneven, fix them. If there are any pets around you, be aware of where they are. Review your medicines with your doctor. Some medicines can make you feel dizzy. This can increase your chance of falling. Ask your doctor what other things that you can do to help prevent falls. This information is not intended to replace advice given to you by your health care provider. Make sure you discuss any questions you have with your health care provider. Document Released: 07/09/2009 Document Revised: 02/18/2016 Document Reviewed: 10/17/2014 Elsevier Interactive Patient Education  2017 [employer].
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
## 2023-01-17 ENCOUNTER — Other Ambulatory Visit: Payer: Self-pay | Admitting: Cardiology

## 2023-01-25 ENCOUNTER — Ambulatory Visit: Payer: Medicare PPO | Admitting: Physician Assistant

## 2023-01-26 DIAGNOSIS — F3341 Major depressive disorder, recurrent, in partial remission: Secondary | ICD-10-CM | POA: Diagnosis not present

## 2023-01-26 DIAGNOSIS — F419 Anxiety disorder, unspecified: Secondary | ICD-10-CM | POA: Diagnosis not present

## 2023-01-26 DIAGNOSIS — F909 Attention-deficit hyperactivity disorder, unspecified type: Secondary | ICD-10-CM | POA: Diagnosis not present

## 2023-02-08 DIAGNOSIS — N3946 Mixed incontinence: Secondary | ICD-10-CM | POA: Diagnosis not present

## 2023-02-08 DIAGNOSIS — N390 Urinary tract infection, site not specified: Secondary | ICD-10-CM | POA: Diagnosis not present

## 2023-02-09 ENCOUNTER — Ambulatory Visit: Payer: Medicare PPO | Admitting: Urology

## 2023-02-09 VITALS — BP 132/75 | HR 73

## 2023-02-09 DIAGNOSIS — N3946 Mixed incontinence: Secondary | ICD-10-CM

## 2023-02-09 DIAGNOSIS — Z87440 Personal history of urinary (tract) infections: Secondary | ICD-10-CM | POA: Diagnosis not present

## 2023-02-09 DIAGNOSIS — N390 Urinary tract infection, site not specified: Secondary | ICD-10-CM

## 2023-02-09 DIAGNOSIS — N952 Postmenopausal atrophic vaginitis: Secondary | ICD-10-CM

## 2023-02-09 LAB — URINALYSIS, ROUTINE W REFLEX MICROSCOPIC
Bilirubin, UA: NEGATIVE
Glucose, UA: NEGATIVE
Ketones, UA: NEGATIVE
Nitrite, UA: NEGATIVE
Protein,UA: NEGATIVE
Specific Gravity, UA: 1.02 (ref 1.005–1.030)
Urobilinogen, Ur: 0.2 mg/dL (ref 0.2–1.0)
pH, UA: 7 (ref 5.0–7.5)

## 2023-02-09 LAB — MICROSCOPIC EXAMINATION
Crystal Type: NONE SEEN
Crystals: NONE SEEN
Epithelial Cells (non renal): >10 /hpf — AB (ref 0–10)
Trichomonas, UA: NONE SEEN
Yeast, UA: NONE SEEN

## 2023-02-09 MED ORDER — ESTRADIOL 0.1 MG/GM VA CREA: TOPICAL_CREAM | VAGINAL | 12 refills | Status: DC

## 2023-02-09 MED ORDER — SOLIFENACIN SUCCINATE 10 MG PO TABS
10.0000 mg | ORAL_TABLET | Freq: Every day | ORAL | 3 refills | Status: DC
Start: 2023-02-09 — End: 2024-02-27
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
## 2023-02-11 LAB — URINE CULTURE

## 2023-02-13 DIAGNOSIS — J342 Deviated nasal septum: Secondary | ICD-10-CM | POA: Diagnosis not present

## 2023-02-13 DIAGNOSIS — J343 Hypertrophy of nasal turbinates: Secondary | ICD-10-CM | POA: Diagnosis not present

## 2023-02-13 DIAGNOSIS — G4733 Obstructive sleep apnea (adult) (pediatric): Secondary | ICD-10-CM | POA: Diagnosis not present

## 2023-02-14 ENCOUNTER — Ambulatory Visit: Payer: Medicare PPO | Attending: Internal Medicine | Admitting: Internal Medicine

## 2023-02-14 ENCOUNTER — Encounter: Payer: Self-pay | Admitting: Internal Medicine

## 2023-02-14 VITALS — BP 142/64 | HR 61 | Ht 66.0 in | Wt 226.2 lb

## 2023-02-14 DIAGNOSIS — R079 Chest pain, unspecified: Secondary | ICD-10-CM | POA: Diagnosis not present

## 2023-02-14 DIAGNOSIS — R233 Spontaneous ecchymoses: Secondary | ICD-10-CM

## 2023-02-14 DIAGNOSIS — I2510 Atherosclerotic heart disease of native coronary artery without angina pectoris: Secondary | ICD-10-CM

## 2023-02-14 DIAGNOSIS — E782 Mixed hyperlipidemia: Secondary | ICD-10-CM

## 2023-02-14 DIAGNOSIS — E785 Hyperlipidemia, unspecified: Secondary | ICD-10-CM
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
## 2023-02-14 NOTE — Patient Instructions (Signed)
Medication Instructions:  NO CHANGES  *If you need a refill on your cardiac medications before your next appointment, please call your pharmacy*   Lab Work: FASTING labs in 3-4 months  If you have labs (blood work) drawn today and your tests are completely normal, you will receive your results only by: MyChart Message (if you have MyChart) OR A paper copy in the mail If you have any lab test that is abnormal or we need to change your treatment, we will call you to review the results.   Follow-Up: At [facility], you and your health needs are our priority.  As part of our continuing mission to provide you with exceptional heart care, we have created designated Provider Care Teams.  These Care Teams include your primary Cardiologist (physician) and Advanced Practice Providers (APPs -  Physician Assistants and Nurse Practitioners) who all work together to provide you with the care you need, when you need it.  We recommend signing up for the patient portal called "MyChart".  Sign up information is provided on this After Visit Summary.  MyChart is used to connect with patients for Virtual Visits (Telemedicine).  Patients are able to view lab/test results, encounter notes, upcoming appointments, etc.  Non-urgent messages can be sent to your provider as well.   To learn more about what you can do with MyChart, go to [email or web address].    Your next appointment:    3-4 months with Dr. Rennis Golden

## 2023-02-15 ENCOUNTER — Ambulatory Visit: Payer: Medicare PPO | Admitting: Family Medicine

## 2023-02-15 ENCOUNTER — Encounter: Payer: Self-pay | Admitting: Family Medicine

## 2023-02-15 VITALS — BP 132/82 | HR 72 | Ht 66.0 in | Wt 223.0 lb

## 2023-02-15 DIAGNOSIS — R3129 Other microscopic hematuria: Secondary | ICD-10-CM

## 2023-02-15 DIAGNOSIS — R109 Unspecified abdominal pain: Secondary | ICD-10-CM

## 2023-02-15 LAB — CBC WITH DIFFERENTIAL/PLATELET
Basophils Absolute: 0.1 10*3/uL (ref 0.0–0.1)
Basophils Relative: 1.1 % (ref 0.0–3.0)
Eosinophils Absolute: 0.1 10*3/uL (ref 0.0–0.7)
Eosinophils Relative: 1.8 % (ref 0.0–5.0)
HCT: 40.9 % (ref 36.0–46.0)
Hemoglobin: 13.7 g/dL (ref 12.0–15.0)
Lymphocytes Relative: 13.9 % (ref 12.0–46.0)
Lymphs Abs: 0.7 10*3/uL (ref 0.7–4.0)
MCHC: 33.5 g/dL (ref 30.0–36.0)
MCV: 87.9 fl (ref 78.0–100.0)
Monocytes Absolute: 0.4 10*3/uL (ref 0.1–1.0)
Monocytes Relative: 7.9 % (ref 3.0–12.0)
Neutro Abs: 4 10*3/uL (ref 1.4–7.7)
Neutrophils Relative %: 75.3 % (ref 43.0–77.0)
Platelets: 164 10*3/uL (ref 150.0–400.0)
RBC: 4.66 Mil/uL (ref 3.87–5.11)
RDW: 14.6 % (ref 11.5–15.5)
WBC: 5.3 10*3/uL (ref 4.0–10.5)

## 2023-02-15 LAB — COMPREHENSIVE METABOLIC PANEL
ALT: 10 U/L (ref 0–35)
AST: 16 U/L (ref 0–37)
Albumin: 4.2 g/dL (ref 3.5–5.2)
Alkaline Phosphatase: 62 U/L (ref 39–117)
BUN: 14 mg/dL (ref 6–23)
CO2: 31 mEq/L (ref 19–32)
Calcium: 9.4 mg/dL (ref 8.4–10.5)
Chloride: 99 mEq/L (ref 96–112)
Creatinine, Ser: 1.18 mg/dL (ref 0.40–1.20)
GFR: 46.77 mL/min — ABNORMAL LOW (ref >60.00)
Glucose, Bld: 87 mg/dL (ref 70–99)
Potassium: 4.3 mEq/L (ref 3.5–5.1)
Sodium: 138 mEq/L (ref 135–145)
Total Bilirubin: 0.8 mg/dL (ref 0.2–1.2)
Total Protein: 6.2 g/dL (ref 6.0–8.3)

## 2023-02-15 NOTE — Patient Instructions (Addendum)
Your pain could be musculoskeletal, but given there is also some hematuria, we can try checking for a kidney stone. Updating some labs today as well. We will keep you updated with results.   Please contact office for follow-up if symptoms do not improve or worsen. Seek emergency care if symptoms become severe.

## 2023-02-15 NOTE — Progress Notes (Signed)
Acute Office Visit  Subjective:     Patient ID: Pamela Sims, female    DOB: September 25, 1951, 72 y.o.   MRN: [id number]  Chief Complaint  Patient presents with   Abdominal Pain    Patient is in today for abdominal/flank pain.  Abdominal/flank Pain: Patient complains of abdominal/flank pain. The pain is described as dull, and is  5-10 /10 in intensity. Pain is located in the right abdomen/right flank with radiation to back. Onset was 6 weeks ago (states this has happened off and on for years and she has never had a diagnosis - hx of prior cholecystectomy ~20 years ago). Symptoms have been unchanged since. Aggravating factors: movement and pressure.  Alleviating factors: none. Associated symptoms: occasional nausea, but baseline so hard to tell if related. Denies constipation, diarrhea, dysuria, fever, frequency, hematochezia, hematuria, melena. LBM today was normal.   Recently she was lying on her side and pushed really hard on the area and it was more painful and has caused the pain to be more constant and now worse with movement also.   She is following with urology (Dr. Margo Aye) for recurrent UTI. UA sample last week with them was still showing 1+ leuks and trace RBC - microscopic revealed RBC 3-10, many bacteria. They have her on prolonged course of Bactrim.       All review of systems negative except what is listed in the HPI      Objective:    BP 132/82   Pulse 72   Ht 5\' 6"  (1.676 m)   Wt 223 lb (101.2 kg)   SpO2 97%   BMI 35.99 kg/m    Physical Exam Vitals reviewed.  Constitutional:      Appearance: She is well-developed.  Cardiovascular:     Rate and Rhythm: Normal rate.  Pulmonary:     Effort: Pulmonary effort is normal.     Breath sounds: Normal breath sounds.  Abdominal:     General: Bowel sounds are normal. There is no distension.     Palpations: Abdomen is soft. There is no shifting dullness, fluid wave or mass.     Tenderness: There is no abdominal  tenderness. There is no right CVA tenderness, left CVA tenderness, guarding or rebound.  Musculoskeletal:        General: Normal range of motion.  Skin:    General: Skin is warm.  Neurological:     Mental Status: She is alert and oriented to person, place, and time.  Psychiatric:        Mood and Affect: Mood normal.        Behavior: Behavior normal.        Thought Content: Thought content normal.        Judgment: Judgment normal.     No results found for any visits on 02/15/23.      Assessment & Plan:   Problem List Items Addressed This Visit   None Visit Diagnoses     Right flank pain    -  Primary   Relevant Orders   CBC with Differential/Platelet   Comprehensive metabolic panel   CT RENAL STONE STUDY   Other microscopic hematuria       Relevant Orders   CBC with Differential/Platelet   Comprehensive metabolic panel   CT RENAL STONE STUDY     Your pain could be musculoskeletal, but given there is also some hematuria, we can try checking for a kidney stone. Updating some labs today as well. We will  keep you updated with results.   No orders of the defined types were placed in this encounter.   Return if symptoms worsen or fail to improve.  Clayborne Dana, NP

## 2023-02-19 ENCOUNTER — Ambulatory Visit (HOSPITAL_BASED_OUTPATIENT_CLINIC_OR_DEPARTMENT_OTHER)
Admission: RE | Admit: 2023-02-19 | Discharge: 2023-02-19 | Disposition: A | Discharge status: Home / Self Care | Payer: Medicare PPO | Source: Ambulatory Visit | Attending: Family Medicine | Admitting: Family Medicine

## 2023-02-19 DIAGNOSIS — K5730 Diverticulosis of large intestine without perforation or abscess without bleeding: Secondary | ICD-10-CM | POA: Diagnosis not present

## 2023-02-19 DIAGNOSIS — R109 Unspecified abdominal pain: Secondary | ICD-10-CM | POA: Diagnosis not present

## 2023-02-19 DIAGNOSIS — R3129 Other microscopic hematuria: Secondary | ICD-10-CM | POA: Insufficient documentation

## 2023-02-19 DIAGNOSIS — K449 Diaphragmatic hernia without obstruction or gangrene: Secondary | ICD-10-CM | POA: Diagnosis not present

## 2023-03-02 ENCOUNTER — Encounter: Payer: Self-pay | Admitting: Family

## 2023-03-02 ENCOUNTER — Ambulatory Visit: Payer: Medicare PPO | Admitting: Family

## 2023-03-02 VITALS — BP 138/84 | HR 67 | Ht 66.0 in | Wt 226.6 lb

## 2023-03-02 DIAGNOSIS — R5383 Other fatigue: Secondary | ICD-10-CM | POA: Diagnosis not present

## 2023-03-02 DIAGNOSIS — R300 Dysuria: Secondary | ICD-10-CM

## 2023-03-02 DIAGNOSIS — Z13220 Encounter for screening for lipoid disorders: Secondary | ICD-10-CM

## 2023-03-02 LAB — COMPREHENSIVE METABOLIC PANEL
ALT: 9 U/L (ref 0–35)
AST: 13 U/L (ref 0–37)
Albumin: 4.2 g/dL (ref 3.5–5.2)
Alkaline Phosphatase: 60 U/L (ref 39–117)
BUN: 16 mg/dL (ref 6–23)
CO2: 31 mEq/L (ref 19–32)
Calcium: 9 mg/dL (ref 8.4–10.5)
Chloride: 102 mEq/L (ref 96–112)
Creatinine, Ser: 1.14 mg/dL (ref 0.40–1.20)
GFR: 48.73 mL/min — ABNORMAL LOW (ref >60.00)
Glucose, Bld: 97 mg/dL (ref 70–99)
Potassium: 3.6 mEq/L (ref 3.5–5.1)
Sodium: 141 mEq/L (ref 135–145)
Total Bilirubin: 0.6 mg/dL (ref 0.2–1.2)
Total Protein: 6.1 g/dL (ref 6.0–8.3)

## 2023-03-02 LAB — POCT URINALYSIS DIPSTICK
Bilirubin, UA: NEGATIVE
Glucose, UA: NEGATIVE
Ketones, UA: NEGATIVE
Nitrite, UA: NEGATIVE
Protein, UA: NEGATIVE
Spec Grav, UA: 1.01 (ref 1.010–1.025)
Urobilinogen, UA: 0.2 E.U./dL
pH, UA: 7 (ref 5.0–8.0)

## 2023-03-02 LAB — LIPID PANEL
Cholesterol: 134 mg/dL (ref 0–200)
HDL: 55.3 mg/dL (ref >39.00)
LDL Cholesterol: 65 mg/dL (ref 0–99)
NonHDL: 78.92
Total CHOL/HDL Ratio: 2
Triglycerides: 71 mg/dL (ref 0.0–149.0)
VLDL: 14.2 mg/dL (ref 0.0–40.0)

## 2023-03-02 LAB — HEMOGLOBIN A1C: Hgb A1c MFr Bld: 5.2 % (ref 4.6–6.5)
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
## 2023-03-02 NOTE — Patient Instructions (Signed)
You could consider discussing an oral appliance to manage the sleep apnea-  The ENT's message indicated that deviated septum could be contributing to the sleep apnea;

## 2023-03-04 LAB — URINE CULTURE
MICRO NUMBER:: 15049788
Result:: NO GROWTH
SPECIMEN QUALITY:: ADEQUATE

## 2023-03-13 ENCOUNTER — Encounter: Payer: Self-pay | Admitting: Urology

## 2023-03-22 ENCOUNTER — Ambulatory Visit: Payer: Medicare PPO | Admitting: Cardiology

## 2023-04-10 ENCOUNTER — Ambulatory Visit: Payer: Medicare PPO

## 2023-04-10 ENCOUNTER — Ambulatory Visit: Payer: Medicare PPO | Admitting: Family

## 2023-04-10 ENCOUNTER — Telehealth: Payer: Self-pay

## 2023-04-10 VITALS — BP 111/69 | HR 67 | Temp 97.8°F | Resp 6 | Wt 225.0 lb

## 2023-04-10 DIAGNOSIS — S0990XA Unspecified injury of head, initial encounter: Secondary | ICD-10-CM

## 2023-04-10 DIAGNOSIS — S0990XD Unspecified injury of head, subsequent encounter: Secondary | ICD-10-CM | POA: Diagnosis not present

## 2023-04-10 DIAGNOSIS — R519 Headache, unspecified: Secondary | ICD-10-CM | POA: Diagnosis not present

## 2023-04-10 NOTE — Telephone Encounter (Signed)
Pt has appointment in office today.
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
## 2023-04-11 NOTE — Assessment & Plan Note (Signed)
CT head is performed and notes no acute changes or bleeding.  Suspect headache is due to mild concussion. We discussed common symptoms/course of concussion.  She is advised to call if symptoms worsen or if symptoms do not improve in the next few weeks.

## 2023-04-11 NOTE — Patient Instructions (Signed)
VISIT SUMMARY:  During your visit, we discussed your recent head injury and the persistent headache and left ear pain you've been experiencing. You also mentioned feeling nauseous on a couple of occasions. We performed a neurological exam, which came back normal. You are currently on two blood thinners, Aspirin and Plavix, which can increase the risk of bleeding inside the skull.  YOUR PLAN:  -HEAD TRAUMA: Your head injury may have caused a concussion, which is a type of brain injury that can cause headaches and nausea. We need to make sure there's no bleeding inside your skull, which can be a risk due to the blood thinners you're taking. We've ordered an urgent CT scan of your head to check for this.  -EAR PAIN: Your left ear pain could be related to your head injury. We didn't find anything abnormal when we looked inside your ear. For now, we'll keep an eye on this and reassess if your symptoms continue or get worse.  INSTRUCTIONS:  Please get the CT scan as soon as possible. If the scan doesn't show any bleeding, your symptoms are likely due to a concussion, which should improve over the next few weeks. If your ear pain continues or gets worse, please let us know.

## 2023-04-12 ENCOUNTER — Ambulatory Visit (HOSPITAL_BASED_OUTPATIENT_CLINIC_OR_DEPARTMENT_OTHER): Payer: Medicare PPO

## 2023-04-17 DIAGNOSIS — L570 Actinic keratosis: Secondary | ICD-10-CM | POA: Diagnosis not present

## 2023-05-03 ENCOUNTER — Other Ambulatory Visit: Payer: Self-pay | Admitting: Cardiology

## 2023-05-09 DIAGNOSIS — F419 Anxiety disorder, unspecified: Secondary | ICD-10-CM | POA: Diagnosis not present

## 2023-05-09 DIAGNOSIS — F909 Attention-deficit hyperactivity disorder, unspecified type: Secondary | ICD-10-CM | POA: Diagnosis not present

## 2023-05-09 DIAGNOSIS — F3341 Major depressive disorder, recurrent, in partial remission: Secondary | ICD-10-CM | POA: Diagnosis not present

## 2023-05-18 ENCOUNTER — Ambulatory Visit: Payer: Medicare PPO | Admitting: Urology

## 2023-05-25 ENCOUNTER — Ambulatory Visit: Payer: Medicare PPO | Admitting: Urology

## 2023-05-25 ENCOUNTER — Encounter: Payer: Self-pay | Admitting: Urology

## 2023-05-25 VITALS — BP 136/74 | HR 79 | Ht 66.0 in | Wt 219.0 lb

## 2023-05-25 DIAGNOSIS — N390 Urinary tract infection, site not specified: Secondary | ICD-10-CM

## 2023-05-25 DIAGNOSIS — N3281 Overactive bladder: Secondary | ICD-10-CM | POA: Diagnosis not present

## 2023-05-25 DIAGNOSIS — Z87440 Personal history of urinary (tract) infections: Secondary | ICD-10-CM

## 2023-05-25 DIAGNOSIS — N952 Postmenopausal atrophic vaginitis: Secondary | ICD-10-CM

## 2023-05-25 MED ORDER — PREMARIN 0.625 MG/GM VA CREA: TOPICAL_CREAM | VAGINAL | 12 refills | Status: DC
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
## 2023-06-02 ENCOUNTER — Ambulatory Visit: Payer: Medicare PPO | Admitting: Family

## 2023-06-02 ENCOUNTER — Encounter: Payer: Self-pay | Admitting: Family

## 2023-06-02 DIAGNOSIS — Z041 Encounter for examination and observation following transport accident: Secondary | ICD-10-CM | POA: Diagnosis not present

## 2023-06-02 LAB — MICROSCOPIC EXAMINATION

## 2023-06-02 LAB — URINALYSIS, ROUTINE W REFLEX MICROSCOPIC
Bilirubin, UA: NEGATIVE
Glucose, UA: NEGATIVE
Ketones, UA: NEGATIVE
Nitrite, UA: NEGATIVE
Protein,UA: NEGATIVE
RBC, UA: NEGATIVE
Specific Gravity, UA: 1.02 (ref 1.005–1.030)
Urobilinogen, Ur: 0.2 mg/dL (ref 0.2–1.0)
pH, UA: 7.5 (ref 5.0–7.5)
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
## 2023-08-01 ENCOUNTER — Other Ambulatory Visit: Payer: Self-pay | Admitting: Cardiology

## 2023-08-07 DIAGNOSIS — F909 Attention-deficit hyperactivity disorder, unspecified type: Secondary | ICD-10-CM | POA: Diagnosis not present

## 2023-08-07 DIAGNOSIS — F3341 Major depressive disorder, recurrent, in partial remission: Secondary | ICD-10-CM | POA: Diagnosis not present

## 2023-08-07 DIAGNOSIS — F419 Anxiety disorder, unspecified: Secondary | ICD-10-CM | POA: Diagnosis not present

## 2023-08-10 ENCOUNTER — Other Ambulatory Visit: Payer: Self-pay

## 2023-08-10 MED ORDER — CLOPIDOGREL BISULFATE 75 MG PO TABS: 75.0000 mg | ORAL_TABLET | Freq: Every day | ORAL | 1 refills | Status: DC

## 2023-08-10 MED ORDER — IRBESARTAN 150 MG PO TABS: 150.0000 mg | ORAL_TABLET | Freq: Every day | ORAL | 5 refills | Status: DC

## 2023-09-28 ENCOUNTER — Other Ambulatory Visit: Payer: Self-pay | Admitting: Family

## 2023-10-11 ENCOUNTER — Telehealth: Payer: Self-pay | Admitting: Cardiology

## 2023-10-11 DIAGNOSIS — Z860100 Personal history of colon polyps, unspecified: Secondary | ICD-10-CM | POA: Diagnosis not present

## 2023-10-11 DIAGNOSIS — Z800 Family history of malignant neoplasm of digestive organs: Secondary | ICD-10-CM | POA: Diagnosis not present

## 2023-10-11 DIAGNOSIS — K219 Gastro-esophageal reflux disease without esophagitis: Secondary | ICD-10-CM | POA: Diagnosis not present

## 2023-10-11 DIAGNOSIS — R194 Change in bowel habit: Secondary | ICD-10-CM | POA: Diagnosis not present

## 2023-10-11 DIAGNOSIS — K2270 Barrett's esophagus without dysplasia: Secondary | ICD-10-CM | POA: Diagnosis not present

## 2023-10-11 NOTE — Telephone Encounter (Signed)
   Pre-operative Risk Assessment    Patient Name: Pamela Sims  DOB: [birth year] MRN: [id number]   Date of last office visit: 02/14/2023 Date of next office visit: none   Request for Surgical Clearance    Procedure:   colonoscopy  Date of Surgery:  Clearance TBD                                Surgeon:  Dr. Murray Arnold Group or Practice Name:  [facility] Phone number:  [phone] Fax number:  [phone]   Type of Clearance Requested:   - Pharmacy:  Hold Clopidogrel  (Plavix ) hold 5 days prior to procedure   Type of Anesthesia:  Not Indicated   Additional requests/questions:    [name]   10/11/2023, 3:02 PM

## 2023-10-13 NOTE — Telephone Encounter (Signed)
Good Morning Dr. Servando Salina  We have received a surgical clearance request for Pamela Sims a colonoscopy procedure.  She has a PMH of CAD s/p PCI/DES to proximal LAD.  Based on the proximity of her stent per our protocol guidance for holding Plavix must come from her primary cardiologist.  Can you please comment on holding Plavix for 5 days prior to her scheduled colonoscopy procedure. Please forward you guidance and recommendations to [facility] PREOP   Thanks, Robin Searing, NP

## 2023-10-13 NOTE — Telephone Encounter (Signed)
   Patient Name: Pamela Sims  DOB: Sep 13, 1951 MRN: [id number]  Primary Cardiologist: Thomasene Ripple, DO  Chart reviewed as part of pre-operative protocol coverage. Given past medical history and time since last visit, based on ACC/AHA guidelines, Pamela Sims is at acceptable risk for the planned procedure without further cardiovascular testing.   The patient was advised that if she develops new symptoms prior to surgery to contact our office to arrange for a follow-up visit, and she verbalized understanding.  Patient can hold Plavix 5 days prior to procedure and should restart postprocedure when surgically safe and guided by surgical team.  I will route this recommendation to the requesting party via Epic fax function and remove from pre-op pool.  Please call with questions.  Napoleon Form, Leodis Rains, NP 10/13/2023, 9:12 AM

## 2023-10-16 DIAGNOSIS — L738 Other specified follicular disorders: Secondary | ICD-10-CM | POA: Diagnosis not present

## 2023-10-16 DIAGNOSIS — D225 Melanocytic nevi of trunk: Secondary | ICD-10-CM | POA: Diagnosis not present

## 2023-10-16 DIAGNOSIS — Z872 Personal history of diseases of the skin and subcutaneous tissue: Secondary | ICD-10-CM | POA: Diagnosis not present

## 2023-10-16 DIAGNOSIS — L821 Other seborrheic keratosis: Secondary | ICD-10-CM | POA: Diagnosis not present

## 2023-10-16 DIAGNOSIS — B351 Tinea unguium: Secondary | ICD-10-CM | POA: Diagnosis not present

## 2023-10-16 DIAGNOSIS — Z129 Encounter for screening for malignant neoplasm, site unspecified: Secondary | ICD-10-CM | POA: Diagnosis not present

## 2023-10-16 DIAGNOSIS — L720 Epidermal cyst: Secondary | ICD-10-CM | POA: Diagnosis not present

## 2023-10-30 DIAGNOSIS — F419 Anxiety disorder, unspecified: Secondary | ICD-10-CM | POA: Diagnosis not present

## 2023-10-30 DIAGNOSIS — F909 Attention-deficit hyperactivity disorder, unspecified type: Secondary | ICD-10-CM | POA: Diagnosis not present

## 2023-10-30 DIAGNOSIS — F3341 Major depressive disorder, recurrent, in partial remission: Secondary | ICD-10-CM | POA: Diagnosis not present

## 2023-11-22 ENCOUNTER — Ambulatory Visit: Payer: Self-pay | Admitting: Family

## 2023-11-22 ENCOUNTER — Telehealth: Payer: Self-pay

## 2023-11-22 ENCOUNTER — Encounter: Payer: Self-pay | Admitting: Medical

## 2023-11-22 ENCOUNTER — Ambulatory Visit: Payer: Medicare PPO | Admitting: Medical

## 2023-11-22 ENCOUNTER — Ambulatory Visit (HOSPITAL_BASED_OUTPATIENT_CLINIC_OR_DEPARTMENT_OTHER)
Admission: RE | Admit: 2023-11-22 | Discharge: 2023-11-22 | Disposition: A | Discharge status: Home / Self Care | Payer: Medicare PPO | Source: Ambulatory Visit | Attending: Medical | Admitting: Medical

## 2023-11-22 VITALS — BP 150/78 | HR 78 | Temp 98.6°F | Resp 18 | Ht 66.0 in | Wt 226.0 lb

## 2023-11-22 DIAGNOSIS — K219 Gastro-esophageal reflux disease without esophagitis: Secondary | ICD-10-CM

## 2023-11-22 DIAGNOSIS — R059 Cough, unspecified: Secondary | ICD-10-CM | POA: Diagnosis not present

## 2023-11-22 DIAGNOSIS — R0982 Postnasal drip: Secondary | ICD-10-CM | POA: Diagnosis not present

## 2023-11-22 DIAGNOSIS — R051 Acute cough: Secondary | ICD-10-CM | POA: Diagnosis not present

## 2023-11-22 LAB — POC COVID19 BINAXNOW: SARS Coronavirus 2 Ag: NEGATIVE

## 2023-11-22 LAB — POCT INFLUENZA A/B
Influenza A, POC: NEGATIVE
Influenza B, POC: NEGATIVE

## 2023-11-22 MED ORDER — FLUTICASONE PROPIONATE 50 MCG/ACT NA SUSP: 2.0000 | Freq: Every day | NASAL | 1 refills | Status: DC

## 2023-11-22 MED ORDER — AZITHROMYCIN 250 MG PO TABS: ORAL_TABLET | ORAL | 0 refills | Status: AC

## 2023-11-22 MED ORDER — BENZONATATE 100 MG PO CAPS: 100.0000 mg | ORAL_CAPSULE | Freq: Three times a day (TID) | ORAL | 0 refills | Status: DC | PRN

## 2023-11-22 NOTE — Telephone Encounter (Signed)
 Copied from CRM [id number]. Topic: Clinical - Lab/Test Results >> Nov 22, 2023 12:15 PM Almira Coaster wrote: Reason for CRM: Patient is calling regarding the flu and covid test she had done in the office today, She received a message that results were available. Please call patient to provide results as she is unable to log into Mychart. 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

## 2023-11-22 NOTE — Telephone Encounter (Signed)
 Pt calling for XR results, advised of results per provider. Pt asked if she should continue current treatment. This RN advised pt to continue treatment as prescribed. Advised message will be routed to provider for review and clinic will reach out if there are additional recommendations.  Pt also asking if she should be tested for RSV as she was negative for Flu/Covid. She reports her 2 mo old grandson has RSV, she notes she has not been around him but wanted to inquire if that is something she should be tested for.  Copied from CRM [id number]. Topic: Clinical - Lab/Test Results >> Nov 22, 2023  3:17 PM Alcus Dad wrote: Reason for CRM: Patient wants to know her xray results Reason for Disposition  Caller requesting lab results  (Exception: Routine or non-urgent lab result.)  Answer Assessment - Initial Assessment Questions 1. REASON FOR CALL or QUESTION: "What is your reason for calling today?" or "How can I best help you?" or "What question do you have that I can help answer?"     Pt calling in for XR results  Answer Assessment - Initial Assessment Questions 1. REASON FOR CALL or QUESTION: "What is your reason for calling today?" or "How can I best help you?" or "What question do you have that I can help answer?"     Pt requesting XR results 2. CALLER: Document the source of call. (e.g., laboratory, patient).     Pt  Protocols used: Information Only Call - No Triage-A-AH, PCP Call - No Triage-A-AH

## 2023-11-22 NOTE — Telephone Encounter (Signed)
Spoke with pt, pt is aware of results and expressed understanding.  

## 2023-11-22 NOTE — Patient Instructions (Signed)
 Bronchitis vs allergic cough. Pt works with adult special needs. Degree of cough causes concern for bacterial cause. Wear mask at work. covid and flu test pending. cxr today. if any + will recommend not to work pending tx/improvement. -rx azithromycin -flonase for nasal congestion. Some pnd noted on exam.  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 No fever, chills, or sweats. No relief with current management strategies. -Order chest x-ray to rule out infection or other pulmonary pathology. -Prescribe Benzonatate for cough suppression, with caution regarding potential drowsiness.  Postnasal Drip:  -Advise use of Flonase nasal spray for symptom relief.  Gastroesophageal Reflux Disease (GERD): Controlled with Famotidine and Pantoprazole. -Continue current medications.  Follow-up in 7-10 days if cough persists or worsens.
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
## 2023-11-29 ENCOUNTER — Ambulatory Visit: Payer: Medicare PPO | Admitting: Urology

## 2024-01-04 ENCOUNTER — Other Ambulatory Visit: Payer: Self-pay | Admitting: Cardiology

## 2024-01-17 ENCOUNTER — Ambulatory Visit (INDEPENDENT_AMBULATORY_CARE_PROVIDER_SITE_OTHER)

## 2024-01-17 ENCOUNTER — Other Ambulatory Visit: Payer: Self-pay | Admitting: Cardiology

## 2024-01-17 VITALS — BP 150/78 | Ht 66.0 in | Wt 218.0 lb

## 2024-01-17 DIAGNOSIS — Z5320 Procedure and treatment not carried out because of patient's decision for unspecified reasons: Secondary | ICD-10-CM

## 2024-01-17 DIAGNOSIS — Z0000 Encounter for general adult medical examination without abnormal findings: Secondary | ICD-10-CM

## 2024-01-17 DIAGNOSIS — Z2821 Immunization not carried out because of patient refusal: Secondary | ICD-10-CM | POA: Diagnosis not present

## 2024-01-17 NOTE — Patient Instructions (Signed)
 Ms. Pamela Sims , Thank you for taking time to come for your Medicare Wellness Visit. I appreciate your ongoing commitment to your health goals. Please review the following plan we discussed and let me know if I can assist you in the future.   Referrals/Orders/Follow-Ups/Clinician Recommendations: follow up   This is a list of the screening recommended for you and due dates:  Health Maintenance  Topic Date Due   DTaP/Tdap/Td vaccine (1 - Tdap) Never done   Zoster (Shingles) Vaccine (1 of 2) Never done   Pneumonia Vaccine (2 of 2 - PPSV23) 09/19/2019   COVID-19 Vaccine (5 - 2024-25 season) 05/28/2023   Mammogram  07/02/2023   Flu Shot  04/26/2024   Medicare Annual Wellness Visit  01/16/2025   Colon Cancer Screening  12/22/2029   DEXA scan (bone density measurement)  Completed   Hepatitis C Screening  Completed   HPV Vaccine  Aged Out   Meningitis B Vaccine  Aged Out    Advanced directives: (Declined) Advance directive discussed with you today. Even though you declined this today, please call our office should you change your mind, and we can give you the proper paperwork for you to fill out.  Next Medicare Annual Wellness Visit scheduled for next year: Yes
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
## 2024-01-29 DIAGNOSIS — F909 Attention-deficit hyperactivity disorder, unspecified type: Secondary | ICD-10-CM | POA: Diagnosis not present

## 2024-01-29 DIAGNOSIS — F419 Anxiety disorder, unspecified: Secondary | ICD-10-CM | POA: Diagnosis not present

## 2024-01-29 DIAGNOSIS — F330 Major depressive disorder, recurrent, mild: Secondary | ICD-10-CM | POA: Diagnosis not present

## 2024-01-31 ENCOUNTER — Other Ambulatory Visit: Payer: Self-pay | Admitting: Urology

## 2024-01-31 ENCOUNTER — Other Ambulatory Visit: Payer: Self-pay | Admitting: Cardiology

## 2024-02-03 ENCOUNTER — Other Ambulatory Visit: Payer: Self-pay | Admitting: Cardiology

## 2024-02-16 ENCOUNTER — Other Ambulatory Visit: Payer: Self-pay | Admitting: Cardiology

## 2024-02-21 ENCOUNTER — Telehealth: Payer: Self-pay | Admitting: Cardiology

## 2024-02-21 MED ORDER — DILTIAZEM HCL ER 120 MG PO CP24: 120.0000 mg | ORAL_CAPSULE | Freq: Every day | ORAL | 0 refills | Status: DC

## 2024-02-21 MED ORDER — CLOPIDOGREL BISULFATE 75 MG PO TABS: 75.0000 mg | ORAL_TABLET | Freq: Every day | ORAL | 0 refills | Status: DC

## 2024-02-21 MED ORDER — ROSUVASTATIN CALCIUM 10 MG PO TABS: 10.0000 mg | ORAL_TABLET | Freq: Every day | ORAL | 0 refills | Status: DC

## 2024-02-21 MED ORDER — HYDROCHLOROTHIAZIDE 25 MG PO TABS: 25.0000 mg | ORAL_TABLET | Freq: Every day | ORAL | 0 refills | Status: DC

## 2024-02-21 MED ORDER — IRBESARTAN 150 MG PO TABS: 150.0000 mg | ORAL_TABLET | Freq: Every day | ORAL | 0 refills | Status: DC

## 2024-02-21 NOTE — Telephone Encounter (Signed)
 Pt's medications were sent to pt's pharmacy as requested. Confirmation received.

## 2024-02-21 NOTE — Telephone Encounter (Signed)
*  STAT* If patient is at the pharmacy, call can be transferred to refill team.   1. Which medications need to be refilled? (please list name of each medication and dose if known)   clopidogrel  (PLAVIX ) 75 MG tablet    diltiazem  (DILACOR XR ) 120 MG 24 hr capsule  hydrochlorothiazide  (HYDRODIURIL ) 25 MG tablet  irbesartan  (AVAPRO ) 150 MG tablet  rosuvastatin  (CRESTOR ) 10 MG tablet  2. Would you like to learn more about the convenience, safety, & potential cost savings by using the [facility]? No   3. Are you open to using the [facility] (Type [facility]) No   4. Which pharmacy/location (including street and city if local pharmacy) is medication to be sent to? [id number] - [place], [place] - [address]    5. Do they need a 30 day or 90 day supply? 90 day  Pt has scheduled appt on 9/10

## 2024-02-23 ENCOUNTER — Ambulatory Visit: Admitting: Family

## 2024-02-27 ENCOUNTER — Encounter: Payer: Self-pay | Admitting: Family

## 2024-02-27 ENCOUNTER — Ambulatory Visit: Admitting: Family

## 2024-02-27 VITALS — BP 134/78 | HR 75 | Ht 66.0 in | Wt 226.2 lb

## 2024-02-27 DIAGNOSIS — N390 Urinary tract infection, site not specified: Secondary | ICD-10-CM | POA: Diagnosis not present

## 2024-02-27 DIAGNOSIS — E782 Mixed hyperlipidemia: Secondary | ICD-10-CM

## 2024-02-27 DIAGNOSIS — R202 Paresthesia of skin: Secondary | ICD-10-CM | POA: Diagnosis not present

## 2024-02-27 DIAGNOSIS — R519 Headache, unspecified: Secondary | ICD-10-CM | POA: Diagnosis not present

## 2024-02-27 DIAGNOSIS — R200 Anesthesia of skin: Secondary | ICD-10-CM

## 2024-02-27 DIAGNOSIS — E559 Vitamin D deficiency, unspecified: Secondary | ICD-10-CM

## 2024-02-27 DIAGNOSIS — R5383 Other fatigue: Secondary | ICD-10-CM

## 2024-02-27 LAB — CBC WITH DIFFERENTIAL/PLATELET
Basophils Absolute: 0.1 10*3/uL (ref 0.0–0.1)
Basophils Relative: 0.9 % (ref 0.0–3.0)
Eosinophils Absolute: 0.1 10*3/uL (ref 0.0–0.7)
Eosinophils Relative: 1 % (ref 0.0–5.0)
HCT: 41.6 % (ref 36.0–46.0)
Hemoglobin: 14.3 g/dL (ref 12.0–15.0)
Lymphocytes Relative: 10 % — ABNORMAL LOW (ref 12.0–46.0)
Lymphs Abs: 0.6 10*3/uL — ABNORMAL LOW (ref 0.7–4.0)
MCHC: 34.3 g/dL (ref 30.0–36.0)
MCV: 87.9 fl (ref 78.0–100.0)
Monocytes Absolute: 0.4 10*3/uL (ref 0.1–1.0)
Monocytes Relative: 7 % (ref 3.0–12.0)
Neutro Abs: 4.9 10*3/uL (ref 1.4–7.7)
Neutrophils Relative %: 81.1 % — ABNORMAL HIGH (ref 43.0–77.0)
Platelets: 172 10*3/uL (ref 150.0–400.0)
RBC: 4.73 Mil/uL (ref 3.87–5.11)
RDW: 14.2 % (ref 11.5–15.5)
WBC: 6.1 10*3/uL (ref 4.0–10.5)

## 2024-02-27 LAB — LIPID PANEL
Cholesterol: 171 mg/dL (ref 0–200)
HDL: 68.9 mg/dL (ref >39.00)
LDL Cholesterol: 91 mg/dL (ref 0–99)
NonHDL: 101.97
Total CHOL/HDL Ratio: 2
Triglycerides: 57 mg/dL (ref 0.0–149.0)
VLDL: 11.4 mg/dL (ref 0.0–40.0)

## 2024-02-27 LAB — VITAMIN D 25 HYDROXY (VIT D DEFICIENCY, FRACTURES): VITD: 61.83 ng/mL (ref 30.00–100.00)

## 2024-02-27 LAB — COMPREHENSIVE METABOLIC PANEL WITH GFR
ALT: 11 U/L (ref 0–35)
AST: 16 U/L (ref 0–37)
Albumin: 4.6 g/dL (ref 3.5–5.2)
Alkaline Phosphatase: 57 U/L (ref 39–117)
BUN: 20 mg/dL (ref 6–23)
CO2: 32 meq/L (ref 19–32)
Calcium: 9.8 mg/dL (ref 8.4–10.5)
Chloride: 100 meq/L (ref 96–112)
Creatinine, Ser: 1.08 mg/dL (ref 0.40–1.20)
GFR: 51.64 mL/min — ABNORMAL LOW (ref >60.00)
Glucose, Bld: 88 mg/dL (ref 70–99)
Potassium: 4.1 meq/L (ref 3.5–5.1)
Sodium: 139 meq/L (ref 135–145)
Total Bilirubin: 1 mg/dL (ref 0.2–1.2)
Total Protein: 6.6 g/dL (ref 6.0–8.3)

## 2024-02-27 LAB — MAGNESIUM: Magnesium: 2 mg/dL (ref 1.5–2.5)

## 2024-02-27 LAB — VITAMIN B12: Vitamin B-12: 1316 pg/mL — ABNORMAL HIGH (ref 211–911)

## 2024-02-27 LAB — TSH: TSH: 2.21 u[IU]/mL (ref 0.35–5.50)

## 2024-02-27 NOTE — Patient Instructions (Signed)
 We will get the MRI updated for you; we will need to consider having you see a headache specialist;
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
## 2024-02-28 ENCOUNTER — Other Ambulatory Visit: Payer: Self-pay | Admitting: Family

## 2024-02-28 DIAGNOSIS — Z1231 Encounter for screening mammogram for malignant neoplasm of breast: Secondary | ICD-10-CM

## 2024-02-28 LAB — URINE CULTURE
MICRO NUMBER:: 16532272
SPECIMEN QUALITY:: ADEQUATE

## 2024-02-29 ENCOUNTER — Other Ambulatory Visit: Payer: Self-pay | Admitting: Cardiology

## 2024-02-29 ENCOUNTER — Ambulatory Visit: Payer: Self-pay | Admitting: Family

## 2024-03-01 ENCOUNTER — Telehealth: Payer: Self-pay

## 2024-03-01 NOTE — Telephone Encounter (Signed)
 Copied from CRM [id number]. Topic: Clinical - Lab/Test Results >> Mar 01, 2024 10:02 AM Lajean Pike wrote: Reason for CRM: Patient called in regards to lab results. Relayed lab results. Patient has no further questions but is requesting if results not be sent to her MyChart and that she receives a phone call instead because she doesn't utilize MyChart.

## 2024-03-06 ENCOUNTER — Ambulatory Visit (HOSPITAL_BASED_OUTPATIENT_CLINIC_OR_DEPARTMENT_OTHER)
Admission: RE | Admit: 2024-03-06 | Discharge: 2024-03-06 | Disposition: A | Discharge status: Home / Self Care | Source: Ambulatory Visit | Attending: Family | Admitting: Family

## 2024-03-06 DIAGNOSIS — R519 Headache, unspecified: Secondary | ICD-10-CM | POA: Diagnosis not present

## 2024-03-06 DIAGNOSIS — I6782 Cerebral ischemia: Secondary | ICD-10-CM | POA: Diagnosis not present

## 2024-03-06 DIAGNOSIS — G9389 Other specified disorders of brain: Secondary | ICD-10-CM | POA: Diagnosis not present

## 2024-03-06 MED ORDER — GADOBUTROL 1 MMOL/ML IV SOLN
10.0000 mL | Freq: Once | INTRAVENOUS | Status: AC | PRN
  Administered 2024-03-06: 10 mL via INTRAVENOUS

## 2024-03-07 ENCOUNTER — Other Ambulatory Visit: Payer: Self-pay | Admitting: Family

## 2024-03-07 DIAGNOSIS — G4489 Other headache syndrome: Secondary | ICD-10-CM

## 2024-03-10 ENCOUNTER — Ambulatory Visit (HOSPITAL_BASED_OUTPATIENT_CLINIC_OR_DEPARTMENT_OTHER)

## 2024-03-11 ENCOUNTER — Ambulatory Visit
Admission: RE | Admit: 2024-03-11 | Discharge: 2024-03-11 | Disposition: A | Discharge status: Home / Self Care | Source: Ambulatory Visit | Attending: Family | Admitting: Family

## 2024-03-11 DIAGNOSIS — Z1231 Encounter for screening mammogram for malignant neoplasm of breast: Secondary | ICD-10-CM | POA: Diagnosis not present

## 2024-03-13 ENCOUNTER — Encounter (HOSPITAL_BASED_OUTPATIENT_CLINIC_OR_DEPARTMENT_OTHER): Payer: Self-pay | Admitting: Emergency Medicine

## 2024-03-13 ENCOUNTER — Emergency Department (HOSPITAL_BASED_OUTPATIENT_CLINIC_OR_DEPARTMENT_OTHER)
Admission: EM | Admit: 2024-03-13 | Discharge: 2024-03-13 | Disposition: A | Discharge status: Home / Self Care | Attending: Emergency Medicine | Admitting: Emergency Medicine

## 2024-03-13 ENCOUNTER — Emergency Department (HOSPITAL_BASED_OUTPATIENT_CLINIC_OR_DEPARTMENT_OTHER)

## 2024-03-13 ENCOUNTER — Other Ambulatory Visit: Payer: Self-pay

## 2024-03-13 DIAGNOSIS — R079 Chest pain, unspecified: Secondary | ICD-10-CM

## 2024-03-13 DIAGNOSIS — Z7982 Long term (current) use of aspirin: Secondary | ICD-10-CM | POA: Insufficient documentation

## 2024-03-13 DIAGNOSIS — I10 Essential (primary) hypertension: Secondary | ICD-10-CM | POA: Insufficient documentation

## 2024-03-13 DIAGNOSIS — R519 Headache, unspecified: Secondary | ICD-10-CM | POA: Diagnosis not present

## 2024-03-13 DIAGNOSIS — Z79899 Other long term (current) drug therapy: Secondary | ICD-10-CM | POA: Diagnosis not present

## 2024-03-13 DIAGNOSIS — R0789 Other chest pain: Secondary | ICD-10-CM | POA: Insufficient documentation

## 2024-03-13 LAB — CBC WITH DIFFERENTIAL/PLATELET
Abs Immature Granulocytes: 0.02 10*3/uL (ref 0.00–0.07)
Basophils Absolute: 0.1 10*3/uL (ref 0.0–0.1)
Basophils Relative: 1 %
Eosinophils Absolute: 0.2 10*3/uL (ref 0.0–0.5)
Eosinophils Relative: 3 %
HCT: 35.5 % — ABNORMAL LOW (ref 36.0–46.0)
Hemoglobin: 12.5 g/dL (ref 12.0–15.0)
Immature Granulocytes: 0 %
Lymphocytes Relative: 16 %
Lymphs Abs: 1.1 10*3/uL (ref 0.7–4.0)
MCH: 30.7 pg (ref 26.0–34.0)
MCHC: 35.2 g/dL (ref 30.0–36.0)
MCV: 87.2 fL (ref 80.0–100.0)
Monocytes Absolute: 0.6 10*3/uL (ref 0.1–1.0)
Monocytes Relative: 8 %
Neutro Abs: 5.1 10*3/uL (ref 1.7–7.7)
Neutrophils Relative %: 72 %
Platelets: 167 10*3/uL (ref 150–400)
RBC: 4.07 MIL/uL (ref 3.87–5.11)
RDW: 13.7 % (ref 11.5–15.5)
WBC: 7 10*3/uL (ref 4.0–10.5)
nRBC: 0 % (ref 0.0–0.2)

## 2024-03-13 LAB — COMPREHENSIVE METABOLIC PANEL WITH GFR
ALT: 15 U/L (ref 0–44)
AST: 19 U/L (ref 15–41)
Albumin: 4.2 g/dL (ref 3.5–5.0)
Alkaline Phosphatase: 67 U/L (ref 38–126)
Anion gap: 13 (ref 5–15)
BUN: 17 mg/dL (ref 8–23)
CO2: 26 mmol/L (ref 22–32)
Calcium: 9.2 mg/dL (ref 8.9–10.3)
Chloride: 101 mmol/L (ref 98–111)
Creatinine, Ser: 1.11 mg/dL — ABNORMAL HIGH (ref 0.44–1.00)
GFR, Estimated: 53 mL/min — ABNORMAL LOW (ref >60)
Glucose, Bld: 100 mg/dL — ABNORMAL HIGH (ref 70–99)
Potassium: 3.3 mmol/L — ABNORMAL LOW (ref 3.5–5.1)
Sodium: 140 mmol/L (ref 135–145)
Total Bilirubin: 0.3 mg/dL (ref 0.0–1.2)
Total Protein: 6.2 g/dL — ABNORMAL LOW (ref 6.5–8.1)

## 2024-03-13 LAB — TROPONIN T, HIGH SENSITIVITY
Troponin T High Sensitivity: <15 ng/L (ref <19)
Troponin T High Sensitivity: <15 ng/L (ref <19)

## 2024-03-13 LAB — LIPASE, BLOOD: Lipase: 35 U/L (ref 11–51)

## 2024-03-13 MED ORDER — BUTALBITAL-APAP-CAFFEINE 50-325-40 MG PO TABS: 1.0000 | ORAL_TABLET | Freq: Four times a day (QID) | ORAL | 0 refills | Status: DC | PRN

## 2024-03-13 MED ORDER — IOHEXOL 350 MG/ML SOLN
75.0000 mL | Freq: Once | INTRAVENOUS | Status: AC | PRN
  Administered 2024-03-13: 75 mL via INTRAVENOUS

## 2024-03-13 NOTE — ED Provider Notes (Signed)
 [place] [facility] AT [facility] Provider Note   CSN: [id number] Arrival date & time: 03/13/24  0059     Patient presents with: Chest Pain   Pamela Sims is a 73 y.o. female.  {Add pertinent medical, surgical, social history, OB history to HPI:32947} 73 yo F w/ multiple medical problems here for hypertension and headache. Has been dealing with headaches for the last couple weeks. Had a negative MRI w/wo about a week ago. No angio. Tonight headache again, BP significantly elevated at 190's systolic PTA so came in for eval. Compliant with home meds. No recent changes aside from her kidney medication.    Chest Pain      Prior to Admission medications   Medication Sig Start Date End Date Taking? Authorizing Provider  aspirin  81 MG EC tablet Take 1 tablet by mouth daily.    [provider]  cholecalciferol  (VITAMIN D3) 25 MCG (1000 UT) tablet Take 5,000 Units by mouth daily.    [provider]  clopidogrel  (PLAVIX ) 75 MG tablet Take 1 tablet (75 mg total) by mouth daily. 02/21/24   Tobb, Kardie, DO  diltiazem  (DILACOR XR ) 120 MG 24 hr capsule Take 1 capsule (120 mg total) by mouth daily. 02/21/24   Tobb, Kardie, DO  escitalopram  (LEXAPRO ) 10 MG tablet Take 15 mg by mouth at bedtime.    [provider]  famotidine  (PEPCID ) 40 MG tablet TAKE 1 TABLET BY MOUTH DAILY 09/28/23   Adra Alanis, FNP  hydrochlorothiazide  (HYDRODIURIL ) 25 MG tablet Take 1 tablet (25 mg total) by mouth daily. 02/21/24   Tobb, Kardie, DO  irbesartan  (AVAPRO ) 150 MG tablet Take 1 tablet (150 mg total) by mouth daily. 02/21/24   Tobb, Kardie, DO  Multiple Vitamins-Minerals (MULTIVITAMIN WITH MINERALS) tablet Take 1 tablet by mouth daily.    [provider]  nitroGLYCERIN  (NITROSTAT ) 0.4 MG SL tablet Place 1 tablet (0.4 mg total) under the tongue every 5 (five) minutes as needed for chest pain. 07/13/22 07/13/23  Arty Binning, MD  pantoprazole   (PROTONIX ) 40 MG tablet Take 40 mg by mouth 2 (two) times daily. 03/28/22   [provider]  rosuvastatin  (CRESTOR ) 10 MG tablet Take 1 tablet (10 mg total) by mouth at bedtime. 02/21/24   Tobb, Kardie, DO    Allergies: Adhesive [tape] and Doxycycline     Review of Systems  Cardiovascular:  Positive for chest pain.    Updated Vital Signs BP (!) 140/72   Pulse (!) 58   Temp 98.3 F (36.8 C) (Oral)   Resp 14   Ht 5' 6 (1.676 m)   Wt 101.6 kg   SpO2 97%   BMI 36.15 kg/m   Physical Exam Vitals and nursing note reviewed.  Constitutional:      Appearance: She is well-developed.  HENT:     Head: Normocephalic and atraumatic.   Cardiovascular:     Rate and Rhythm: Normal rate and regular rhythm.  Pulmonary:     Effort: No respiratory distress.     Breath sounds: No stridor.  Abdominal:     General: There is no distension.   Musculoskeletal:     Cervical back: Normal range of motion.     Right lower leg: No edema.     Left lower leg: No edema.   Neurological:     General: No focal deficit present.     Mental Status: She is alert.     Comments: No altered mental status, able to  give full seemingly accurate history.  Face is symmetric, EOM's intact, pupils equal and reactive, vision intact, tongue and uvula midline without deviation. Upper and Lower extremity motor 5/5, intact pain perception in distal extremities, 2+ reflexes in biceps, patella and achilles tendons. Able to perform finger to nose normal with both hands. Walks without assistance or evident ataxia.      (all labs ordered are listed, but only abnormal results are displayed) Labs Reviewed  CBC WITH DIFFERENTIAL/PLATELET - Abnormal; Notable for the following components:      Result Value   HCT 35.5 (*)    All other components within normal limits  COMPREHENSIVE METABOLIC PANEL WITH GFR - Abnormal; Notable for the following components:   Potassium 3.3 (*)    Glucose, Bld 100 (*)    Creatinine, Ser  1.11 (*)    Total Protein 6.2 (*)    GFR, Estimated 53 (*)    All other components within normal limits  LIPASE, BLOOD  TROPONIN T, HIGH SENSITIVITY  TROPONIN T, HIGH SENSITIVITY    EKG: EKG Interpretation Date/Time:  Wednesday March 13 2024 01:09:53 EDT Ventricular Rate:  66 PR Interval:    QRS Duration:  108 QT Interval:  456 QTC Calculation: 478 R Axis:   57  Text Interpretation: sinus rhythm with some baseline artifact, PR < 200 Confirmed by Eve Hinders [phone]) on 03/13/2024 1:27:22 AM  Radiology: CT ANGIO HEAD NECK W WO CM Result Date: 03/13/2024 CLINICAL DATA:  severe headache and hypertension EXAM: CT ANGIOGRAPHY HEAD AND NECK WITH AND WITHOUT CONTRAST TECHNIQUE: Multidetector CT imaging of the head and neck was performed using the standard protocol during bolus administration of intravenous contrast. Multiplanar CT image reconstructions and MIPs were obtained to evaluate the vascular anatomy. Carotid stenosis measurements (when applicable) are obtained utilizing NASCET criteria, using the distal internal carotid diameter as the denominator. RADIATION DOSE REDUCTION: This exam was performed according to the departmental dose-optimization program which includes automated exposure control, adjustment of the mA and/or kV according to patient size and/or use of iterative reconstruction technique. CONTRAST:  75mL OMNIPAQUE  IOHEXOL  350 MG/ML SOLN COMPARISON:  CT head 04/10/2023. FINDINGS: CT HEAD FINDINGS Brain: No evidence of acute infarction, hemorrhage, hydrocephalus, extra-axial collection or mass lesion/mass effect. Vascular: See below. Skull: No acute fracture. Sinuses/Orbits: Clear stenosis.  No acute orbital findings. Other: No mastoid effusions. Review of the MIP images confirms the above findings CTA NECK FINDINGS Aortic arch: Great vessel origins are patent without significant stenosis. Right carotid system: No evidence of dissection, stenosis (50% or greater), or occlusion. Left  carotid system: No evidence of dissection, stenosis (50% or greater), or occlusion. Vertebral arteries: Codominant. No evidence of dissection, stenosis (50% or greater), or occlusion. Skeleton: No acute abnormality on limited assessment. Other neck: No acute abnormality on limited assessment. Upper chest: Visualized lung apices are clear. Review of the MIP images confirms the above findings CTA HEAD FINDINGS Anterior circulation: Bilateral intracranial ICAs, MCAs, and ACAs are patent without proximal hemodynamically significant stenosis. No aneurysm identified. Posterior circulation: Bilateral intradural vertebral arteries, basilar artery and bilateral posterior cerebral arteries are patent without proximal hemodynamically significant stenosis. No aneurysm identified. Venous sinuses: As permitted by contrast timing, patent. Review of the MIP images confirms the above findings IMPRESSION: 1. No evidence of acute intracranial abnormality. 2. No large vessel occlusion or proximal hemodynamically significant stenosis. 3. No aneurysm identified. Electronically Signed   By: Stevenson Elbe M.D.   On: 03/13/2024 02:43   DG Chest 2 View  Result Date: 03/13/2024 EXAM: 2 VIEW(S) XRAY OF THE CHEST 03/13/2024 02:23:00 AM COMPARISON: 11/22/2023 CLINICAL HISTORY: Eval for chest pain. Chest pressure, headache and high PB for last two days. FINDINGS: LUNGS AND PLEURA: No focal pulmonary opacity. No pulmonary edema. No pleural effusion. No pneumothorax. HEART AND MEDIASTINUM: No acute abnormality of the cardiac and mediastinal silhouettes. BONES AND SOFT TISSUES: Cervical spine fixation hardware, incompletely visualized. Mild degenerative changes of the mid thoracic spine. No acute osseous abnormality. IMPRESSION: 1. No acute cardiopulmonary abnormality. Electronically signed by: Zadie Herter MD 03/13/2024 02:30 AM EDT RP Workstation: [id number]    {Document cardiac monitor, telemetry assessment procedure when  appropriate:32947} Procedures   Medications Ordered in the ED  iohexol  (OMNIPAQUE ) 350 MG/ML injection 75 mL (75 mLs Intravenous Contrast Given 03/13/24 0210)      {Click here for ABCD2, HEART and other calculators REFRESH Note before signing:1}                              Medical Decision Making Amount and/or Complexity of Data Reviewed Labs: ordered. Radiology: ordered.  Risk Prescription drug management.  HA and HTN - will evaluate arteries, already had negative MRI so doubt space occupying lesion.     - cta good Had some cp as well - likely related to bp, ECG ok - will get delta trops.    - first trop is good, pending second.  ***  {Document critical care time when appropriate  Document review of labs and clinical decision tools ie CHADS2VASC2, etc  Document your independent review of radiology images and any outside records  Document your discussion with family members, caretakers and with consultants  Document social determinants of health affecting pt's care  Document your decision making why or why not admission, treatments were needed:32947:::1}   Final diagnoses:  None    ED Discharge Orders     None

## 2024-03-13 NOTE — ED Triage Notes (Signed)
 Chest pressure, headache and high PB for last two days. Has had headaches and is being seen for same.

## 2024-03-13 NOTE — ED Notes (Signed)
 Pt took a nitroglycerin  at midnight and 2 tylenol  at 11pm

## 2024-03-14 ENCOUNTER — Other Ambulatory Visit: Payer: Self-pay | Admitting: Family

## 2024-03-14 DIAGNOSIS — R928 Other abnormal and inconclusive findings on diagnostic imaging of breast: Secondary | ICD-10-CM

## 2024-03-14 MED ORDER — IRBESARTAN 300 MG PO TABS: 300.0000 mg | ORAL_TABLET | Freq: Every day | ORAL | 0 refills | Status: DC

## 2024-03-15 ENCOUNTER — Encounter: Payer: Self-pay | Admitting: Cardiology

## 2024-03-15 NOTE — Telephone Encounter (Signed)
 Error

## 2024-03-19 ENCOUNTER — Ambulatory Visit: Admitting: Family

## 2024-03-19 ENCOUNTER — Encounter: Payer: Self-pay | Admitting: Family

## 2024-03-19 VITALS — BP 140/78 | HR 70 | Ht 66.0 in | Wt 226.0 lb

## 2024-03-19 DIAGNOSIS — I10 Essential (primary) hypertension: Secondary | ICD-10-CM

## 2024-03-19 MED ORDER — SPIRONOLACTONE 25 MG PO TABS: 12.5000 mg | ORAL_TABLET | Freq: Every day | ORAL | 0 refills | Status: DC

## 2024-03-19 NOTE — Patient Instructions (Signed)
 Please add 1/2 tablet of Spironolactone at night to your other medications; your cardiologist will most likely make changes and I think this is appropriate;  Please ask your cardiologist to follow up with your concerns about getting the carotid ultrasound;   We can consider having you see neurology once we have a better understanding of what is going on with your blood pressure. I think the headaches/ pressure sensation is due to your blood pressure being uncontrolled. Your imaging at the ER was reassuring.   Please take your blood pressure cuff to your cardiology appointment;
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
## 2024-03-22 ENCOUNTER — Ambulatory Visit: Attending: Physician Assistant | Admitting: Physician Assistant

## 2024-03-22 ENCOUNTER — Encounter: Payer: Self-pay | Admitting: Physician Assistant

## 2024-03-22 VITALS — BP 138/80 | HR 63 | Ht 66.0 in

## 2024-03-22 DIAGNOSIS — K2100 Gastro-esophageal reflux disease with esophagitis, without bleeding: Secondary | ICD-10-CM

## 2024-03-22 DIAGNOSIS — I10 Essential (primary) hypertension: Secondary | ICD-10-CM | POA: Diagnosis not present

## 2024-03-22 DIAGNOSIS — Z9861 Coronary angioplasty status: Secondary | ICD-10-CM

## 2024-03-22 DIAGNOSIS — I2510 Atherosclerotic heart disease of native coronary artery without angina pectoris: Secondary | ICD-10-CM | POA: Diagnosis not present

## 2024-03-22 DIAGNOSIS — E785 Hyperlipidemia, unspecified: Secondary | ICD-10-CM | POA: Diagnosis not present

## 2024-03-22 DIAGNOSIS — R0789 Other chest pain: Secondary | ICD-10-CM

## 2024-03-22 DIAGNOSIS — F419 Anxiety disorder, unspecified: Secondary | ICD-10-CM

## 2024-03-22 MED ORDER — ROSUVASTATIN CALCIUM 20 MG PO TABS: 20.0000 mg | ORAL_TABLET | Freq: Every day | ORAL | 3 refills | Status: AC

## 2024-03-22 NOTE — Patient Instructions (Addendum)
 Medication Instructions:  Take Crestor  20mg  daily.   *If you need a refill on your cardiac medications before your next appointment, please call your pharmacy*   Lab Work: BMET on Wednesday IN 6 WEEKS..................LIPIDS, LPa If you have labs (blood work) drawn today and your tests are completely normal, you will receive your results only by: MyChart Message (if you have MyChart) OR A paper copy in the mail If you have any lab test that is abnormal or we need to change your treatment, we will call you to review the results.   Testing/Procedures: Your physician has requested that you have an echocardiogram IN [place]. Echocardiography is a painless test that uses sound waves to create images of your heart. It provides your doctor with information about the size and shape of your heart and how well your heart's chambers and valves are working. This procedure takes approximately one hour. There are no restrictions for this procedure. Please do NOT wear cologne, perfume, aftershave, or lotions (deodorant is allowed). Please arrive 15 minutes prior to your appointment time.  Please note: We ask at that you not bring children with you during ultrasound (echo/ vascular) testing. Due to room size and safety concerns, children are not allowed in the ultrasound rooms during exams. Our front office staff cannot provide observation of children in our lobby area while testing is being conducted. An adult accompanying a patient to their appointment will only be allowed in the ultrasound room at the discretion of the ultrasound technician under special circumstances. We apologize for any inconvenience.       Please report to Radiology at the [facility] Main Entrance 30 minutes early for your test.  [address] [place], [place] [address]                         OR   Please report to Radiology at [facility] Main Entrance, medical mall, 30 mins prior to your  test.  [address]  [place], [place]  How to Prepare for Your Cardiac PET/CT Stress Test:  Nothing to eat or drink, except water, 3 hours prior to arrival time.  NO caffeine /decaffeinated products, or chocolate 12 hours prior to arrival. (Please note decaffeinated beverages (teas/coffees) still contain caffeine ).  If you have caffeine  within 12 hours prior, the test will need to be rescheduled.  Medication instructions: Do not take erectile dysfunction medications for 72 hours prior to test (sildenafil, tadalafil) Do not take nitrates (isosorbide  mononitrate, Ranexa) the day before or day of test Do not take tamsulosin the day before or morning of test Hold theophylline containing medications for 12 hours. Hold Dipyridamole 48 hours prior to the test.  Diabetic Preparation: If able to eat breakfast prior to 3 hour fasting, you may take all medications, including your insulin. Do not worry if you miss your breakfast dose of insulin - start at your next meal. If you do not eat prior to 3 hour fast-Hold all diabetes (oral and insulin) medications. Patients who wear a continuous glucose monitor MUST remove the device prior to scanning.  You may take your remaining medications with water.  NO perfume, cologne or lotion on chest or abdomen area. FEMALES - Please avoid wearing dresses to this appointment.  Total time is 1 to 2 hours; you may want to bring reading material for the waiting time.  IF YOU THINK YOU MAY BE PREGNANT, OR ARE NURSING PLEASE INFORM THE TECHNOLOGIST.  In preparation for your appointment, medication and supplies will be purchased.  Appointment availability is limited, so if you need to cancel or reschedule, please call the Radiology Department Scheduler at [phone] 24 hours in advance to avoid a cancellation fee of $100.00  What to Expect When you Arrive:  Once you arrive and check in for your appointment, you will be taken to a preparation room within  the Radiology Department.  A technologist or Nurse will obtain your medical history, verify that you are correctly prepped for the exam, and explain the procedure.  Afterwards, an IV will be started in your arm and electrodes will be placed on your skin for EKG monitoring during the stress portion of the exam. Then you will be escorted to the PET/CT scanner.  There, staff will get you positioned on the scanner and obtain a blood pressure and EKG.  During the exam, you will continue to be connected to the EKG and blood pressure machines.  A small, safe amount of a radioactive tracer will be injected in your IV to obtain a series of pictures of your heart along with an injection of a stress agent.    After your Exam:  It is recommended that you eat a meal and drink a caffeinated beverage to counter act any effects of the stress agent.  Drink plenty of fluids for the remainder of the day and urinate frequently for the first couple of hours after the exam.  Your doctor will inform you of your test results within 7-10 business days.  For more information and frequently asked questions, please visit our website: [email or web address]  For questions about your test or how to prepare for your test, please call: Cardiac Imaging Nurse Navigators Office: [phone]   Follow-Up: At [facility], you and your health needs are our priority.  As part of our continuing mission to provide you with exceptional heart care, we have created designated Provider Care Teams.  These Care Teams include your primary Cardiologist (physician) and Advanced Practice Providers (APPs -  Physician Assistants and Nurse Practitioners) who all work together to provide you with the care you need, when you need it.  We recommend signing up for the patient portal called MyChart.  Sign up information is provided on this After Visit Summary.  MyChart is used to connect with patients for Virtual Visits  (Telemedicine).  Patients are able to view lab/test results, encounter notes, upcoming appointments, etc.  Non-urgent messages can be sent to your provider as well.   To learn more about what you can do with MyChart, go to [email or web address].    Your next appointment:   6 week(s)  Provider:   Kardie Tobb, DO    Other Instructions Thank you for choosing [facility]!

## 2024-03-23 ENCOUNTER — Encounter (HOSPITAL_BASED_OUTPATIENT_CLINIC_OR_DEPARTMENT_OTHER): Payer: Self-pay | Admitting: Emergency Medicine

## 2024-03-23 ENCOUNTER — Emergency Department (HOSPITAL_BASED_OUTPATIENT_CLINIC_OR_DEPARTMENT_OTHER)
Admission: EM | Admit: 2024-03-23 | Discharge: 2024-03-24 | Disposition: A | Discharge status: Home / Self Care | Attending: Emergency Medicine | Admitting: Emergency Medicine

## 2024-03-23 ENCOUNTER — Other Ambulatory Visit: Payer: Self-pay

## 2024-03-23 ENCOUNTER — Emergency Department (HOSPITAL_BASED_OUTPATIENT_CLINIC_OR_DEPARTMENT_OTHER)

## 2024-03-23 DIAGNOSIS — M25511 Pain in right shoulder: Secondary | ICD-10-CM | POA: Diagnosis not present

## 2024-03-23 DIAGNOSIS — X500XXA Overexertion from strenuous movement or load, initial encounter: Secondary | ICD-10-CM | POA: Diagnosis not present

## 2024-03-23 DIAGNOSIS — S29012A Strain of muscle and tendon of back wall of thorax, initial encounter: Secondary | ICD-10-CM | POA: Diagnosis not present

## 2024-03-23 DIAGNOSIS — Z7982 Long term (current) use of aspirin: Secondary | ICD-10-CM | POA: Diagnosis not present

## 2024-03-23 DIAGNOSIS — R079 Chest pain, unspecified: Secondary | ICD-10-CM | POA: Diagnosis not present

## 2024-03-23 DIAGNOSIS — M47814 Spondylosis without myelopathy or radiculopathy, thoracic region: Secondary | ICD-10-CM | POA: Diagnosis not present

## 2024-03-23 DIAGNOSIS — S29019A Strain of muscle and tendon of unspecified wall of thorax, initial encounter: Secondary | ICD-10-CM

## 2024-03-23 DIAGNOSIS — M19011 Primary osteoarthritis, right shoulder: Secondary | ICD-10-CM | POA: Diagnosis not present

## 2024-03-23 DIAGNOSIS — S4991XA Unspecified injury of right shoulder and upper arm, initial encounter: Secondary | ICD-10-CM | POA: Diagnosis not present

## 2024-03-23 NOTE — ED Triage Notes (Signed)
 Pt c/o RT shoulder pain after she was helping someone put a heavy object on a shelf yesterday; they almost dropped it and she caught it with her RUE/shoulder; also c/o pain to RT side chest and [facility]; was seen by cardiology yesterday

## 2024-03-23 NOTE — ED Provider Notes (Signed)
 [facility] Provider Note   CSN: [id number] Arrival date & time: 03/23/24  2218     Patient presents with: Chest Pain and Shoulder Pain   Pamela Sims is a 73 y.o. female.  {Add pertinent medical, surgical, social history, OB history to [id number]} Patient presents to the emergency department for evaluation of right shoulder pain and injury.  Patient reports that she was helping someone put a heavy object up on a shelf and it nearly fell, she had to try and catch it and push it back onto the shelf.  She has been having pain behind the right shoulder ever since.  Pain is progressively worsened overnight yesterday and into today.  Patient reports that she cannot find a comfortable position. Moving her arm causes pain.  Pain is now going under the armpit and around to the right chest area.      Prior to Admission medications   Medication Sig Start Date End Date Taking? Authorizing Provider  aspirin  81 MG EC tablet Take 1 tablet by mouth daily.    [provider]  Cholecalciferol  (VITAMIN D ) 50 MCG (2000 UT) CAPS Take 2,000 Units by mouth daily.    [provider]  clopidogrel  (PLAVIX ) 75 MG tablet Take 1 tablet (75 mg total) by mouth daily. 02/21/24   Tobb, Kardie, DO  diltiazem  (DILACOR XR ) 120 MG 24 hr capsule Take 1 capsule (120 mg total) by mouth daily. 02/21/24   Tobb, Kardie, DO  escitalopram  (LEXAPRO ) 10 MG tablet Take 15 mg by mouth at bedtime.    [provider]  escitalopram  (LEXAPRO ) 20 MG tablet Take 20 mg by mouth daily. 01/29/24   [provider]  famotidine  (PEPCID ) 40 MG tablet TAKE 1 TABLET BY MOUTH DAILY 09/28/23   Jason Leita Repine, FNP  hydrochlorothiazide  (HYDRODIURIL ) 25 MG tablet Take 1 tablet (25 mg total) by mouth daily. 02/21/24   Tobb, Kardie, DO  irbesartan  (AVAPRO ) 300 MG tablet Take 1 tablet (300 mg total) by mouth daily. 03/14/24   Jason Leita Repine, FNP  Multiple  Vitamins-Minerals (MULTIVITAMIN WITH MINERALS) tablet Take 1 tablet by mouth daily.    [provider]  nitroGLYCERIN  (NITROSTAT ) 0.4 MG SL tablet Place 1 tablet (0.4 mg total) under the tongue every 5 (five) minutes as needed for chest pain. Patient not taking: Reported on 03/22/2024 07/13/22 03/19/24  Claudene Victory ORN, MD  pantoprazole  (PROTONIX ) 40 MG tablet Take 40 mg by mouth 2 (two) times daily. 03/28/22   [provider]  Probiotic Product (PROBIOTIC PO) Take 1 capsule by mouth daily at 6 (six) AM.    [provider]  rosuvastatin  (CRESTOR ) 10 MG tablet Take 1 tablet (10 mg total) by mouth at bedtime. 02/21/24   Tobb, Kardie, DO  rosuvastatin  (CRESTOR ) 20 MG tablet Take 1 tablet (20 mg total) by mouth daily. 03/22/24 06/20/24  Madie Jon Garre, PA  spironolactone  (ALDACTONE ) 25 MG tablet Take 0.5 tablets (12.5 mg total) by mouth daily. 03/19/24   Jason Leita Repine, FNP    Allergies: Adhesive [tape] and Doxycycline     Review of Systems  Updated Vital Signs BP (!) 181/91 (BP Location: Right Arm)   Pulse 69   Temp 97.8 F (36.6 C)   Resp 20   Ht 5' 6 (1.676 m)   Wt 102.5 kg   SpO2 96%   BMI 36.48 kg/m   Physical Exam Vitals and nursing note reviewed.  Constitutional:  General: She is not in acute distress.    Appearance: She is well-developed.  HENT:     Head: Normocephalic and atraumatic.     Mouth/Throat:     Mouth: Mucous membranes are moist.   Eyes:     General: Vision grossly intact. Gaze aligned appropriately.     Extraocular Movements: Extraocular movements intact.     Conjunctiva/sclera: Conjunctivae normal.    Cardiovascular:     Rate and Rhythm: Normal rate and regular rhythm.     Pulses: Normal pulses.     Heart sounds: Normal heart sounds, S1 normal and S2 normal. No murmur heard.    No friction rub. No gallop.  Pulmonary:     Effort: Pulmonary effort is normal. No respiratory distress.     Breath sounds: Normal breath  sounds.  Abdominal:     General: Bowel sounds are normal.     Palpations: Abdomen is soft.     Tenderness: There is no abdominal tenderness. There is no guarding or rebound.     Hernia: No hernia is present.   Musculoskeletal:        General: No swelling.     Left shoulder: Decreased range of motion (Pain).       Arms:     Cervical back: Full passive range of motion without pain, normal range of motion and neck supple. No spinous process tenderness or muscular tenderness. Normal range of motion.     Thoracic back: Spasms and tenderness present.       Back:     Right lower leg: No edema.     Left lower leg: No edema.   Skin:    General: Skin is warm and dry.     Capillary Refill: Capillary refill takes less than 2 seconds.     Findings: No ecchymosis, erythema, rash or wound.   Neurological:     General: No focal deficit present.     Mental Status: She is alert and oriented to person, place, and time.     GCS: GCS eye subscore is 4. GCS verbal subscore is 5. GCS motor subscore is 6.     Cranial Nerves: Cranial nerves 2-12 are intact.     Sensory: Sensation is intact.     Motor: Motor function is intact.     Coordination: Coordination is intact.   Psychiatric:        Attention and Perception: Attention normal.        Mood and Affect: Mood normal.        Speech: Speech normal.        Behavior: Behavior normal.    (all labs ordered are listed, but only abnormal results are displayed) Labs Reviewed - No data to display  EKG: None  Radiology: No results found.  {Document cardiac monitor, telemetry assessment procedure when appropriate:32947} Procedures   Medications Ordered in the ED - No data to display    {Click here for ABCD2, HEART and other calculators REFRESH Note before signing:1}                              Medical Decision Making  ***  {Document critical care time when appropriate  Document review of labs and clinical decision tools ie CHADS2VASC2,  etc  Document your independent review of radiology images and any outside records  Document your discussion with family members, caretakers and with consultants  Document social determinants of health affecting pt's care  Document your decision making why or why not admission, treatments were needed:32947:::1}   Final diagnoses:  None    ED Discharge Orders     None

## 2024-03-24 DIAGNOSIS — M19011 Primary osteoarthritis, right shoulder: Secondary | ICD-10-CM | POA: Diagnosis not present

## 2024-03-24 DIAGNOSIS — S4991XA Unspecified injury of right shoulder and upper arm, initial encounter: Secondary | ICD-10-CM | POA: Diagnosis not present

## 2024-03-24 DIAGNOSIS — M47814 Spondylosis without myelopathy or radiculopathy, thoracic region: Secondary | ICD-10-CM | POA: Diagnosis not present

## 2024-03-24 DIAGNOSIS — R079 Chest pain, unspecified: Secondary | ICD-10-CM | POA: Diagnosis not present

## 2024-03-24 MED ORDER — OXYCODONE-ACETAMINOPHEN 5-325 MG PO TABS: 1.0000 | ORAL_TABLET | ORAL | 0 refills | Status: DC | PRN

## 2024-03-24 MED ORDER — LIDOCAINE 5 % EX PTCH: 1.0000 | MEDICATED_PATCH | CUTANEOUS | 0 refills | Status: DC

## 2024-03-25 ENCOUNTER — Other Ambulatory Visit: Payer: Self-pay | Admitting: Family

## 2024-03-25 ENCOUNTER — Ambulatory Visit
Admission: RE | Admit: 2024-03-25 | Discharge: 2024-03-25 | Disposition: A | Discharge status: Home / Self Care | Source: Ambulatory Visit | Attending: Family | Admitting: Family

## 2024-03-25 DIAGNOSIS — D360 Benign neoplasm of lymph nodes: Secondary | ICD-10-CM | POA: Diagnosis not present

## 2024-03-25 DIAGNOSIS — R92332 Mammographic heterogeneous density, left breast: Secondary | ICD-10-CM | POA: Diagnosis not present

## 2024-03-25 DIAGNOSIS — R928 Other abnormal and inconclusive findings on diagnostic imaging of breast: Secondary | ICD-10-CM

## 2024-03-25 DIAGNOSIS — N6321 Unspecified lump in the left breast, upper outer quadrant: Secondary | ICD-10-CM | POA: Diagnosis not present

## 2024-04-02 DIAGNOSIS — R0789 Other chest pain: Secondary | ICD-10-CM | POA: Diagnosis not present

## 2024-04-03 ENCOUNTER — Ambulatory Visit: Payer: Self-pay | Admitting: Physician Assistant

## 2024-04-03 DIAGNOSIS — I3139 Other pericardial effusion (noninflammatory): Secondary | ICD-10-CM

## 2024-04-03 LAB — BASIC METABOLIC PANEL WITH GFR
BUN/Creatinine Ratio: 15 (ref 12–28)
BUN: 17 mg/dL (ref 8–27)
CO2: 22 mmol/L (ref 20–29)
Calcium: 9.6 mg/dL (ref 8.7–10.3)
Chloride: 99 mmol/L (ref 96–106)
Creatinine, Ser: 1.14 mg/dL — ABNORMAL HIGH (ref 0.57–1.00)
Glucose: 105 mg/dL — ABNORMAL HIGH (ref 70–99)
Potassium: 4.6 mmol/L (ref 3.5–5.2)
Sodium: 141 mmol/L (ref 134–144)
eGFR: 51 mL/min/1.73 — ABNORMAL LOW (ref >59)

## 2024-04-15 DIAGNOSIS — F909 Attention-deficit hyperactivity disorder, unspecified type: Secondary | ICD-10-CM | POA: Diagnosis not present

## 2024-04-15 DIAGNOSIS — F419 Anxiety disorder, unspecified: Secondary | ICD-10-CM | POA: Diagnosis not present

## 2024-04-15 DIAGNOSIS — F3341 Major depressive disorder, recurrent, in partial remission: Secondary | ICD-10-CM | POA: Diagnosis not present

## 2024-04-19 ENCOUNTER — Encounter (HOSPITAL_COMMUNITY): Payer: Self-pay

## 2024-04-20 DIAGNOSIS — Z96653 Presence of artificial knee joint, bilateral: Secondary | ICD-10-CM | POA: Diagnosis not present

## 2024-04-20 DIAGNOSIS — Z7902 Long term (current) use of antithrombotics/antiplatelets: Secondary | ICD-10-CM | POA: Diagnosis not present

## 2024-04-20 DIAGNOSIS — W19XXXA Unspecified fall, initial encounter: Secondary | ICD-10-CM | POA: Diagnosis not present

## 2024-04-20 DIAGNOSIS — Z96651 Presence of right artificial knee joint: Secondary | ICD-10-CM | POA: Diagnosis not present

## 2024-04-20 DIAGNOSIS — S0003XA Contusion of scalp, initial encounter: Secondary | ICD-10-CM | POA: Diagnosis not present

## 2024-04-20 DIAGNOSIS — S0990XA Unspecified injury of head, initial encounter: Secondary | ICD-10-CM | POA: Diagnosis not present

## 2024-04-20 DIAGNOSIS — Z87891 Personal history of nicotine dependence: Secondary | ICD-10-CM | POA: Diagnosis not present

## 2024-04-20 DIAGNOSIS — S022XXA Fracture of nasal bones, initial encounter for closed fracture: Secondary | ICD-10-CM | POA: Diagnosis not present

## 2024-04-20 DIAGNOSIS — M25561 Pain in right knee: Secondary | ICD-10-CM | POA: Diagnosis not present

## 2024-04-20 DIAGNOSIS — S0083XA Contusion of other part of head, initial encounter: Secondary | ICD-10-CM | POA: Diagnosis not present

## 2024-04-20 DIAGNOSIS — M542 Cervicalgia: Secondary | ICD-10-CM | POA: Diagnosis not present

## 2024-04-20 DIAGNOSIS — M21372 Foot drop, left foot: Secondary | ICD-10-CM | POA: Diagnosis not present

## 2024-04-20 DIAGNOSIS — G44309 Post-traumatic headache, unspecified, not intractable: Secondary | ICD-10-CM | POA: Diagnosis not present

## 2024-04-22 ENCOUNTER — Ambulatory Visit: Attending: Physician Assistant

## 2024-04-22 DIAGNOSIS — R0789 Other chest pain: Secondary | ICD-10-CM

## 2024-04-22 LAB — ECHOCARDIOGRAM COMPLETE
AR max vel: 2.96 cm2
AV Area VTI: 2.8 cm2
AV Area mean vel: 2.75 cm2
AV Mean grad: 4 mmHg
AV Peak grad: 8.1 mmHg
Ao pk vel: 1.42 m/s
Area-P 1/2: 2.45 cm2
S' Lateral: 3.11 cm

## 2024-04-22 NOTE — Telephone Encounter (Signed)
 Patient returned staff call regarding results.

## 2024-04-25 ENCOUNTER — Ambulatory Visit
Admission: RE | Admit: 2024-04-25 | Discharge: 2024-04-25 | Disposition: A | Discharge status: Home / Self Care | Source: Ambulatory Visit | Attending: Physician Assistant | Admitting: Physician Assistant

## 2024-04-25 DIAGNOSIS — R0789 Other chest pain: Secondary | ICD-10-CM | POA: Diagnosis not present

## 2024-04-25 MED ORDER — REGADENOSON 0.4 MG/5ML IV SOLN
0.4000 mg | Freq: Once | INTRAVENOUS | Status: AC
  Administered 2024-04-25: 0.4 mg via INTRAVENOUS
  Filled 2024-04-25: qty 5

## 2024-04-25 MED ORDER — RUBIDIUM RB82 GENERATOR (RUBYFILL)
25.0000 | PACK | Freq: Once | INTRAVENOUS | Status: AC
  Administered 2024-04-25: 24.95 via INTRAVENOUS

## 2024-04-25 MED ORDER — RUBIDIUM RB82 GENERATOR (RUBYFILL)
25.0000 | PACK | Freq: Once | INTRAVENOUS | Status: AC
  Administered 2024-04-25: 24.93 via INTRAVENOUS

## 2024-04-25 MED ORDER — REGADENOSON 0.4 MG/5ML IV SOLN
INTRAVENOUS | Status: AC
  Filled 2024-04-25: qty 5

## 2024-04-27 LAB — NM PET CT CARDIAC PERFUSION MULTI W/ABSOLUTE BLOODFLOW
MBFR: 2.5
Nuc Rest EF: 60 %
Nuc Stress EF: 61 %
Peak HR: 66 {beats}/min
Rest HR: 55 {beats}/min
Rest MBF: 0.68 ml/g/min
Rest Nuclear Isotope Dose: 25 mCi
SRS: 0
SSS: 1
ST Depression (mm): 0 mm
Stress MBF: 1.7 ml/g/min
Stress Nuclear Isotope Dose: 24.9 mCi
TID: 1.09

## 2024-05-01 DIAGNOSIS — M545 Low back pain, unspecified: Secondary | ICD-10-CM | POA: Diagnosis not present

## 2024-05-03 NOTE — Progress Notes (Signed)
 Patient is aware of appt on 05/07/24 at 11:15.

## 2024-05-07 ENCOUNTER — Ambulatory Visit (HOSPITAL_COMMUNITY)
Admission: RE | Admit: 2024-05-07 | Discharge: 2024-05-07 | Disposition: A | Discharge status: Home / Self Care | Source: Ambulatory Visit | Attending: Cardiology | Admitting: Cardiology

## 2024-05-07 DIAGNOSIS — I3139 Other pericardial effusion (noninflammatory): Secondary | ICD-10-CM | POA: Insufficient documentation

## 2024-05-07 LAB — ECHOCARDIOGRAM LIMITED
Calc EF: 67.4 %
S' Lateral: 2.83 cm
Single Plane A2C EF: 67.1 %
Single Plane A4C EF: 67.1 %

## 2024-05-08 ENCOUNTER — Ambulatory Visit: Payer: Self-pay | Admitting: Physician Assistant

## 2024-05-15 ENCOUNTER — Other Ambulatory Visit: Payer: Self-pay | Admitting: Family

## 2024-05-29 ENCOUNTER — Other Ambulatory Visit: Payer: Self-pay | Admitting: Cardiology

## 2024-06-03 DIAGNOSIS — M545 Low back pain, unspecified: Secondary | ICD-10-CM | POA: Diagnosis not present

## 2024-06-05 ENCOUNTER — Encounter: Payer: Self-pay | Admitting: Cardiology

## 2024-06-05 ENCOUNTER — Ambulatory Visit: Attending: Cardiovascular Disease | Admitting: Cardiology

## 2024-06-05 VITALS — BP 128/82 | HR 74 | Ht 66.0 in | Wt 228.8 lb

## 2024-06-05 DIAGNOSIS — Z9861 Coronary angioplasty status: Secondary | ICD-10-CM | POA: Diagnosis not present

## 2024-06-05 DIAGNOSIS — I2510 Atherosclerotic heart disease of native coronary artery without angina pectoris: Secondary | ICD-10-CM

## 2024-06-05 DIAGNOSIS — M79605 Pain in left leg: Secondary | ICD-10-CM

## 2024-06-05 DIAGNOSIS — R29898 Other symptoms and signs involving the musculoskeletal system: Secondary | ICD-10-CM

## 2024-06-05 DIAGNOSIS — M79604 Pain in right leg: Secondary | ICD-10-CM

## 2024-06-05 DIAGNOSIS — Z79899 Other long term (current) drug therapy: Secondary | ICD-10-CM

## 2024-06-05 DIAGNOSIS — G4733 Obstructive sleep apnea (adult) (pediatric): Secondary | ICD-10-CM | POA: Diagnosis not present

## 2024-06-05 MED ORDER — SPIRONOLACTONE 25 MG PO TABS: 12.5000 mg | ORAL_TABLET | Freq: Every day | ORAL | 3 refills | Status: AC

## 2024-06-05 MED ORDER — IRBESARTAN 300 MG PO TABS: 300.0000 mg | ORAL_TABLET | Freq: Every day | ORAL | 3 refills | Status: AC

## 2024-06-05 MED ORDER — DILTIAZEM HCL ER 120 MG PO CP24: 120.0000 mg | ORAL_CAPSULE | Freq: Every day | ORAL | 3 refills | Status: AC

## 2024-06-05 MED ORDER — HYDROCHLOROTHIAZIDE 25 MG PO TABS: 25.0000 mg | ORAL_TABLET | Freq: Every day | ORAL | 3 refills | Status: AC

## 2024-06-05 NOTE — Patient Instructions (Signed)
 Medication Instructions:  Your physician recommends that you continue on your current medications as directed. Please refer to the Current Medication list given to you today.  Refills sent to pharmacy.  *If you need a refill on your cardiac medications before your next appointment, please call your pharmacy*  Lab Work: CMET, Mag If you have labs (blood work) drawn today and your tests are completely normal, you will receive your results only by: MyChart Message (if you have MyChart) OR A paper copy in the mail If you have any lab test that is abnormal or we need to change your treatment, we will call you to review the results.  Testing/Procedures: Your physician has requested that you have an ankle brachial index (ABI). During this test an ultrasound and blood pressure cuff are used to evaluate the arteries that supply the arms and legs with blood. Allow thirty minutes for this exam. There are no restrictions or special instructions.  Please note: We ask at that you not bring children with you during ultrasound (echo/ vascular) testing. Due to room size and safety concerns, children are not allowed in the ultrasound rooms during exams. Our front office staff cannot provide observation of children in our lobby area while testing is being conducted. An adult accompanying a patient to their appointment will only be allowed in the ultrasound room at the discretion of the ultrasound technician under special circumstances. We apologize for any inconvenience.  Your physician has requested that you have a lower extremity arterial duplex. This test is an ultrasound of the arteries in the legs. It looks at arterial blood flow in the legs. Allow one hour for Lower Arterial scans. There are no restrictions or special instructions.  Please note: We ask at that you not bring children with you during ultrasound (echo/ vascular) testing. Due to room size and safety concerns, children are not allowed in the  ultrasound rooms during exams. Our front office staff cannot provide observation of children in our lobby area while testing is being conducted. An adult accompanying a patient to their appointment will only be allowed in the ultrasound room at the discretion of the ultrasound technician under special circumstances. We apologize for any inconvenience.   Follow-Up: At [facility], you and your health needs are our priority.  As part of our continuing mission to provide you with exceptional heart care, our providers are all part of one team.  This team includes your primary Cardiologist (physician) and Advanced Practice Providers or APPs (Physician Assistants and Nurse Practitioners) who all work together to provide you with the care you need, when you need it.  Your next appointment:   9 month(s)  Provider:   Kardie Tobb, DO
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
## 2024-06-06 LAB — COMPREHENSIVE METABOLIC PANEL WITH GFR
ALT: 12 IU/L (ref 0–32)
AST: 18 IU/L (ref 0–40)
Albumin: 4.4 g/dL (ref 3.8–4.8)
Alkaline Phosphatase: 74 IU/L (ref 44–121)
BUN/Creatinine Ratio: 13 (ref 12–28)
BUN: 14 mg/dL (ref 8–27)
Bilirubin Total: 0.4 mg/dL (ref 0.0–1.2)
CO2: 25 mmol/L (ref 20–29)
Calcium: 9.7 mg/dL (ref 8.7–10.3)
Chloride: 99 mmol/L (ref 96–106)
Creatinine, Ser: 1.06 mg/dL — ABNORMAL HIGH (ref 0.57–1.00)
Globulin, Total: 1.9 g/dL (ref 1.5–4.5)
Glucose: 100 mg/dL — ABNORMAL HIGH (ref 70–99)
Potassium: 4.4 mmol/L (ref 3.5–5.2)
Sodium: 140 mmol/L (ref 134–144)
Total Protein: 6.3 g/dL (ref 6.0–8.5)
eGFR: 56 mL/min/1.73 — ABNORMAL LOW (ref >59)

## 2024-06-06 LAB — MAGNESIUM: Magnesium: 1.9 mg/dL (ref 1.6–2.3)

## 2024-06-10 ENCOUNTER — Encounter (HOSPITAL_BASED_OUTPATIENT_CLINIC_OR_DEPARTMENT_OTHER): Payer: Self-pay

## 2024-06-10 ENCOUNTER — Ambulatory Visit: Payer: Self-pay | Admitting: Cardiology

## 2024-06-10 ENCOUNTER — Ambulatory Visit (HOSPITAL_BASED_OUTPATIENT_CLINIC_OR_DEPARTMENT_OTHER)

## 2024-06-10 VITALS — BP 137/77 | HR 70 | Ht 66.0 in | Wt 228.0 lb

## 2024-06-10 DIAGNOSIS — G4719 Other hypersomnia: Secondary | ICD-10-CM

## 2024-06-10 DIAGNOSIS — G4710 Hypersomnia, unspecified: Secondary | ICD-10-CM

## 2024-06-10 DIAGNOSIS — Z87891 Personal history of nicotine dependence: Secondary | ICD-10-CM

## 2024-06-10 NOTE — Patient Instructions (Addendum)
 Complete home sleep test as ordered.  Follow up in clinic 4-6 weeks after study completed to review results.  See attached information regarding sleep apnea and sleep study.  Follow sleep hygiene as discussed.

## 2024-06-10 NOTE — Progress Notes (Signed)
 Epworth Sleepiness Scale  Use the following scale to choose the most appropriate number for each situation. 0 Would never nod off 1  Slight  chance of nodding off 2 Moderate chance of nodding off 3 High chance of nodding off  Sitting and reading: 3 Watching TV: 2 Sitting, inactive, in a public place (e.g., in a meeting, theater, or dinner event): 0 As a passenger in a car for an hour or0 more without stopping for a break: 0 Lying down to rest when circumstances permit:1 Sitting and talking to someone: 0 Sitting quietly after a meal without alcohol: 0 In a car, while stopped for a few  minutes in traffic or at a light: 0  TOTOAL: 6

## 2024-06-12 ENCOUNTER — Ambulatory Visit (HOSPITAL_BASED_OUTPATIENT_CLINIC_OR_DEPARTMENT_OTHER)

## 2024-06-12 DIAGNOSIS — M545 Low back pain, unspecified: Secondary | ICD-10-CM | POA: Diagnosis not present

## 2024-06-12 DIAGNOSIS — G5702 Lesion of sciatic nerve, left lower limb: Secondary | ICD-10-CM | POA: Diagnosis not present

## 2024-06-21 DIAGNOSIS — Z20828 Contact with and (suspected) exposure to other viral communicable diseases: Secondary | ICD-10-CM | POA: Diagnosis not present

## 2024-06-24 ENCOUNTER — Ambulatory Visit (HOSPITAL_COMMUNITY)
Admission: RE | Admit: 2024-06-24 | Discharge: 2024-06-24 | Disposition: A | Source: Ambulatory Visit | Attending: Cardiology

## 2024-06-24 ENCOUNTER — Other Ambulatory Visit (HOSPITAL_COMMUNITY): Payer: Self-pay | Admitting: Cardiology

## 2024-06-24 ENCOUNTER — Encounter (HOSPITAL_COMMUNITY): Payer: Self-pay

## 2024-06-24 ENCOUNTER — Ambulatory Visit (HOSPITAL_COMMUNITY)
Admission: RE | Admit: 2024-06-24 | Discharge: 2024-06-24 | Disposition: A | Discharge status: Home / Self Care | Source: Ambulatory Visit | Attending: Cardiology | Admitting: Cardiology

## 2024-06-24 DIAGNOSIS — M79604 Pain in right leg: Secondary | ICD-10-CM

## 2024-06-24 DIAGNOSIS — R29898 Other symptoms and signs involving the musculoskeletal system: Secondary | ICD-10-CM

## 2024-06-24 DIAGNOSIS — R531 Weakness: Secondary | ICD-10-CM

## 2024-06-25 ENCOUNTER — Telehealth: Payer: Self-pay | Admitting: Cardiology

## 2024-06-25 ENCOUNTER — Ambulatory Visit: Payer: Self-pay | Admitting: Cardiology

## 2024-06-25 LAB — VAS US ABI WITH/WO TBI
Left ABI: 1.21
Right ABI: 1.09

## 2024-06-25 NOTE — Telephone Encounter (Signed)
 Pt c/o Shortness Of Breath: STAT if SOB developed within the last 24 hours or pt is noticeably SOB on the phone  1. Are you currently SOB (can you hear that pt is SOB on the phone)?  No   2. How long have you been experiencing SOB?  Has been ongoing since last appointment, but becoming more frequent  3. Are you SOB when sitting or when up moving around?  Occurs when she first gets up and starts moving around after sitting or resting for a while  4. Are you currently experiencing any other symptoms?  Not currently--Leg cramping at night, not as alert recently, tired

## 2024-06-25 NOTE — Telephone Encounter (Signed)
 Patient is returning call.

## 2024-06-25 NOTE — Telephone Encounter (Signed)
 Left message to call back.

## 2024-06-25 NOTE — Telephone Encounter (Signed)
 Spoke with pt over the phone and she stated she is not currently short of breath but that this AM and yesterday AM pt felt like something was sitting on her chest but as soon as she gets up and starts moving, she felt better. Sometimes when she first starts walking she gets out of breath but as she continues, it resolves. Pt states she has been having issues with leg cramps with the Spironolactone  and has noticed more shortness of breath since then as well. Explained that we will send this information to Dr. Sheena and her nurse to review and our office will be in touch. Pt verbalized understanding and thanked us  for a call back.

## 2024-06-26 NOTE — Telephone Encounter (Signed)
 Pt scheduled to see provider near the end of this month for further evaluation. Pt agreeable to plan.

## 2024-07-01 ENCOUNTER — Encounter (HOSPITAL_BASED_OUTPATIENT_CLINIC_OR_DEPARTMENT_OTHER)

## 2024-07-07 ENCOUNTER — Encounter

## 2024-07-07 DIAGNOSIS — G4719 Other hypersomnia: Secondary | ICD-10-CM

## 2024-07-08 NOTE — Progress Notes (Deleted)
 Cardiology Office Note:  .   Date:  07/08/2024  ID:  Rock Pamela Sims, DOB 1951-09-16, MRN [id number] PCP: Jason Leita Repine, FNP (Inactive)  [facility] Providers Cardiologist:  Dub Huntsman, DO { Click to update primary MD,subspecialty MD or APP then REFRESH:1}   History of Present Illness: .   Pamela Sims is a 73 y.o. female  with a hx of coronary artery disease now status post PCI to the LAD 2023, hypertension, hyperlipidemia, OSA.   Patient was seen by Dr. Huntsman 05/2024 trouble with fatigue and am headaches not tolerating CPAP and ENT didn't recommend surgery. Referred back to pulm.   ROS: ***  Studies Reviewed: SABRA         Prior CV Studies: {Select studies to display:26339}  LHC 06/2022 CONCLUSIONS: 85% proximal LAD with RFR 0.86, treated with 12 mm x 2.75 Synergy postdilated to 3.0 mm in diameter at high pressure to less than 10% stenosis. LAD beyond the stent contains moderate diffuse disease up to 50%.  RFR post stenting 0.92. Normal left main Widely patent dominant circumflex 60% nondominant right coronary Normal LV function and normal LVEDP EF 55%   RECOMMENDATIONS: Same-day discharge Aspirin  and Plavix  dual antiplatelet therapy for 12 months. Aggressive antilipid therapy.   TTE 12/03/2020     ECHOCARDIOGRAM REPORT    1. Left ventricular ejection fraction, by estimation, is 60 to 65%. The left ventricle has normal function. The left ventricle has no regional wall motion abnormalities. Left ventricular diastolic parameters are consistent with Grade I diastolic dysfunction (impaired relaxation).  2. Right ventricular systolic function is normal. The right ventricular size is normal. There is normal pulmonary artery systolic pressure. The estimated right ventricular systolic pressure is 16.4 mmHg.  3. The mitral valve is normal in structure. Trivial mitral valve regurgitation. No evidence of mitral stenosis.  4. The aortic valve was not well  visualized. Aortic valve regurgitation is not visualized. No aortic stenosis is present.  5. The inferior vena cava is normal in size with greater than 50% respiratory variability, suggesting right atrial pressure of 3 mmHg.       Risk Assessment/Calculations:   {Does this patient have ATRIAL FIBRILLATION?[phone]} No BP recorded.  {Refresh Note OR Click here to enter BP  :1}***       Physical Exam:   VS:  There were no vitals taken for this visit.   Orhtostatics: No data found. Wt Readings from Last 3 Encounters:  06/10/24 228 lb (103.4 kg)  06/05/24 228 lb 12.8 oz (103.8 kg)  03/23/24 226 lb (102.5 kg)    GEN: Well nourished, well developed in no acute distress NECK: No JVD; No carotid bruits CARDIAC: ***RRR, no murmurs, rubs, gallops RESPIRATORY:  Clear to auscultation without rales, wheezing or rhonchi  ABDOMEN: Soft, non-tender, non-distended EXTREMITIES:  No edema; No deformity   ASSESSMENT AND PLAN: .    CAD - continue current medication regimen.    Essential hypertension Blood pressure consistently elevated at 148/72 mmHg. Current regimen includes irbesartan , spironolactone , hydrochlorothiazide , and Cardizem .  - Repeat blood work to assess renal function and electrolytes. - Refill Cardizem , hydrochlorothiazide , and spironolactone . - Monitor blood pressure daily at home. - Bring home blood pressure cuff to next appointment for calibration.   Obstructive sleep apnea Contributing to fatigue and morning headaches. Previous CPAP use caused uvular swelling and breathing difficulty. ENT did not recommend surgery. - Refer to pulmonology for evaluation and management. - Discuss alternative treatment options with pulmonology.   Chronic  back pain with lumbar disc bulges and lower extremity weakness. MRI shows lumbar disc bulges. Differential includes possible vascular insufficiency. Cardiac PET scan and echocardiogram were normal. - Order ultrasound of lower extremities to  evaluate for vascular obstruction. The patient understands the need to lose weight with diet and exercise. We have discussed specific strategies for this.       {Are you ordering a CV Procedure (e.g. stress test, cath, DCCV, TEE, etc)?   Press F2        :[id number]}  Dispo: ***  Signed, [name], PA-C

## 2024-07-09 ENCOUNTER — Telehealth: Payer: Self-pay | Admitting: Diagnostic Neuroimaging

## 2024-07-09 NOTE — Telephone Encounter (Signed)
 Patient called to verify appointment and confirm.

## 2024-07-10 ENCOUNTER — Encounter: Payer: Self-pay | Admitting: Diagnostic Neuroimaging

## 2024-07-10 ENCOUNTER — Ambulatory Visit: Admitting: Diagnostic Neuroimaging

## 2024-07-10 VITALS — BP 108/68 | HR 72 | Ht 66.0 in | Wt 225.0 lb

## 2024-07-10 DIAGNOSIS — R269 Unspecified abnormalities of gait and mobility: Secondary | ICD-10-CM | POA: Diagnosis not present

## 2024-07-10 DIAGNOSIS — R29898 Other symptoms and signs involving the musculoskeletal system: Secondary | ICD-10-CM | POA: Diagnosis not present

## 2024-07-10 DIAGNOSIS — G44209 Tension-type headache, unspecified, not intractable: Secondary | ICD-10-CM
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
## 2024-07-11 ENCOUNTER — Other Ambulatory Visit: Payer: Self-pay | Admitting: Family

## 2024-07-11 NOTE — Telephone Encounter (Signed)
 Copied from CRM [id number]. Topic: Clinical - Medication Refill >> Jul 11, 2024  3:03 PM Taleah C wrote: Medication: plavix , nitroglycerin    Has the patient contacted their pharmacy? Yes Per the pharmacy they reached out last week.   This is the patient's preferred pharmacy:  [facility] [id number] - [place], [place] - [address] [place] [address] Phone: [phone] Fax: [phone]  Is this the correct pharmacy for this prescription? Yes If no, delete pharmacy and type the correct one.   Has the prescription been filled recently? No  Is the patient out of the medication? Yes  Has the patient been seen for an appointment in the last year OR does the patient have an upcoming appointment? No  Can we respond through MyChart? No  Agent: Please be advised that Rx refills may take up to 3 business days. We ask that you follow-up with your pharmacy.

## 2024-07-15 MED ORDER — CLOPIDOGREL BISULFATE 75 MG PO TABS: 75.0000 mg | ORAL_TABLET | Freq: Every day | ORAL | 0 refills | Status: DC

## 2024-07-15 MED ORDER — NITROGLYCERIN 0.4 MG SL SUBL: 0.4000 mg | SUBLINGUAL_TABLET | SUBLINGUAL | 1 refills | Status: AC | PRN

## 2024-07-22 ENCOUNTER — Ambulatory Visit (HOSPITAL_BASED_OUTPATIENT_CLINIC_OR_DEPARTMENT_OTHER)

## 2024-07-22 ENCOUNTER — Ambulatory Visit: Attending: Physician Assistant | Admitting: Physician Assistant

## 2024-07-22 DIAGNOSIS — I10 Essential (primary) hypertension: Secondary | ICD-10-CM

## 2024-07-22 DIAGNOSIS — G4733 Obstructive sleep apnea (adult) (pediatric): Secondary | ICD-10-CM | POA: Diagnosis not present

## 2024-07-22 DIAGNOSIS — R069 Unspecified abnormalities of breathing: Secondary | ICD-10-CM | POA: Diagnosis not present

## 2024-07-22 DIAGNOSIS — I2510 Atherosclerotic heart disease of native coronary artery without angina pectoris: Secondary | ICD-10-CM

## 2024-07-25 ENCOUNTER — Encounter: Payer: Self-pay | Admitting: Family Medicine

## 2024-07-25 ENCOUNTER — Ambulatory Visit: Payer: Self-pay

## 2024-07-25 ENCOUNTER — Ambulatory Visit: Admitting: Family Medicine

## 2024-07-25 VITALS — BP 107/65 | HR 78 | Temp 98.0°F | Ht 66.0 in | Wt 222.0 lb

## 2024-07-25 DIAGNOSIS — J069 Acute upper respiratory infection, unspecified: Secondary | ICD-10-CM

## 2024-07-25 DIAGNOSIS — R051 Acute cough: Secondary | ICD-10-CM | POA: Diagnosis not present

## 2024-07-25 LAB — POC COVID19 BINAXNOW: SARS Coronavirus 2 Ag: NEGATIVE

## 2024-07-25 LAB — POCT INFLUENZA A/B
Influenza A, POC: NEGATIVE
Influenza B, POC: NEGATIVE

## 2024-07-25 MED ORDER — GUAIFENESIN ER 600 MG PO TB12
1200.0000 mg | ORAL_TABLET | Freq: Two times a day (BID) | ORAL | 0 refills | Status: AC
Start: 2024-07-25 — End: ?

## 2024-07-25 MED ORDER — PREDNISONE 20 MG PO TABS: 40.0000 mg | ORAL_TABLET | Freq: Every day | ORAL | 0 refills | Status: AC

## 2024-07-25 NOTE — Telephone Encounter (Signed)
 Called pt back - phone not working. Advised LOD.  Copied from CRM [id number]. Topic: Clinical - Red Word Triage >> Jul 25, 2024  7:44 AM Robinson H wrote: [facility] that prompted transfer to Nurse Triage: Coughing to the point where she's choking, pain in face, headache

## 2024-07-25 NOTE — Telephone Encounter (Signed)
 FYI Only or Action Required?: FYI only for provider: appointment scheduled on 07/25/24.  Patient was last seen in primary care on 03/19/2024 by Jason Leita Repine, FNP.  Called Nurse Triage reporting Cough.  Symptoms began yesterday.  Interventions attempted: OTC medications: delsym, nasal spray.  Symptoms are: gradually worsening.  Triage Disposition: See Physician Within 24 Hours  Patient/caregiver understands and will follow disposition?: Yes  Reason for Disposition  SEVERE coughing spells (e.g., whooping sound after coughing, vomiting after coughing)  Answer Assessment - Initial Assessment Questions Pt reports cough and sinus pain/pressure, headache with chest congestion. No improvement with Delsym and nasal spray.   1. ONSET: When did the cough begin?      2 days  2. SEVERITY: How bad is the cough today?      Severe  3. SPUTUM: Describe the color of your sputum (e.g., none, dry cough; clear, white, yellow, green)     Denies  4. HEMOPTYSIS: Are you coughing up any blood? If Yes, ask: How much? (e.g., flecks, streaks, tablespoons, etc.)     Denies  5. DIFFICULTY BREATHING: Are you having difficulty breathing? If Yes, ask: How bad is it? (e.g., mild, moderate, severe)      Denies  6. FEVER: Do you have a fever? If Yes, ask: What is your temperature, how was it measured, and when did it start?     I don't think so  7. CARDIAC HISTORY: Do you have any history of heart disease? (e.g., heart attack, congestive heart failure)      Cardiac Stent  8. LUNG HISTORY: Do you have any history of lung disease?  (e.g., pulmonary embolus, asthma, emphysema)     Denies  Protocols used: Cough - Acute Non-Productive-A-AH

## 2024-07-25 NOTE — Progress Notes (Signed)
 Acute Office Visit  Subjective:     Patient ID: Pamela Sims, female    DOB: [birth year], 73 y.o.   MRN: [id number]  Chief Complaint  Patient presents with   Cough   Sinus Problem    HPI Patient is in today for cough.  Discussed the use of AI scribe software for clinical note transcription with the patient, who gave verbal consent to proceed.  History of Present Illness Pamela Sims is a 73 year old female who presents with upper respiratory symptoms following recent travel.  She began experiencing sneezing and a dry, hacking cough two days ago after recent travel. There is no mucus production with the cough. She denies fever and chills. She experiences shortness of breath and facial pain and pressure, particularly around the sinuses. Her symptoms have progressed to her chest, although she is not producing any sputum. No ear issues are reported, and she has not been in contact with anyone known to be ill.  No wheezing is reported, and she does not use an inhaler. She has a history of bronchitis following similar symptoms in the past.  Her current medications include Delsym for cough, taken a couple of times a day.      ROS All review of systems negative except what is listed in the HPI      Objective:    BP 107/65   Pulse 78   Temp 98 F (36.7 C) (Oral)   Ht 5' 6 (1.676 m)   Wt 222 lb (100.7 kg)   SpO2 97%   BMI 35.83 kg/m    Physical Exam Vitals reviewed.  Constitutional:      General: She is not in acute distress.    Appearance: Normal appearance. She is not ill-appearing.  HENT:     Right Ear: Tympanic membrane normal.     Left Ear: Tympanic membrane normal.     Mouth/Throat:     Pharynx: Postnasal drip present.  Cardiovascular:     Rate and Rhythm: Normal rate and regular rhythm.     Heart sounds: Normal heart sounds.  Pulmonary:     Effort: Pulmonary effort is normal.     Breath sounds: Normal breath sounds.  Skin:    General: Skin is  warm and dry.  Neurological:     Mental Status: She is alert and oriented to person, place, and time.  Psychiatric:        Mood and Affect: Mood normal.        Behavior: Behavior normal.        Thought Content: Thought content normal.        Judgment: Judgment normal.       Results for orders placed or performed in visit on 07/25/24  POC COVID-19 BinaxNow  Result Value Ref Range   SARS Coronavirus 2 Ag Negative Negative  POCT Influenza A/B  Result Value Ref Range   Influenza A, POC Negative Negative   Influenza B, POC Negative Negative        Assessment & Plan:   Problem List Items Addressed This Visit   None Visit Diagnoses       Acute cough    -  Primary   Relevant Medications   predniSONE  (DELTASONE ) 20 MG tablet   guaiFENesin  (MUCINEX ) 600 MG 12 hr tablet   Other Relevant Orders   POC COVID-19 BinaxNow (Completed)   POCT Influenza A/B (Completed)     Viral URI       Relevant Medications  predniSONE  (DELTASONE ) 20 MG tablet   guaiFENesin  (MUCINEX ) 600 MG 12 hr tablet       Assessment & Plan Acute upper respiratory infection with cough and sinus symptoms Negative for COVID-19, Flu. Likely viral etiology. - Prescribed prednisone  burst.  - Recommended Mucinex  twice daily until symptoms resolve. - Advised Delsym as needed for cough. - Encouraged honey in teas or hot water for throat soothing. - Instructed to call on Monday if no improvement for potential antibiotic prescription. - Recommended wearing a mask and avoiding high-risk populations until symptoms improve.         Meds ordered this encounter  Medications   predniSONE  (DELTASONE ) 20 MG tablet    Sig: Take 2 tablets (40 mg total) by mouth daily with breakfast for 5 days.    Dispense:  10 tablet    Refill:  0    Supervising Provider:   DOMENICA BLACKBIRD A [[id number]]   guaiFENesin  (MUCINEX ) 600 MG 12 hr tablet    Sig: Take 2 tablets (1,200 mg total) by mouth 2 (two) times daily.    Dispense:  30  tablet    Refill:  0    Supervising Provider:   DOMENICA BLACKBIRD A [[id number]]    Return for chronic disease management - December Pamela Sims Mon, NP

## 2024-07-29 ENCOUNTER — Telehealth: Payer: Self-pay

## 2024-07-29 NOTE — Telephone Encounter (Signed)
 Copied from CRM [id number]. Topic: Clinical - Medication Question >> Jul 29, 2024  3:06 PM Pinkey ORN wrote: Reason for CRM: Attention [place] Mon >> Jul 29, 2024  3:07 PM Pinkey ORN wrote: Patient states she was advised that if her cough wasn't getting any better, than to call back and you would prescribe something else. Patient states the cough isn't better, therefor she's requesting a different medication. Please follow up with patient.

## 2024-07-30 ENCOUNTER — Other Ambulatory Visit: Payer: Self-pay | Admitting: Family

## 2024-07-30 MED ORDER — AMOXICILLIN-POT CLAVULANATE 875-125 MG PO TABS: 1.0000 | ORAL_TABLET | Freq: Two times a day (BID) | ORAL | 0 refills | Status: DC

## 2024-07-30 NOTE — Telephone Encounter (Signed)
 Message sent to pt letting her know medication was sent.

## 2024-08-02 ENCOUNTER — Ambulatory Visit (HOSPITAL_BASED_OUTPATIENT_CLINIC_OR_DEPARTMENT_OTHER): Payer: Self-pay | Admitting: *Deleted

## 2024-08-02 NOTE — Telephone Encounter (Signed)
 Left message for PT to call office back. Will follow up if no response

## 2024-08-02 NOTE — Telephone Encounter (Signed)
-----   Message from Candis Dandy sent at 07/31/2024  1:08 PM EST ----- Mild OSA on HST; needs appt to discuss results and treatment options

## 2024-08-12 ENCOUNTER — Encounter (HOSPITAL_BASED_OUTPATIENT_CLINIC_OR_DEPARTMENT_OTHER): Payer: Self-pay

## 2024-08-12 ENCOUNTER — Ambulatory Visit (HOSPITAL_BASED_OUTPATIENT_CLINIC_OR_DEPARTMENT_OTHER)

## 2024-08-12 VITALS — BP 120/65 | HR 71 | Ht 66.0 in | Wt 228.0 lb

## 2024-08-12 DIAGNOSIS — G4733 Obstructive sleep apnea (adult) (pediatric): Secondary | ICD-10-CM
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
## 2024-08-12 NOTE — Patient Instructions (Signed)
 Continue sleep hygiene for sleep apnea management.  Referral to Orthodontics placed for mouthpiece consideration.  Follow up in 3-4 months.

## 2024-10-16 ENCOUNTER — Other Ambulatory Visit: Payer: Self-pay | Admitting: Family

## 2024-10-30 ENCOUNTER — Ambulatory Visit: Admitting: Medical

## 2024-10-30 ENCOUNTER — Ambulatory Visit: Payer: Self-pay | Admitting: Medical

## 2024-10-30 VITALS — BP 120/78 | HR 68 | Temp 98.0°F | Resp 14 | Ht 66.0 in | Wt 225.8 lb

## 2024-10-30 DIAGNOSIS — G4700 Insomnia, unspecified: Secondary | ICD-10-CM

## 2024-10-30 DIAGNOSIS — R5383 Other fatigue: Secondary | ICD-10-CM

## 2024-10-30 DIAGNOSIS — R319 Hematuria, unspecified: Secondary | ICD-10-CM

## 2024-10-30 DIAGNOSIS — Z1211 Encounter for screening for malignant neoplasm of colon: Secondary | ICD-10-CM

## 2024-10-30 DIAGNOSIS — N390 Urinary tract infection, site not specified: Secondary | ICD-10-CM

## 2024-10-30 DIAGNOSIS — E669 Obesity, unspecified: Secondary | ICD-10-CM

## 2024-10-30 DIAGNOSIS — E785 Hyperlipidemia, unspecified: Secondary | ICD-10-CM

## 2024-10-30 DIAGNOSIS — R739 Hyperglycemia, unspecified: Secondary | ICD-10-CM

## 2024-10-30 LAB — POCT URINALYSIS DIPSTICK
Bilirubin, UA: NEGATIVE
Glucose, UA: NEGATIVE
Ketones, UA: NEGATIVE
Leukocytes, UA: NEGATIVE
Nitrite, UA: NEGATIVE
Protein, UA: NEGATIVE
Spec Grav, UA: <=1.005 — AB
Urobilinogen, UA: 0.2 U/dL — AB
pH, UA: 6

## 2024-10-30 LAB — LIPID PANEL
Cholesterol: 134 mg/dL (ref 28–200)
HDL: 59.2 mg/dL
LDL Cholesterol: 60 mg/dL (ref 10–99)
NonHDL: 74.41
Total CHOL/HDL Ratio: 2
Triglycerides: 72 mg/dL (ref 10.0–149.0)
VLDL: 14.4 mg/dL (ref 0.0–40.0)

## 2024-10-30 LAB — CBC WITH DIFFERENTIAL/PLATELET
Basophils Absolute: 0.1 10*3/uL (ref 0.0–0.1)
Basophils Relative: 1 % (ref 0.0–3.0)
Eosinophils Absolute: 0.1 10*3/uL (ref 0.0–0.7)
Eosinophils Relative: 1.3 % (ref 0.0–5.0)
HCT: 38.5 % (ref 36.0–46.0)
Hemoglobin: 13.4 g/dL (ref 12.0–15.0)
Lymphocytes Relative: 10.3 % — ABNORMAL LOW (ref 12.0–46.0)
Lymphs Abs: 0.6 10*3/uL — ABNORMAL LOW (ref 0.7–4.0)
MCHC: 34.8 g/dL (ref 30.0–36.0)
MCV: 88.4 fl (ref 78.0–100.0)
Monocytes Absolute: 0.4 10*3/uL (ref 0.1–1.0)
Monocytes Relative: 6.7 % (ref 3.0–12.0)
Neutro Abs: 4.8 10*3/uL (ref 1.4–7.7)
Neutrophils Relative %: 80.7 % — ABNORMAL HIGH (ref 43.0–77.0)
Platelets: 151 10*3/uL (ref 150.0–400.0)
RBC: 4.36 Mil/uL (ref 3.87–5.11)
RDW: 13.8 % (ref 11.5–15.5)
WBC: 5.9 10*3/uL (ref 4.0–10.5)

## 2024-10-30 LAB — COMPREHENSIVE METABOLIC PANEL WITH GFR
ALT: 9 U/L (ref 3–35)
AST: 13 U/L (ref 5–37)
Albumin: 4.4 g/dL (ref 3.5–5.2)
Alkaline Phosphatase: 55 U/L (ref 39–117)
BUN: 14 mg/dL (ref 6–23)
CO2: 30 meq/L (ref 19–32)
Calcium: 9.6 mg/dL (ref 8.4–10.5)
Chloride: 102 meq/L (ref 96–112)
Creatinine, Ser: 1.03 mg/dL (ref 0.40–1.20)
GFR: 54.41 mL/min — ABNORMAL LOW
Glucose, Bld: 94 mg/dL (ref 70–99)
Potassium: 4.3 meq/L (ref 3.5–5.1)
Sodium: 141 meq/L (ref 135–145)
Total Bilirubin: 0.6 mg/dL (ref 0.2–1.2)
Total Protein: 6.2 g/dL (ref 6.0–8.3)

## 2024-10-30 LAB — HEMOGLOBIN A1C: Hgb A1c MFr Bld: 5.3 % (ref 4.6–6.5)

## 2024-10-30 LAB — T4, FREE: Free T4: 0.92 ng/dL (ref 0.60–1.60)

## 2024-10-30 LAB — TSH: TSH: 2.68 u[IU]/mL (ref 0.35–5.50)

## 2024-10-30 LAB — VITAMIN B12: Vitamin B-12: 565 pg/mL (ref 211–911)
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
## 2024-10-30 NOTE — Patient Instructions (Addendum)
 Recurrent urinary tract infection with hematuria on UA today Reports frequent urge to urinate. Current urinalysis shows blood, no infection confirmed. History of intermittent hematuria. (possible cystoscopy in past as you see urologist in the past) - Ordered urine culture. - Repeat urinalysis in 10-14 days. - Refer to urologist if hematuria persists.  Insomnia with associated fatigue Difficulty falling and maintaining sleep. Previous melatonin trials ineffective. Concerns about medication side effects. - Recommended magnesium  glycinate 200 mg otc at night. - Ordered fatigue labs: metabolic panel, CBC, B12, TSH, T4.  Obesity with elevated sugar in past BMI 36. High activity, low intake, weight elevated.  - Ordered A1c. - Recommended low sugar diet. - Consider weight loss medication if prediabetic.  Prediabetes Craving sweets, previous elevated blood sugar levels. - Ordered A1c. - Recommended low sugar diet. - Consider weight loss medication if prediabetic.  Screening for colon cancer History of precancerous polyps, family history of colon cancer. Last colonoscopy four years ago. - Referred to gastrointestinal specialist for colonoscopy.   Hyperlipidemia Managed with Crestor . Lipid panel not done recently. - Ordered lipid panel.  Hypertension Well controlled with diltiazem , hydrochlorothiazide , irbesartan , spironolactone . Blood pressure 120/78 mmHg.  Coronary artery disease with history of stent placement Managed with Plavix . No aspirin  therapy. Cardiologist follow-up in place. - Continue Plavix .  Follow up in 10-14 days or sooner if needed

## 2024-11-01 LAB — URINE CULTURE
MICRO NUMBER:: 17547706
SPECIMEN QUALITY:: ADEQUATE

## 2024-11-01 MED ORDER — AMOXICILLIN 875 MG PO TABS: 875.0000 mg | ORAL_TABLET | Freq: Two times a day (BID) | ORAL | 0 refills | Status: AC

## 2024-11-01 NOTE — Progress Notes (Signed)
 Pt called and notified of all lab results fu apt 11/13/2024

## 2024-11-01 NOTE — Addendum Note (Signed)
 Addended by: DORINA DALLAS DORINA PA-C M on: 11/01/2024 08:32 AM   Modules accepted: Orders

## 2024-11-11 ENCOUNTER — Ambulatory Visit (HOSPITAL_BASED_OUTPATIENT_CLINIC_OR_DEPARTMENT_OTHER): Admitting: Physical Therapy

## 2024-11-13 ENCOUNTER — Ambulatory Visit: Admitting: Medical

## 2024-11-18 ENCOUNTER — Ambulatory Visit (HOSPITAL_BASED_OUTPATIENT_CLINIC_OR_DEPARTMENT_OTHER): Admitting: Physical Therapy

## 2024-11-25 ENCOUNTER — Ambulatory Visit (HOSPITAL_BASED_OUTPATIENT_CLINIC_OR_DEPARTMENT_OTHER): Admitting: Physical Therapy

## 2024-12-02 ENCOUNTER — Ambulatory Visit (HOSPITAL_BASED_OUTPATIENT_CLINIC_OR_DEPARTMENT_OTHER): Admitting: Physical Therapy

## 2024-12-09 ENCOUNTER — Ambulatory Visit (HOSPITAL_BASED_OUTPATIENT_CLINIC_OR_DEPARTMENT_OTHER)

## 2025-01-22 ENCOUNTER — Ambulatory Visit
